# Patient Record
Sex: Male | Born: 1935
Health system: Southern US, Community
[De-identification: ages and names within clinical notes are randomized; demographics above are authoritative.]

## PROBLEM LIST (undated history)

## (undated) ENCOUNTER — Emergency Department (HOSPITAL_COMMUNITY): Payer: Medicare HMO | Source: Home / Self Care

## (undated) DIAGNOSIS — T8859XA Other complications of anesthesia, initial encounter: Secondary | ICD-10-CM

## (undated) DIAGNOSIS — K219 Gastro-esophageal reflux disease without esophagitis: Secondary | ICD-10-CM

## (undated) DIAGNOSIS — R112 Nausea with vomiting, unspecified: Secondary | ICD-10-CM

## (undated) DIAGNOSIS — J449 Chronic obstructive pulmonary disease, unspecified: Secondary | ICD-10-CM

## (undated) DIAGNOSIS — T4145XA Adverse effect of unspecified anesthetic, initial encounter: Secondary | ICD-10-CM

## (undated) DIAGNOSIS — I10 Essential (primary) hypertension: Secondary | ICD-10-CM

## (undated) DIAGNOSIS — S37069A Major laceration of unspecified kidney, initial encounter: Secondary | ICD-10-CM

## (undated) DIAGNOSIS — M1990 Unspecified osteoarthritis, unspecified site: Secondary | ICD-10-CM

## (undated) DIAGNOSIS — J069 Acute upper respiratory infection, unspecified: Secondary | ICD-10-CM

## (undated) DIAGNOSIS — I6529 Occlusion and stenosis of unspecified carotid artery: Secondary | ICD-10-CM

## (undated) DIAGNOSIS — E785 Hyperlipidemia, unspecified: Secondary | ICD-10-CM

## (undated) DIAGNOSIS — Z9889 Other specified postprocedural states: Secondary | ICD-10-CM

## (undated) DIAGNOSIS — R0602 Shortness of breath: Secondary | ICD-10-CM

## (undated) DIAGNOSIS — I639 Cerebral infarction, unspecified: Secondary | ICD-10-CM

## (undated) HISTORY — PX: JOINT REPLACEMENT: SHX530

## (undated) HISTORY — PX: SHOULDER SURGERY: SHX246

## (undated) HISTORY — PX: KIDNEY SURGERY: SHX687

## (undated) HISTORY — DX: Essential (primary) hypertension: I10

## (undated) HISTORY — PX: APPENDECTOMY: SHX54

## (undated) HISTORY — PX: ELBOW SURGERY: SHX618

## (undated) HISTORY — DX: Chronic obstructive pulmonary disease, unspecified: J44.9

## (undated) HISTORY — DX: Cerebral infarction, unspecified: I63.9

## (undated) HISTORY — DX: Hyperlipidemia, unspecified: E78.5

## (undated) HISTORY — DX: Occlusion and stenosis of unspecified carotid artery: I65.29

## (undated) HISTORY — DX: Gastro-esophageal reflux disease without esophagitis: K21.9

---

## 1997-09-26 ENCOUNTER — Emergency Department (HOSPITAL_COMMUNITY): Admission: EM | Admit: 1997-09-26 | Discharge: 1997-09-26 | Payer: Self-pay

## 1998-11-12 ENCOUNTER — Encounter: Payer: Self-pay | Admitting: Emergency Medicine

## 1998-11-12 ENCOUNTER — Inpatient Hospital Stay (HOSPITAL_COMMUNITY): Admission: EM | Admit: 1998-11-12 | Discharge: 1998-11-15 | Payer: Self-pay | Admitting: Emergency Medicine

## 1998-11-13 ENCOUNTER — Encounter: Payer: Self-pay | Admitting: Orthopedic Surgery

## 1999-02-17 ENCOUNTER — Encounter: Payer: Self-pay | Admitting: Orthopedic Surgery

## 1999-02-17 ENCOUNTER — Ambulatory Visit (HOSPITAL_COMMUNITY): Admission: RE | Admit: 1999-02-17 | Discharge: 1999-02-17 | Payer: Self-pay | Admitting: Orthopedic Surgery

## 1999-04-26 ENCOUNTER — Encounter: Payer: Self-pay | Admitting: Orthopedic Surgery

## 1999-04-26 ENCOUNTER — Ambulatory Visit (HOSPITAL_COMMUNITY): Admission: RE | Admit: 1999-04-26 | Discharge: 1999-04-26 | Payer: Self-pay | Admitting: Orthopedic Surgery

## 1999-06-28 ENCOUNTER — Encounter: Payer: Self-pay | Admitting: Orthopedic Surgery

## 1999-07-01 ENCOUNTER — Encounter: Payer: Self-pay | Admitting: Orthopedic Surgery

## 1999-07-01 ENCOUNTER — Inpatient Hospital Stay (HOSPITAL_COMMUNITY): Admission: RE | Admit: 1999-07-01 | Discharge: 1999-07-03 | Payer: Self-pay | Admitting: Orthopedic Surgery

## 1999-11-04 ENCOUNTER — Encounter (INDEPENDENT_AMBULATORY_CARE_PROVIDER_SITE_OTHER): Payer: Self-pay | Admitting: Specialist

## 1999-11-04 ENCOUNTER — Other Ambulatory Visit: Admission: RE | Admit: 1999-11-04 | Discharge: 1999-11-04 | Payer: Self-pay | Admitting: Internal Medicine

## 2001-01-23 ENCOUNTER — Ambulatory Visit (HOSPITAL_COMMUNITY): Admission: RE | Admit: 2001-01-23 | Discharge: 2001-01-23 | Payer: Self-pay | Admitting: Internal Medicine

## 2001-01-23 ENCOUNTER — Encounter: Payer: Self-pay | Admitting: Internal Medicine

## 2001-07-18 ENCOUNTER — Ambulatory Visit (HOSPITAL_COMMUNITY): Admission: RE | Admit: 2001-07-18 | Discharge: 2001-07-18 | Payer: Self-pay | Admitting: Orthopedic Surgery

## 2001-07-18 ENCOUNTER — Encounter: Payer: Self-pay | Admitting: Orthopedic Surgery

## 2004-09-29 ENCOUNTER — Ambulatory Visit: Payer: Self-pay | Admitting: Internal Medicine

## 2004-10-26 ENCOUNTER — Ambulatory Visit: Payer: Self-pay | Admitting: Internal Medicine

## 2006-10-18 ENCOUNTER — Encounter: Admission: RE | Admit: 2006-10-18 | Discharge: 2006-10-18 | Payer: Self-pay | Admitting: Internal Medicine

## 2007-04-19 ENCOUNTER — Encounter: Admission: RE | Admit: 2007-04-19 | Discharge: 2007-04-19 | Payer: Self-pay | Admitting: Internal Medicine

## 2008-02-06 ENCOUNTER — Encounter: Admission: RE | Admit: 2008-02-06 | Discharge: 2008-02-06 | Payer: Self-pay | Admitting: Interventional Radiology

## 2008-08-15 IMAGING — US EM OFFICE/OP CONSULT LEVEL 3 (40)
1 series · 13 of 16 positions shown · non-contrast
Comparison: none

[Series 1: em office/op consult level 3 (40) · 13 of 52 slices shown]
[im 1/52]
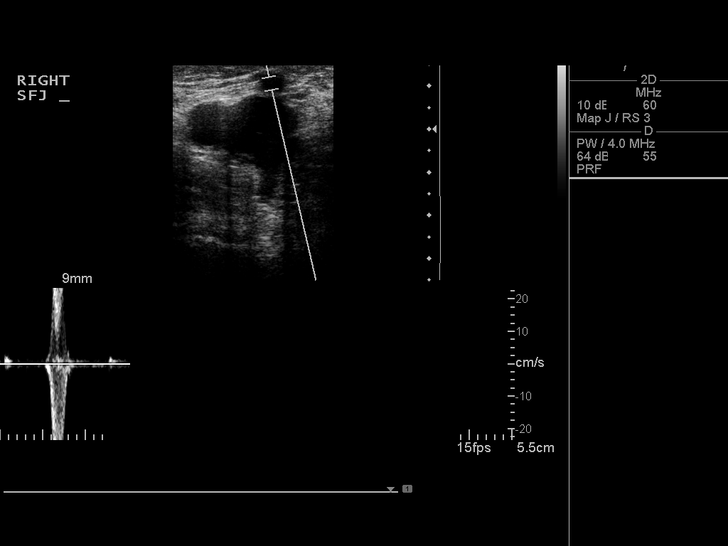
[im 4/52]
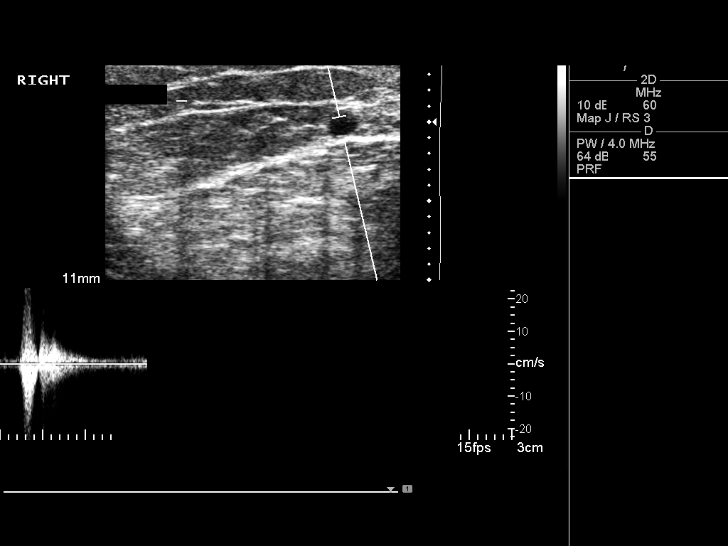
[im 11/52]
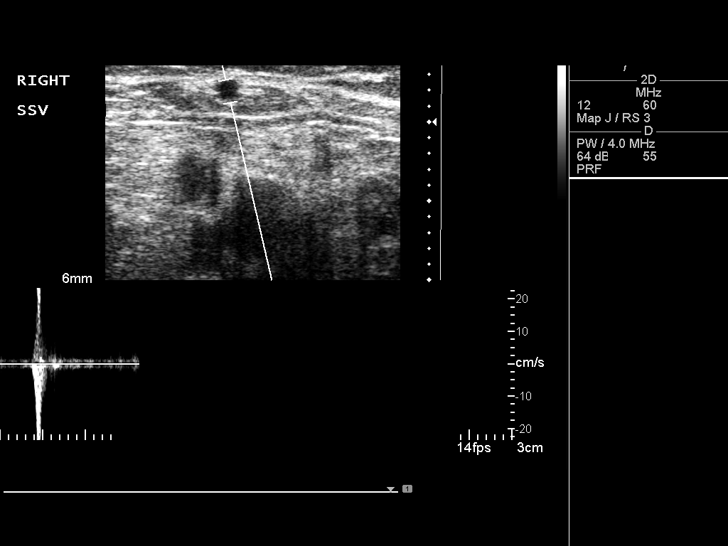
[im 14/52]
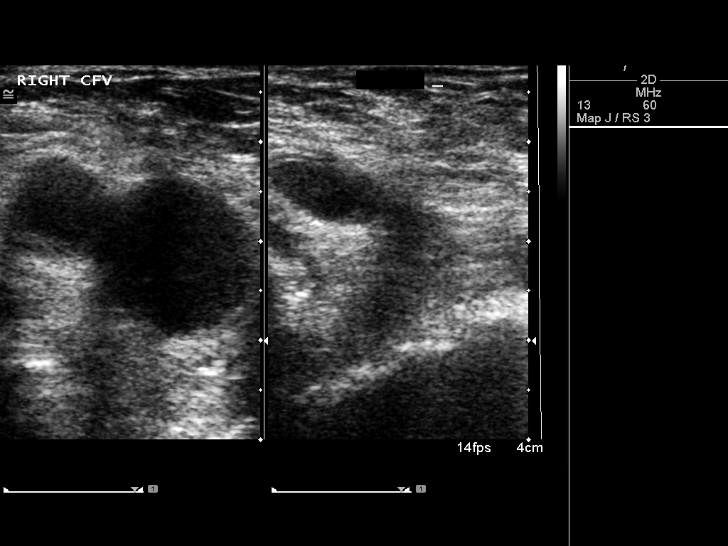
[im 18/52]
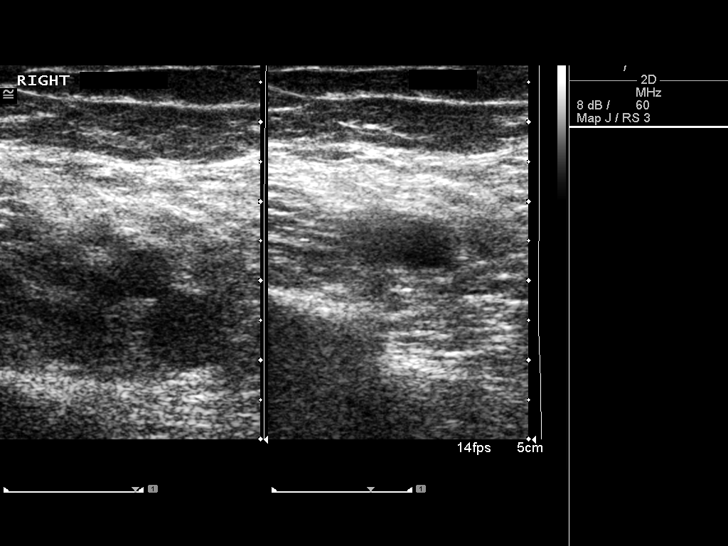
[im 21/52]
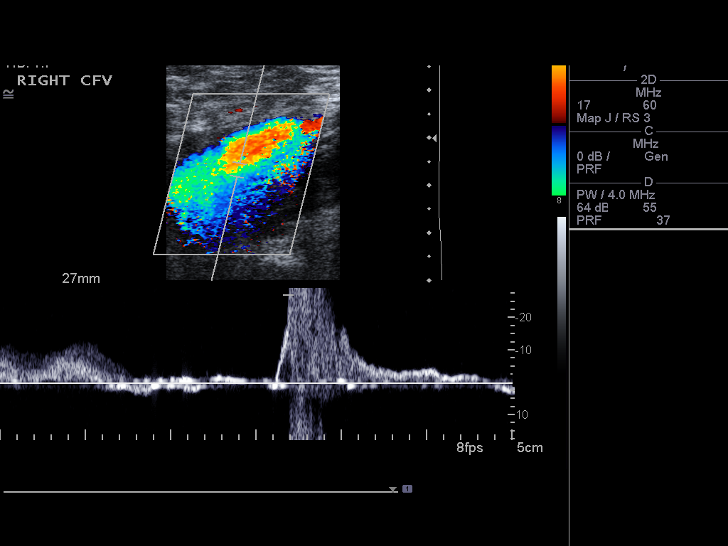
[im 28/52]
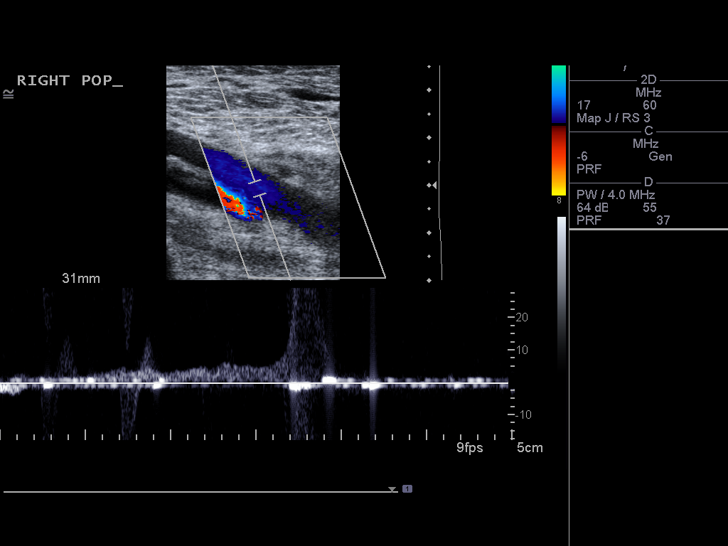
[im 31/52]
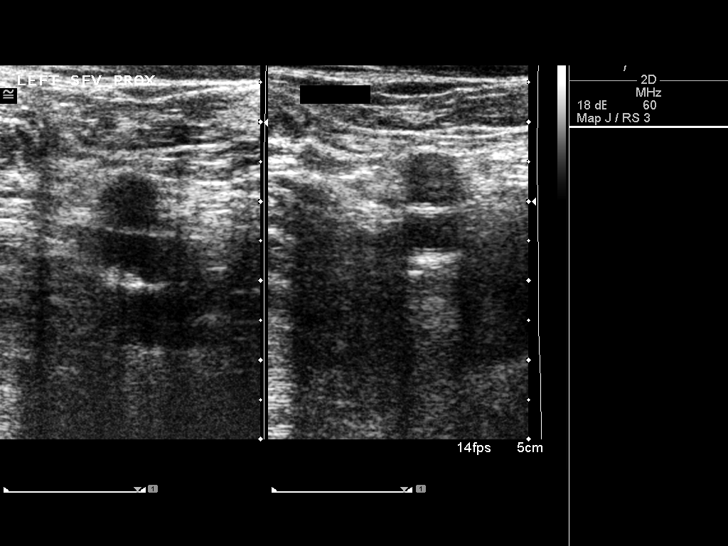
[im 35/52]
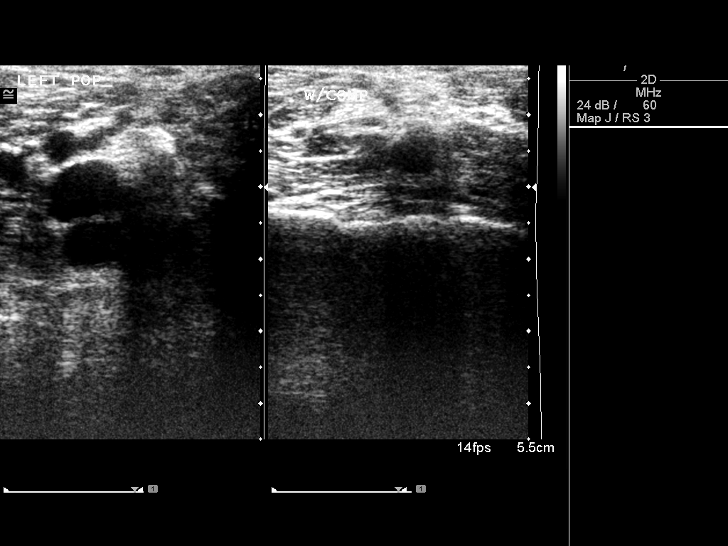
[im 38/52]
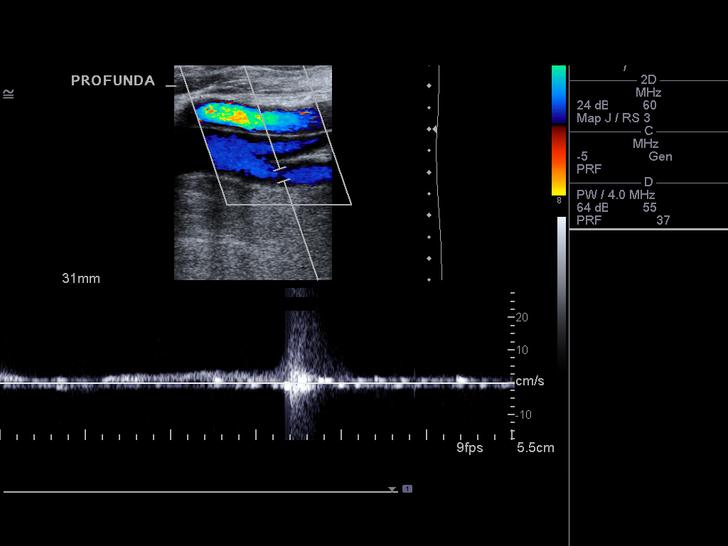
[im 41/52]
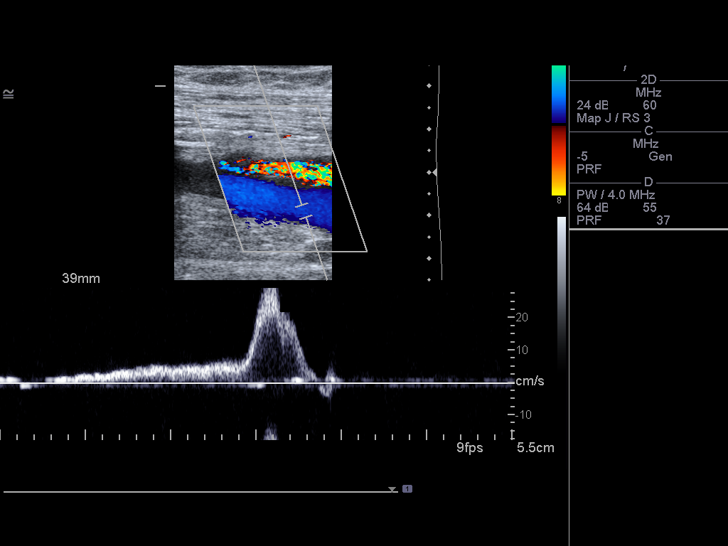
[im 48/52]
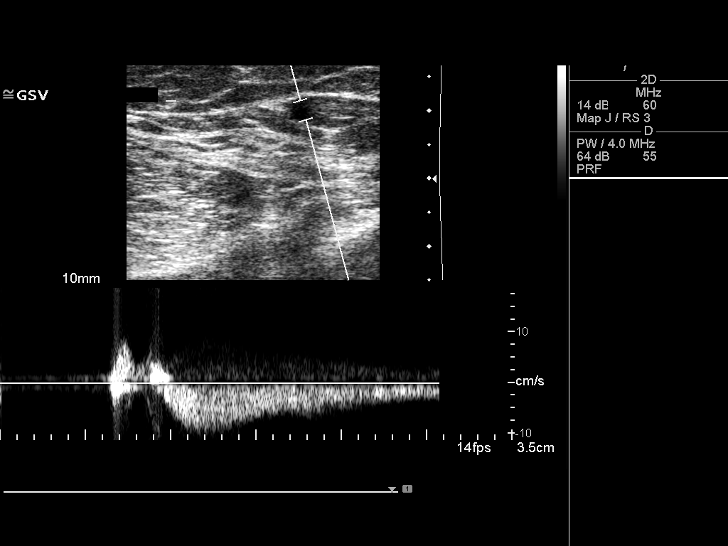
[im 52/52]
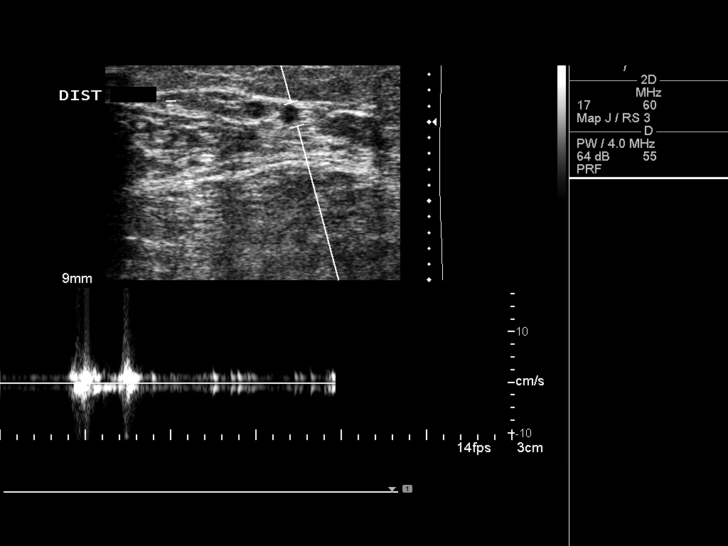

[13 of 16 positions shown; findings below may reference images not displayed]

Outpatient Consultation - [DATE]

Antonio F Negrini, M.D.
[address]

RE:  Lenard Fletes (DOB - [birth year])

Dear Dr. Tiger: 

Thanks for your referral of Mr. Ursula for evaluation and possible treatment for lower extremity varicose veins.  As you, Mr. Ursula is a pleasant 72 year old retired postal worker, who describes a several year history of a prominent varicose veins in the back of his right thigh.  The patient states that over the last couple of weeks, he has noticed a new prominent bulging vein in the medial aspect his left calf.  The patient denies symptoms related to the varicose veins.  The patient denies leg pain, swelling, or tiredness.  The patient states he occasionally has night cramps, but has had them all his life.  The patient?s primary concern is the possibility of blood clots in his legs.  

The patient underwent bilateral lower extremity ultrasounds which are dictated in a separate report.  In short, this shows extensive reflux in both lower extremities, involving both great saphenous veins, portions of both short saphenous veins, perforators in the calves bilaterally and actually also some reflux in the deep venous system in the left leg in the region of the distal femoral vein and popliteal vein.  Given the amount of reflux seen by ultrasound, I am surprised the patient is not more symptomatic.  

I discussed these results in depth with the patient and different options for treatment which include laser treatment or conservative management.  Given the patient?s extensive disease and the lack of symptoms, I recommended to him a watch-and-wait approach.  He would require extensive laser therapy of both great and short saphenous veins and still may continue to have problems related to perforator veins and the deep venous reflux in the left leg.  Therefore, I prescribed the [REDACTED] - 30 mm Hg graduated compression hose and told him to wear those as much as possible to help prevent progression of his disease.  I did instruct the patient to give us a call if he begins to have more symptoms including pain or leg swelling or worsening varicose veins.  

Again thank you for your referral of Mr. Ursula to me for evaluation and possible treatment.  On follow-up visits, if there is any concern for progression of disease or worsening symptoms, please do not hesitate to contact me as we can rediscuss treatment at that time.  

I spent approximately 45 minutes in direct consultation with the patient. 

Sincerely,

KGD:chc

## 2008-08-28 ENCOUNTER — Encounter: Admission: RE | Admit: 2008-08-28 | Discharge: 2008-08-28 | Payer: Self-pay | Admitting: Internal Medicine

## 2008-09-10 ENCOUNTER — Encounter: Admission: RE | Admit: 2008-09-10 | Discharge: 2008-09-10 | Payer: Self-pay | Admitting: Internal Medicine

## 2009-05-25 DIAGNOSIS — E785 Hyperlipidemia, unspecified: Secondary | ICD-10-CM | POA: Insufficient documentation

## 2009-05-25 DIAGNOSIS — I10 Essential (primary) hypertension: Secondary | ICD-10-CM | POA: Insufficient documentation

## 2009-09-21 ENCOUNTER — Encounter: Payer: Self-pay | Admitting: Internal Medicine

## 2009-10-23 ENCOUNTER — Encounter: Payer: Self-pay | Admitting: Internal Medicine

## 2010-02-15 ENCOUNTER — Encounter
Admission: RE | Admit: 2010-02-15 | Discharge: 2010-02-15 | Payer: Self-pay | Source: Home / Self Care | Attending: Internal Medicine | Admitting: Internal Medicine

## 2010-02-24 DIAGNOSIS — I69959 Hemiplegia and hemiparesis following unspecified cerebrovascular disease affecting unspecified side: Secondary | ICD-10-CM | POA: Insufficient documentation

## 2010-03-02 ENCOUNTER — Ambulatory Visit: Admit: 2010-03-02 | Payer: Self-pay | Attending: Vascular Surgery | Admitting: Vascular Surgery

## 2010-03-02 ENCOUNTER — Ambulatory Visit
Admission: RE | Admit: 2010-03-02 | Discharge: 2010-03-02 | Payer: Self-pay | Source: Home / Self Care | Attending: Vascular Surgery | Admitting: Vascular Surgery

## 2010-03-30 NOTE — Procedures (Signed)
Summary: Recall Assessment/[facility]  Recall Assessment/[facility]   Imported By: Sherian Rein 10/23/2009 10:50:27  _____________________________________________________________________  External Attachment:    Type:   Image     Comment:   External Document

## 2010-03-30 NOTE — Letter (Signed)
Summary: Colonoscopy Date Change Letter  [facility]  [address] [place], [place] [address]   Phone: [phone]  Fax: [phone]      October 23, 2009 MRN: [id number]   KIMO BANCROFT [address] [place], [place]  [address]   Dear Mr. GROLEAU,   Previously you were recommended to have a repeat colonoscopy around this time. Your chart was recently reviewed by DR.Jeanne Terrance of  Gastroenterology. Follow up colonoscopy is now recommended in 09-2014. This revised recommendation is based on current, nationally recognized guidelines for colorectal cancer screening and polyp surveillance. These guidelines are endorsed by the American Cancer Society, [employer] on Colorectal Cancer as well as numerous other major medical organizations.  Please understand that our recommendation assumes that you do not have any new symptoms such as bleeding, a change in bowel habits, anemia, or significant abdominal discomfort. If you do have any concerning GI symptoms or want to discuss the guideline recommendations, please call to arrange an office visit at your earliest convenience. Otherwise we will keep you in our reminder system and contact you 1-2 months prior to the date listed above to schedule your next colonoscopy.  Thank you,  Wilhemina Bonito. Marina Goodell, M.D.  [facility]astroenterology Division [phone]

## 2010-07-13 NOTE — Procedures (Signed)
CAROTID DUPLEX EXAM   INDICATION:  Carotid stenosis, per standing order.   HISTORY:  Diabetes:  No  Cardiac:  No  Hypertension:  Yes  Smoking:  Previous  Previous Surgery:  No  CV History:  History of CVA  Amaurosis Fugax No, Paresthesias No, Hemiparesis No                                       RIGHT             LEFT  Brachial systolic pressure:         120               120  Brachial Doppler waveforms:         Normal            Normal  Vertebral direction of flow:        Antegrade         Antegrade  DUPLEX VELOCITIES (cm/sec)  CCA peak systolic                   89                100  ECA peak systolic                   75                108  ICA peak systolic                   68                245  ICA end diastolic                   20                71  PLAQUE MORPHOLOGY:                  Heterogeneous     Mixed  PLAQUE AMOUNT:                      Mild              Moderate/severe  PLAQUE LOCATION:                    ICA/ECA           ICA/distal CCA   IMPRESSION:  1. Doppler velocities suggest a 60% to 79% stenosis at the left      proximal internal carotid artery.  2. No hemodynamically significant stenosis of the right internal      carotid artery with mild known irregular plaque noted as described      above.   ___________________________________________  Quita Skye. Hart Rochester, M.D.   CH/MEDQ  D:  03/03/2010  T:  03/03/2010  Job:  [id number]

## 2010-07-13 NOTE — Consult Note (Signed)
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  He awoke the following morning with weakness and  clumsiness in the left leg which has persisted but has slightly  improved.  He had no other neurologic symptoms which he noted.  He has  no history of hemiparesis, aphasia, amaurosis fugax, diplopia, blurred  vision, syncope or other neurologic symptoms or previous stroke.  Workup  included an MRI which revealed a stroke in the right parietal area and  carotid duplex studies were performed at [facility]  which I have reviewed and this revealed an approximate 70% to 75% left  internal carotid stenosis with mild flow reduction in the right internal  carotid.  He was referred for further evaluation.  He has no history of  previous neurologic symptoms.   CHRONIC MEDICAL PROBLEMS:  1. Hypertension.  2. Hyperlipidemia.  3. COPD.  4. GERD.  5. Negative for coronary artery disease or diabetes.   SOCIAL HISTORY:  The patient is married, has 1 child.  He is retired.  Does not use tobacco, has not in 20 years.  Drinks an occasional beer.   FAMILY HISTORY:  Positive for coronary artery disease in his mother.  Negative for diabetes and stroke.   REVIEW OF SYSTEMS:  Positive for decreasing visual acuity not  unilateral, joint pain, muscle pain, dyspnea on exertion and urinary  frequency.  All other systems negative on the complete review of  systems.   PHYSICAL EXAMINATION:  Vital signs:  Blood pressure 135/78, heart rate  68, respiration 20.  General:  This is a well-developed, well-nourished  male in no apparent distress, alert and oriented x3.  HEENT:  Exam is  normal for age.  EOMs intact.  Lungs:  Clear to auscultation.  No  rhonchi or wheezing.  Cardiovascular:  Regular  rhythm.  No murmurs.  Carotid pulses are 3+.  No bruits are audible.  Abdomen:  Soft,  nontender with no masses.  Musculoskeletal:  Free of major deformities.  Neurologic:  Reveals a very subtle weakness and discoordination of the  left lower extremity.  Lower extremity:  Exam reveals 3+ femoral and  posterior tibial pulses.  Skin:  Free of rashes.   Today I ordered a carotid duplex exam which I reviewed and interpreted.  I feel that he does have a moderate left internal carotid stenosis  approximating 70%.  I wanted to look at the morphology of the right  internal carotid plaque which is not flow reducing and does not appear  to be ulcerated or irregular.   IMPRESSION:  1. Recent right brain cerebrovascular accident not related to right      carotid disease which is quite mild.  2. Asymptomatic moderate left internal carotid stenosis.   I think he should continue his aspirin therapy as previously prescribed.  We will see him in 1 year with a followup carotid duplex exam to follow  his left side unless he develops any new symptoms in the interim.     Quita Skye Hart Rochester, M.D.  Electronically Signed   JDL/MEDQ  D:  03/02/2010  T:  03/02/2010  Job:  [id number]   cc:   Kari Baars, M.D.  Pramod P. Pearlean Brownie, MD

## 2010-07-16 NOTE — Op Note (Signed)
[place]  Patient:    Robert Bowers, Robert Bowers                        MRN: [id number] Proc. Date: 07/02/99 Adm. Date:  [id number] Disc. Date: [id number] Attending:  Burnard Bunting                           Operative Report  PREOPERATIVE DIAGNOSIS:  Left elbow pain with flexion contracture.  POSTOPERATIVE DIAGNOSIS:  Left elbow pain with flexion contracture.  OPERATION PERFORMED:  Left elbow examination under anesthesia, radial head excision and anterior capsule release and inspection of fractures and evaluation of fracture site.  SURGEON:  Cammy Copa, M.D.  ASSISTANT:  Nadara Mustard, M.D.  ANESTHESIA:  General endotracheal.  ESTIMATED BLOOD LOSS:  5 cc.  DRAINS:  None.  INDICATIONS FOR PROCEDURE:  The patient is a 76 year old patient who is now about 5-1/2 months out from left elbow and a radial head fracture.  He has had persistent elbow pain and has developed an elbow flexion contracture.  The patient has had lucency at the coranoid fracture site.  This has been evaluated by two CT scans which have demonstrated interval healing.  Injection into the elbow has given him less than complete pain relief.  OPERATIVE FINDINGS: 1. Preop range of motion 45 to 135 with 70 degree supination, 5 to 10 degrees    of pronation on the left elbow.  DESCRIPTION OF PROCEDURE:  The patient was brought to the operating room where general endotracheal anesthesia was induced.  Preoperative intravenous antibiotics were administered.  The left elbow was then prepped with Betadine and DuraPrep solution and covered with sterile Ioban.  The arm was elevated and exsanguinated with the Esmarch wrap and the tourniquet was inflated.  A lateral Kocher approach to the elbow was made.  Extensor mass was elevated off the lateral condyle.  The radial head was exposed.  Although the fracture had healed, the radial head was deformed and was mechanical block to pronation,  / the radial head was excised with an oscillating saw.  Two baby Bennett retractors were gently placed around the proximal radial neck.  The radial head was excised with an oscillating saw.  At this time an anterior capsular release was performed using a periosteal elevator.  The release was carried across the ulnar humeral joint over to the medial aspect of the elbow joint. The radial head removed.  The coranoid fracture line was inspected.  The elbow was taken through a range of motion and no movement at the fracture site was identified and the fracture site was probed and was found to have bone crossing the fracture site.  At this point the posterior aspect of the elbow joint was inspected.  There was noted to be scar tissue within the olecranon fossa.  This was removed.  The tip of the olecranon was also removed which was preventing full extension.  After these maneuvers were then performed, the patient could achieve improved flexion contracture preoperatively from 45 to about to 5 to 10 degrees against gravity.  The arm could be made straight with gentle pressure applied over the proximal forearm.  Pronation and supination was full.  Intraoperative fluoroscopy demonstrated excision of the radial head and no change in the fracture fragment position.  The incision was then thoroughly irrigated with 2L of irrigating solution.  The Alpha 2000 Marcaine pump  was placed into the joint.  The extensor tendon mass was then reapproximated to the lateral epicondyle using interrupted 0 Vicryl figure-of-eight sutures.  The skin and subcutaneous tissue was then reapproximated using interrupted 3-0 Vicryl suture followed by interrupted simple Prolene suture.  The patient was then placed in an elbow splint with the arm in full extension.  Tourniquet was released after the cast padding was applied.  Postoperatively the patient was noted to have intact posterior interosseous nerve function and a perfused  hand.  The patient tolerated the procedure well without immediate complications. DD:  07/02/99 TD:  07/05/99 Job: [id number]

## 2010-08-17 ENCOUNTER — Ambulatory Visit: Payer: Medicare Other | Attending: Specialist

## 2010-08-17 DIAGNOSIS — M25659 Stiffness of unspecified hip, not elsewhere classified: Secondary | ICD-10-CM | POA: Insufficient documentation

## 2010-08-17 DIAGNOSIS — IMO0001 Reserved for inherently not codable concepts without codable children: Secondary | ICD-10-CM | POA: Insufficient documentation

## 2010-08-17 DIAGNOSIS — M545 Low back pain, unspecified: Secondary | ICD-10-CM | POA: Insufficient documentation

## 2010-08-17 DIAGNOSIS — R5381 Other malaise: Secondary | ICD-10-CM | POA: Insufficient documentation

## 2010-08-27 ENCOUNTER — Ambulatory Visit: Payer: Medicare Other | Admitting: Physical Therapy

## 2010-09-20 DIAGNOSIS — M549 Dorsalgia, unspecified: Secondary | ICD-10-CM | POA: Insufficient documentation

## 2011-05-31 ENCOUNTER — Ambulatory Visit: Payer: Medicare Other | Admitting: Vascular Surgery

## 2011-05-31 ENCOUNTER — Other Ambulatory Visit: Payer: Medicare Other

## 2011-06-03 ENCOUNTER — Encounter: Payer: Self-pay | Admitting: Vascular Surgery

## 2011-06-06 ENCOUNTER — Encounter: Payer: Self-pay | Admitting: Neurosurgery

## 2011-06-07 ENCOUNTER — Ambulatory Visit (INDEPENDENT_AMBULATORY_CARE_PROVIDER_SITE_OTHER): Payer: Medicare Other | Admitting: Neurosurgery

## 2011-06-07 ENCOUNTER — Ambulatory Visit (INDEPENDENT_AMBULATORY_CARE_PROVIDER_SITE_OTHER): Payer: Medicare Other | Admitting: *Deleted

## 2011-06-07 ENCOUNTER — Encounter: Payer: Self-pay | Admitting: Neurosurgery

## 2011-06-07 VITALS — BP 125/72 | HR 67 | Resp 16 | Ht 72.0 in | Wt 180.7 lb

## 2011-06-07 DIAGNOSIS — I6529 Occlusion and stenosis of unspecified carotid artery: Secondary | ICD-10-CM

## 2011-06-07 NOTE — Progress Notes (Signed)
[facility] HISTORY AND PHYSICAL   CC: Annual carotid duplex exam known carotid stenosis Referring Physician: Hart Rochester  History of Present Illness: This 77 year old patient of Dr. Hart Rochester seen for his known carotid stenosis. Last duplex was January 2012. Patient did have a CVA not due to carotid stenosis in January 2011. Does note practically full recovery except for some minor problems with his left lower extremity from time to time. 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  Past Medical History  Diagnosis Date  . Hypertension   . Hyperlipidemia   . Stroke   . Carotid artery occlusion   . COPD (chronic obstructive pulmonary disease)   . GERD (gastroesophageal reflux disease)     ROS: [x]  Positive   [ ]  Denies    General: [ ]  Weight loss, [ ]  Fever, [ ]  chills Neurologic: [ ]  Dizziness, [ ]  Blackouts, [ ]  Seizure [ ]  Stroke, [ ]  "Mini stroke", [ ]  Slurred speech, [ ]  Temporary blindness; [ ]  weakness in arms or legs, [ ]  Hoarseness Cardiac: [ ]  Chest pain/pressure, [ ]  Shortness of breath at rest [ ]  Shortness of breath with exertion, [ ]  Atrial fibrillation or irregular heartbeat Vascular: [ ]  Pain in legs with walking, [ ]  Pain in legs at rest, [ ]  Pain in legs at night,  [ ]  Non-healing ulcer, [ ]  Blood clot in vein/DVT,   Pulmonary: [ ]  Home oxygen, [ ]  Productive cough, [ ]  Coughing up blood, [ ]  Asthma,  [ ]  Wheezing Musculoskeletal:  [ ]  Arthritis, [ ]  Low back pain, [ ]  Joint pain Hematologic: [ ]  Easy Bruising, [ ]  Anemia; [ ]  Hepatitis Gastrointestinal: [ ]  Blood in stool, [ ]  Gastroesophageal Reflux/heartburn, [ ]  Trouble swallowing Urinary: [ ]  chronic Kidney disease, [ ]  on HD - [ ]  MWF or [ ]  TTHS, [ ]  Burning with urination, [ ]  Difficulty urinating Skin: [ ]  Rashes, [ ]  Wounds Psychological: [ ]  Anxiety, [ ]  Depression   Social  History History  Substance Use Topics  . Smoking status: Former Smoker    Quit date: 06/03/1991  . Smokeless tobacco: Not on file  . Alcohol Use: No    Family History Family History  Problem Relation Age of Onset  . Heart disease Mother     Heart Disease before age 57  . Hypertension Mother     No Known Allergies  Current Outpatient Prescriptions  Medication Sig Dispense Refill  . amLODipine (NORVASC) 5 MG tablet Take 5 mg by mouth daily.      Marland Kitchen aspirin 325 MG tablet Take 325 mg by mouth daily.      Marland Kitchen omeprazole (PRILOSEC) 20 MG capsule Take 20 mg by mouth daily.      Marland Kitchen tiotropium (SPIRIVA) 18 MCG inhalation capsule Place 18 mcg into inhaler and inhale daily.      . benazepril (LOTENSIN) 20 MG tablet Take 20 mg by mouth daily.      Marland Kitchen ibuprofen (ADVIL,MOTRIN) 200 MG tablet Take 200 mg by mouth every 6 (six) hours as needed.      . sildenafil (VIAGRA) 100 MG tablet Take 50 mg by mouth daily as needed.        Physical Examination  Filed Vitals:   06/07/11 1415  BP: 125/72  Pulse: 67  Resp: 16    Body mass index is  24.51 kg/(m^2).  General:  WDWN in NAD Gait: Normal HEENT: WNL Eyes: Pupils equal Pulmonary: normal non-labored breathing , without Rales, rhonchi,  wheezing Cardiac: RRR, without  Murmurs, rubs or gallops; Abdomen: soft, NT, no masses Skin: no rashes, ulcers noted  Vascular Exam Pulses: The patient has 2+ radial pulses bilaterally Carotid bruits are not audible to auscultation, he does have audible pulses bilaterally in the carotids Extremities without ischemic changes, no Gangrene , no cellulitis; no open wounds;  Musculoskeletal: no muscle wasting or atrophy   Neurologic: A&O X 3; Appropriate Affect ; SENSATION: normal; MOTOR FUNCTION:  moving all extremities equally. Speech is fluent/normal  Non-Invasive Vascular Imaging CAROTID DUPLEX 06/07/2011  Right ICA 20 - 39 % stenosis Left ICA 80 - 99 % stenosis January 2012 carotid duplex shows 139% on  the right, 60-79% on the left which is increased significantly.  ASSESSMENT/PLAN: Assessment above, Dr. Hart Rochester spoke with the patient and his wife who agree the patient will have a left carotid endarterectomy May 1 with Dr. Hart Rochester. Their questions were encouraged and answered in the surgery schedulers will take care of his surgery set up.  Webb Silversmith ANP   Clinic MD: Hart Rochester

## 2011-06-15 ENCOUNTER — Encounter (HOSPITAL_COMMUNITY): Payer: Self-pay | Admitting: Pharmacy Technician

## 2011-06-15 ENCOUNTER — Other Ambulatory Visit: Payer: Self-pay | Admitting: *Deleted

## 2011-06-21 ENCOUNTER — Encounter (HOSPITAL_COMMUNITY)
Admission: RE | Admit: 2011-06-21 | Discharge: 2011-06-21 | Disposition: A | Discharge status: Home / Self Care | Payer: Medicare Other | Source: Ambulatory Visit | Attending: Anesthesiology | Admitting: Anesthesiology

## 2011-06-21 ENCOUNTER — Encounter (HOSPITAL_COMMUNITY)
Admission: RE | Admit: 2011-06-21 | Discharge: 2011-06-21 | Disposition: A | Discharge status: Home / Self Care | Payer: Medicare Other | Source: Ambulatory Visit | Attending: Vascular Surgery | Admitting: Vascular Surgery

## 2011-06-21 ENCOUNTER — Encounter (HOSPITAL_COMMUNITY): Payer: Self-pay

## 2011-06-21 ENCOUNTER — Other Ambulatory Visit (HOSPITAL_COMMUNITY): Payer: Medicare Other

## 2011-06-21 HISTORY — DX: Shortness of breath: R06.02

## 2011-06-21 HISTORY — DX: Adverse effect of unspecified anesthetic, initial encounter: T41.45XA

## 2011-06-21 HISTORY — DX: Other specified postprocedural states: Z98.890

## 2011-06-21 HISTORY — DX: Acute upper respiratory infection, unspecified: J06.9

## 2011-06-21 HISTORY — DX: Other complications of anesthesia, initial encounter: T88.59XA

## 2011-06-21 HISTORY — DX: Other specified postprocedural states: R11.2

## 2011-06-21 LAB — CBC
HCT: 44.6 % (ref 39.0–52.0)
Hemoglobin: 15.4 g/dL (ref 13.0–17.0)
MCH: 29 pg (ref 26.0–34.0)
MCHC: 34.5 g/dL (ref 30.0–36.0)
RBC: 5.31 MIL/uL (ref 4.22–5.81)

## 2011-06-21 LAB — COMPREHENSIVE METABOLIC PANEL
ALT: 27 U/L (ref 0–53)
Alkaline Phosphatase: 97 U/L (ref 39–117)
BUN: 11 mg/dL (ref 6–23)
CO2: 28 mEq/L (ref 19–32)
Calcium: 9.8 mg/dL (ref 8.4–10.5)
GFR calc Af Amer: 69 mL/min — ABNORMAL LOW (ref >90)
GFR calc non Af Amer: 60 mL/min — ABNORMAL LOW (ref >90)
Glucose, Bld: 99 mg/dL (ref 70–99)
Potassium: 3.7 mEq/L (ref 3.5–5.1)
Sodium: 141 mEq/L (ref 135–145)
Total Protein: 7.8 g/dL (ref 6.0–8.3)

## 2011-06-21 LAB — URINALYSIS, ROUTINE W REFLEX MICROSCOPIC
Bilirubin Urine: NEGATIVE
Glucose, UA: NEGATIVE mg/dL
Hgb urine dipstick: NEGATIVE
Ketones, ur: NEGATIVE mg/dL
Protein, ur: NEGATIVE mg/dL
pH: 6.5 (ref 5.0–8.0)

## 2011-06-21 LAB — ABO/RH: ABO/RH(D): O POS

## 2011-06-21 LAB — APTT: aPTT: 29 seconds (ref 24–37)

## 2011-06-21 LAB — TYPE AND SCREEN: ABO/RH(D): O POS

## 2011-06-21 LAB — PROTIME-INR: Prothrombin Time: 13.1 seconds (ref 11.6–15.2)

## 2011-06-21 NOTE — Pre-Procedure Instructions (Signed)
Robert Bowers  06/21/2011   Your procedure is scheduled on:  Wednesday, May 1st @8 :30AM.  Report to [facility] at 6:30 AM.  Call this number if you have problems the morning of surgery: [phone]   Remember:   Do not eat food:After Midnight.  May have clear liquids: up to 4 Hours before arrival( nothing after 2:30AM).  Clear liquids include soda, tea, black coffee, apple or grape juice, broth.  Take these medicines the morning of surgery with A SIP OF WATER: Amlodipine, Aspirin, Bromday eye drops, Durezol eye drops,  Omeprazole.   Do not wear jewelry, make-up or nail polish.  Do not wear lotions, powders, or perfumes. You may wear deodorant.  Do not shave 48 hours prior to surgery.  Do not bring valuables to the hospital.  Contacts, dentures or bridgework may not be worn into surgery.  Leave suitcase in the car. After surgery it may be brought to your room.  For patients admitted to the hospital, checkout time is 11:00 AM the day of discharge.   Patients discharged the day of surgery will not be allowed to drive home.  Name and phone number of your driver: Robert Bowers,XBJY([phone]).  Special Instructions: CHG Shower Use Special Wash: 1/2 bottle night before surgery and 1/2 bottle morning of surgery.   Please read over the following fact sheets that you were given: Pain Booklet, Coughing and Deep Breathing, Blood Transfusion Information and Surgical Site Infection Prevention

## 2011-06-21 NOTE — Procedures (Unsigned)
CAROTID DUPLEX EXAM  INDICATION:  Carotid stenosis.  HISTORY: Diabetes:  No. Cardiac:  No. Hypertension:  Yes. Smoking:  Previous. Previous Surgery:  No. CV History:  Right brain CVA on 02/14/2010. Amaurosis Fugax No, Paresthesias No, Hemiparesis No.                                      RIGHT             LEFT Brachial systolic pressure:         122               114 Brachial Doppler waveforms:         Normal            Normal Vertebral direction of flow:        Antegrade         Antegrade DUPLEX VELOCITIES (cm/sec) CCA peak systolic                   97                92 ECA peak systolic                   123               120 ICA peak systolic                   83                330 ICA end diastolic                   21                121 PLAQUE MORPHOLOGY:                  Heterogenous      Heterogenous PLAQUE AMOUNT:                      Mild              Severe PLAQUE LOCATION:                    ICA/ECA           ICA/ECA  IMPRESSION: 1. Doppler velocities suggest an 80% to 99% stenosis of the left     proximal internal carotid artery. 2. Doppler velocities suggest no hemodynamically significant stenosis     of the right internal carotid artery with plaque formations as     described above. 3. Significant increase in the velocities of the left internal carotid     artery when compared to the previous examination on 03/02/2010 with     the right internal carotid artery remaining stable.  ___________________________________________ Quita Skye. Hart Rochester, M.D.  CH/MEDQ  D:  06/08/2011  T:  06/08/2011  Job:  [id number]

## 2011-06-21 NOTE — Progress Notes (Signed)
Denies Stress test in last 32yrs. And is not followed by cardiologist.  Denies having sleep study and ever being dx'd w/ sleep apnea.//L. Dee Paden,RN

## 2011-06-21 NOTE — Progress Notes (Signed)
Pt reports having EKG the 1st of this year.  Requested EKG from [phone]).  Pt reports having CXR last July but has had a bout of Bronchitis in3/2013.  Requested cxr from Dr. Martha Clan @[facility](#[phone], [phone]). Since he had bronchitis last month, had pt have CXR at PAT appt.//L. Adiya Selmer,RN

## 2011-06-28 MED ORDER — DEXTROSE 5 % IV SOLN
1.5000 g | INTRAVENOUS | Status: AC
  Administered 2011-06-29: 1.5 g via INTRAVENOUS
  Filled 2011-06-28: qty 1.5

## 2011-06-29 ENCOUNTER — Encounter (HOSPITAL_COMMUNITY)
Admission: RE | Disposition: A | Discharge status: Home / Self Care | Payer: Self-pay | Source: Ambulatory Visit | Attending: Vascular Surgery

## 2011-06-29 ENCOUNTER — Encounter (HOSPITAL_COMMUNITY): Payer: Self-pay | Admitting: Anesthesiology

## 2011-06-29 ENCOUNTER — Encounter (HOSPITAL_COMMUNITY): Payer: Self-pay | Admitting: *Deleted

## 2011-06-29 ENCOUNTER — Ambulatory Visit (HOSPITAL_COMMUNITY): Payer: Medicare Other | Admitting: Anesthesiology

## 2011-06-29 ENCOUNTER — Inpatient Hospital Stay (HOSPITAL_COMMUNITY)
Admission: RE | Admit: 2011-06-29 | Discharge: 2011-06-30 | DRG: 039 | Disposition: A | Discharge status: Home / Self Care | Payer: Medicare Other | Source: Ambulatory Visit | Attending: Vascular Surgery | Admitting: Vascular Surgery

## 2011-06-29 DIAGNOSIS — I6529 Occlusion and stenosis of unspecified carotid artery: Principal | ICD-10-CM | POA: Diagnosis present

## 2011-06-29 DIAGNOSIS — Z8249 Family history of ischemic heart disease and other diseases of the circulatory system: Secondary | ICD-10-CM

## 2011-06-29 DIAGNOSIS — E785 Hyperlipidemia, unspecified: Secondary | ICD-10-CM | POA: Diagnosis present

## 2011-06-29 DIAGNOSIS — I10 Essential (primary) hypertension: Secondary | ICD-10-CM | POA: Diagnosis present

## 2011-06-29 DIAGNOSIS — Z79899 Other long term (current) drug therapy: Secondary | ICD-10-CM

## 2011-06-29 DIAGNOSIS — Z7982 Long term (current) use of aspirin: Secondary | ICD-10-CM

## 2011-06-29 DIAGNOSIS — Z8673 Personal history of transient ischemic attack (TIA), and cerebral infarction without residual deficits: Secondary | ICD-10-CM

## 2011-06-29 DIAGNOSIS — J4489 Other specified chronic obstructive pulmonary disease: Secondary | ICD-10-CM | POA: Diagnosis present

## 2011-06-29 DIAGNOSIS — Z87891 Personal history of nicotine dependence: Secondary | ICD-10-CM

## 2011-06-29 DIAGNOSIS — J449 Chronic obstructive pulmonary disease, unspecified: Secondary | ICD-10-CM | POA: Diagnosis present

## 2011-06-29 DIAGNOSIS — K219 Gastro-esophageal reflux disease without esophagitis: Secondary | ICD-10-CM | POA: Diagnosis present

## 2011-06-29 HISTORY — PX: ENDARTERECTOMY: SHX5162

## 2011-06-29 SURGERY — ENDARTERECTOMY, CAROTID
Anesthesia: General | Site: Neck | Laterality: Left | Wound class: Clean

## 2011-06-29 MED ORDER — ASPIRIN 325 MG PO TABS
325.0000 mg | ORAL_TABLET | Freq: Every day | ORAL | Status: DC
  Administered 2011-06-30: 325 mg via ORAL
  Filled 2011-06-29 (×2): qty 1

## 2011-06-29 MED ORDER — LOSARTAN POTASSIUM 50 MG PO TABS
50.0000 mg | ORAL_TABLET | Freq: Every day | ORAL | Status: DC
  Administered 2011-06-30: 50 mg via ORAL
  Filled 2011-06-29 (×2): qty 1

## 2011-06-29 MED ORDER — DOPAMINE-DEXTROSE 3.2-5 MG/ML-% IV SOLN: 3.0000 ug/kg/min | INTRAVENOUS | Status: DC

## 2011-06-29 MED ORDER — SODIUM CHLORIDE 0.9 % IR SOLN
Status: DC | PRN
  Administered 2011-06-29: 09:00:00

## 2011-06-29 MED ORDER — POTASSIUM CHLORIDE CRYS ER 20 MEQ PO TBCR: 20.0000 meq | EXTENDED_RELEASE_TABLET | Freq: Once | ORAL | Status: AC | PRN

## 2011-06-29 MED ORDER — SCOPOLAMINE 1 MG/3DAYS TD PT72
MEDICATED_PATCH | TRANSDERMAL | Status: DC | PRN
  Administered 2011-06-29: 1 via TRANSDERMAL

## 2011-06-29 MED ORDER — PROMETHAZINE HCL 25 MG/ML IJ SOLN: 6.2500 mg | INTRAMUSCULAR | Status: DC | PRN

## 2011-06-29 MED ORDER — NEOSTIGMINE METHYLSULFATE 1 MG/ML IJ SOLN
INTRAMUSCULAR | Status: DC | PRN
  Administered 2011-06-29: 3 mg via INTRAVENOUS

## 2011-06-29 MED ORDER — PANTOPRAZOLE SODIUM 40 MG PO TBEC: 40.0000 mg | DELAYED_RELEASE_TABLET | Freq: Every day | ORAL | Status: DC

## 2011-06-29 MED ORDER — PROPOFOL 10 MG/ML IV EMUL
INTRAVENOUS | Status: DC | PRN
  Administered 2011-06-29: 200 mg via INTRAVENOUS

## 2011-06-29 MED ORDER — ONDANSETRON HCL 4 MG/2ML IJ SOLN: 4.0000 mg | Freq: Four times a day (QID) | INTRAMUSCULAR | Status: DC | PRN

## 2011-06-29 MED ORDER — DEXTROSE 5 % IV SOLN
1.5000 g | Freq: Two times a day (BID) | INTRAVENOUS | Status: AC
  Administered 2011-06-29 – 2011-06-30 (×2): 1.5 g via INTRAVENOUS
  Filled 2011-06-29 (×2): qty 1.5

## 2011-06-29 MED ORDER — PHENOL 1.4 % MT LIQD: 1.0000 | OROMUCOSAL | Status: DC | PRN

## 2011-06-29 MED ORDER — PROTAMINE SULFATE 10 MG/ML IV SOLN
INTRAVENOUS | Status: DC | PRN
  Administered 2011-06-29: 10 mg via INTRAVENOUS
  Administered 2011-06-29: 20 mg via INTRAVENOUS
  Administered 2011-06-29 (×2): 10 mg via INTRAVENOUS

## 2011-06-29 MED ORDER — ALUM & MAG HYDROXIDE-SIMETH 200-200-20 MG/5ML PO SUSP: 15.0000 mL | ORAL | Status: DC | PRN

## 2011-06-29 MED ORDER — HEPARIN SODIUM (PORCINE) 1000 UNIT/ML IJ SOLN
INTRAMUSCULAR | Status: DC | PRN
  Administered 2011-06-29: 6000 [IU] via INTRAVENOUS

## 2011-06-29 MED ORDER — AMLODIPINE BESYLATE 5 MG PO TABS
5.0000 mg | ORAL_TABLET | Freq: Every day | ORAL | Status: DC
Start: 2011-06-29 — End: 2011-06-30
  Administered 2011-06-30: 5 mg via ORAL
  Filled 2011-06-29 (×2): qty 1

## 2011-06-29 MED ORDER — BROMFENAC SODIUM (ONCE-DAILY) 0.09 % OP SOLN: 1.0000 [drp] | Freq: Every day | OPHTHALMIC | Status: DC

## 2011-06-29 MED ORDER — GUAIFENESIN-DM 100-10 MG/5ML PO SYRP: 15.0000 mL | ORAL_SOLUTION | ORAL | Status: DC | PRN

## 2011-06-29 MED ORDER — METOPROLOL TARTRATE 1 MG/ML IV SOLN: 2.0000 mg | INTRAVENOUS | Status: DC | PRN

## 2011-06-29 MED ORDER — TRAMADOL HCL 50 MG PO TABS: 50.0000 mg | ORAL_TABLET | Freq: Four times a day (QID) | ORAL | Status: DC | PRN

## 2011-06-29 MED ORDER — SODIUM CHLORIDE 0.9 % IV SOLN: 500.0000 mL | Freq: Once | INTRAVENOUS | Status: AC | PRN

## 2011-06-29 MED ORDER — ACETAMINOPHEN 325 MG PO TABS: 325.0000 mg | ORAL_TABLET | ORAL | Status: DC | PRN

## 2011-06-29 MED ORDER — MEPERIDINE HCL 25 MG/ML IJ SOLN: 6.2500 mg | INTRAMUSCULAR | Status: DC | PRN

## 2011-06-29 MED ORDER — HYDROMORPHONE HCL PF 1 MG/ML IJ SOLN
0.2500 mg | INTRAMUSCULAR | Status: DC | PRN
  Administered 2011-06-29 (×3): 0.25 mg via INTRAVENOUS

## 2011-06-29 MED ORDER — HYDRALAZINE HCL 20 MG/ML IJ SOLN: 10.0000 mg | INTRAMUSCULAR | Status: DC | PRN

## 2011-06-29 MED ORDER — ACETAMINOPHEN 650 MG RE SUPP: 325.0000 mg | RECTAL | Status: DC | PRN

## 2011-06-29 MED ORDER — ROCURONIUM BROMIDE 100 MG/10ML IV SOLN
INTRAVENOUS | Status: DC | PRN
  Administered 2011-06-29: 50 mg via INTRAVENOUS

## 2011-06-29 MED ORDER — FENTANYL CITRATE 0.05 MG/ML IJ SOLN
INTRAMUSCULAR | Status: DC | PRN
  Administered 2011-06-29: 100 ug via INTRAVENOUS

## 2011-06-29 MED ORDER — SCOPOLAMINE 1 MG/3DAYS TD PT72
1.0000 | MEDICATED_PATCH | Freq: Once | TRANSDERMAL | Status: DC
  Filled 2011-06-29: qty 1

## 2011-06-29 MED ORDER — MORPHINE SULFATE 2 MG/ML IJ SOLN
2.0000 mg | INTRAMUSCULAR | Status: DC | PRN
  Administered 2011-06-29 – 2011-06-30 (×3): 2 mg via INTRAVENOUS
  Filled 2011-06-29 (×3): qty 1

## 2011-06-29 MED ORDER — SODIUM CHLORIDE 0.9 % IV SOLN: INTRAVENOUS | Status: DC

## 2011-06-29 MED ORDER — ONDANSETRON HCL 4 MG/2ML IJ SOLN
INTRAMUSCULAR | Status: DC | PRN
  Administered 2011-06-29: 4 mg via INTRAVENOUS

## 2011-06-29 MED ORDER — SIMVASTATIN 5 MG PO TABS
5.0000 mg | ORAL_TABLET | Freq: Every day | ORAL | Status: DC
  Filled 2011-06-29 (×2): qty 1

## 2011-06-29 MED ORDER — ASPIRIN EC 325 MG PO TBEC: 325.0000 mg | DELAYED_RELEASE_TABLET | Freq: Every day | ORAL | Status: DC

## 2011-06-29 MED ORDER — BROMFENAC SODIUM (ONCE-DAILY) 0.09 % OP SOLN
1.0000 [drp] | Freq: Every day | OPHTHALMIC | Status: DC
  Administered 2011-06-30: 1 [drp] via OPHTHALMIC
  Filled 2011-06-29 (×2): qty 0.1

## 2011-06-29 MED ORDER — DIFLUPREDNATE 0.05 % OP EMUL
1.0000 [drp] | Freq: Two times a day (BID) | OPHTHALMIC | Status: DC
  Administered 2011-06-29 – 2011-06-30 (×2): 1 [drp] via OPHTHALMIC
  Filled 2011-06-29 (×5): qty 0.1

## 2011-06-29 MED ORDER — GLYCOPYRROLATE 0.2 MG/ML IJ SOLN
INTRAMUSCULAR | Status: DC | PRN
  Administered 2011-06-29: .6 mg via INTRAVENOUS

## 2011-06-29 MED ORDER — LACTATED RINGERS IV SOLN
INTRAVENOUS | Status: DC | PRN
  Administered 2011-06-29 (×2): via INTRAVENOUS

## 2011-06-29 MED ORDER — DOCUSATE SODIUM 100 MG PO CAPS
100.0000 mg | ORAL_CAPSULE | Freq: Every day | ORAL | Status: DC
  Administered 2011-06-30: 100 mg via ORAL
  Filled 2011-06-29: qty 1

## 2011-06-29 MED ORDER — LABETALOL HCL 5 MG/ML IV SOLN: 10.0000 mg | INTRAVENOUS | Status: DC | PRN

## 2011-06-29 MED ORDER — SODIUM CHLORIDE 0.9 % IR SOLN
Status: DC | PRN
  Administered 2011-06-29: 1000 mL

## 2011-06-29 MED ORDER — SODIUM CHLORIDE 0.9 % IV SOLN
INTRAVENOUS | Status: DC
  Administered 2011-06-29: 125 mL via INTRAVENOUS

## 2011-06-29 MED ORDER — LIDOCAINE HCL (CARDIAC) 20 MG/ML IV SOLN
INTRAVENOUS | Status: DC | PRN
  Administered 2011-06-29: 50 mg via INTRAVENOUS

## 2011-06-29 MED ORDER — MAGNESIUM SULFATE 40 MG/ML IJ SOLN
2.0000 g | Freq: Once | INTRAMUSCULAR | Status: AC | PRN
  Filled 2011-06-29: qty 50

## 2011-06-29 MED ORDER — DROPERIDOL 2.5 MG/ML IJ SOLN
INTRAMUSCULAR | Status: DC | PRN
  Administered 2011-06-29: 0.625 mg via INTRAVENOUS

## 2011-06-29 SURGICAL SUPPLY — 43 items
CANISTER SUCTION 2500CC (MISCELLANEOUS) ×2 IMPLANT
CATH ROBINSON RED A/P 18FR (CATHETERS) ×2 IMPLANT
CATH SUCT 10FR WHISTLE TIP (CATHETERS) ×2 IMPLANT
CLIP TI MEDIUM 24 (CLIP) ×2 IMPLANT
CLIP TI WIDE RED SMALL 24 (CLIP) ×2 IMPLANT
CLOTH BEACON ORANGE TIMEOUT ST (SAFETY) ×2 IMPLANT
COVER SURGICAL LIGHT HANDLE (MISCELLANEOUS) ×4 IMPLANT
CRADLE DONUT ADULT HEAD (MISCELLANEOUS) ×2 IMPLANT
DECANTER SPIKE VIAL GLASS SM (MISCELLANEOUS) IMPLANT
DRAIN HEMOVAC 1/8 X 5 (WOUND CARE) IMPLANT
DRAPE WARM FLUID 44X44 (DRAPE) ×2 IMPLANT
DRSG COVADERM 4X6 (GAUZE/BANDAGES/DRESSINGS) ×1 IMPLANT
ELECT REM PT RETURN 9FT ADLT (ELECTROSURGICAL) ×2
ELECTRODE REM PT RTRN 9FT ADLT (ELECTROSURGICAL) ×1 IMPLANT
EVACUATOR SILICONE 100CC (DRAIN) IMPLANT
GLOVE BIO SURGEON STRL SZ7 (GLOVE) ×1 IMPLANT
GLOVE BIOGEL PI IND STRL 7.0 (GLOVE) IMPLANT
GLOVE BIOGEL PI IND STRL 7.5 (GLOVE) IMPLANT
GLOVE BIOGEL PI INDICATOR 7.0 (GLOVE) ×1
GLOVE BIOGEL PI INDICATOR 7.5 (GLOVE) ×1
GLOVE SS BIOGEL STRL SZ 7 (GLOVE) ×1 IMPLANT
GLOVE SUPERSENSE BIOGEL SZ 7 (GLOVE) ×1
GLOVE SURG SS PI 7.5 STRL IVOR (GLOVE) ×1 IMPLANT
GOWN STRL NON-REIN LRG LVL3 (GOWN DISPOSABLE) ×4 IMPLANT
INSERT FOGARTY SM (MISCELLANEOUS) ×2 IMPLANT
KIT BASIN OR (CUSTOM PROCEDURE TRAY) ×2 IMPLANT
KIT ROOM TURNOVER OR (KITS) ×2 IMPLANT
NEEDLE 22X1 1/2 (OR ONLY) (NEEDLE) IMPLANT
NS IRRIG 1000ML POUR BTL (IV SOLUTION) ×4 IMPLANT
PACK CAROTID (CUSTOM PROCEDURE TRAY) ×2 IMPLANT
PAD ARMBOARD 7.5X6 YLW CONV (MISCELLANEOUS) ×4 IMPLANT
PATCH HEMASHIELD 8X75 (Vascular Products) ×1 IMPLANT
SHUNT CAROTID BYPASS 12FRX15.5 (VASCULAR PRODUCTS) IMPLANT
SPECIMEN JAR SMALL (MISCELLANEOUS) ×2 IMPLANT
SUT PROLENE 6 0 CC (SUTURE) ×3 IMPLANT
SUT SILK 2 0 FS (SUTURE) ×2 IMPLANT
SUT VIC AB 2-0 CT1 27 (SUTURE) ×2
SUT VIC AB 2-0 CT1 TAPERPNT 27 (SUTURE) ×1 IMPLANT
SUT VIC AB 3-0 X1 27 (SUTURE) ×2 IMPLANT
SYR CONTROL 10ML LL (SYRINGE) IMPLANT
TOWEL OR 17X24 6PK STRL BLUE (TOWEL DISPOSABLE) ×2 IMPLANT
TOWEL OR 17X26 10 PK STRL BLUE (TOWEL DISPOSABLE) ×2 IMPLANT
WATER STERILE IRR 1000ML POUR (IV SOLUTION) ×2 IMPLANT

## 2011-06-29 NOTE — Anesthesia Procedure Notes (Signed)
Procedure Name: Intubation Date/Time: 06/29/2011 8:40 AM Performed by: Gwenyth Allegra Pre-anesthesia Checklist: Patient identified, Timeout performed, Emergency Drugs available, Suction available and Patient being monitored Patient Re-evaluated:Patient Re-evaluated prior to inductionOxygen Delivery Method: Circle system utilized Preoxygenation: Pre-oxygenation with 100% oxygen Intubation Type: IV induction Ventilation: Mask ventilation without difficulty and Oral airway inserted - appropriate to patient size Laryngoscope Size: Mac and 4 Grade View: Grade I Tube type: Oral Tube size: 8.0 mm Airway Equipment and Method: Stylet Placement Confirmation: ETT inserted through vocal cords under direct vision,  breath sounds checked- equal and bilateral and positive ETCO2 Secured at: 22 cm Tube secured with: Tape Dental Injury: Teeth and Oropharynx as per pre-operative assessment

## 2011-06-29 NOTE — Anesthesia Preprocedure Evaluation (Signed)
Anesthesia Evaluation  Patient identified by MRN, date of birth, ID band Patient awake    Reviewed: Allergy & Precautions, H&P , NPO status , Patient's Chart, lab work & pertinent test results  History of Anesthesia Complications (+) PONV  Airway Mallampati: II TM Distance: >3 FB Neck ROM: Full    Dental  (+) Teeth Intact   Pulmonary shortness of breath and with exertion, COPD COPD inhaler, Recent URI , Resolved,  breath sounds clear to auscultation  Pulmonary exam normal + decreased breath sounds      Cardiovascular hypertension, Pt. on medications Rhythm:Regular Rate:Normal     Neuro/Psych CVA, No Residual Symptoms    GI/Hepatic Neg liver ROS, GERD-  Medicated and Controlled,  Endo/Other  negative endocrine ROS  Renal/GU negative Renal ROS  negative genitourinary   Musculoskeletal negative musculoskeletal ROS (+)   Abdominal (+) - obese,  Abdomen: soft.    Peds  Hematology negative hematology ROS (+)   Anesthesia Other Findings   Reproductive/Obstetrics negative OB ROS                           Anesthesia Physical Anesthesia Plan  ASA: III  Anesthesia Plan: General   Post-op Pain Management:    Induction: Intravenous  Airway Management Planned: Oral ETT  Additional Equipment: Arterial line  Intra-op Plan:   Post-operative Plan: Extubation in OR  Informed Consent: I have reviewed the patients History and Physical, chart, labs and discussed the procedure including the risks, benefits and alternatives for the proposed anesthesia with the patient or authorized representative who has indicated his/her understanding and acceptance.   Dental advisory given  Plan Discussed with: CRNA, Anesthesiologist and Surgeon  Anesthesia Plan Comments:         Anesthesia Quick Evaluation

## 2011-06-29 NOTE — Progress Notes (Signed)
Pt arrived from PACU, VSS, Neuro intact, family at bedside, pain level stated 2/10 more uncomfortable and sore than anything per pt. Oriented to unit and routine. Call bell within reach. Offered ice and elink notified of arrival.

## 2011-06-29 NOTE — Op Note (Signed)
OPERATIVE REPORT  Date of Surgery: 06/29/2011  Surgeon: Josephina Gip, MD  Assistant: Lianne Cure pa  Pre-op Diagnosis: LEFT ICA STENOSIS-severe Post-op Diagnosis: same Procedure: Procedure(s): Left carotid endarterectomy with background patch angioplasty  Anesthesia: Gen. endotracheal  EBL: Minimal  Complications: None  Procedure Details: OPERATIVE REPORT  Date of Surgery: 06/29/2011  Surgeon: Josephina Gip, MD  Assistant: Lianne Cure PA  Pre-op Diagnosis: LEFT ICA STENOSIS-severe-asymptomatic Postop diagnosis same   Procedure: Procedure(s): Left carotid endarterectomy with background patch angioplasty  Anesthesia: General  EBL: 100 cc  Complications: None  Procedure Details: The patient was taken to the operating room and placed in the supine position. Following induction of satisfactory general endotracheal anesthesia the left neck was prepped and draped in a routine sterile manner. Incision was made on the anterior border of the sternocleidomastoid muscle and carried down through the subcutaneous tissue and platysma using the Bovie. Care was taken not to injure the hypoglossal nerve.. The common internal and external carotid arteries were dissected free. There was a calcified atherosclerotic plaque at the carotid bifurcation extending up the internal carotid artery. A #10 shunt was then prepared and the patient was heparinized. The carotid vessels were occluded with vascular clamps. A longitudinal opening was made in the common carotid with a 15 blade extended up the internal carotid with the Potts scissors to a point distal to the disease. The plaque was approximately 90 % stenotic in severity. The distal vessel appeared normal. Shunt was inserted without difficulty reestablishing flow in about 2 minutes. A standard endarterectomy was performed with an eversion endarterectomy of the external carotid. The plaque feathered off  the distal internal carotid artery nicely  not requiring any tacking sutures. The lumen was thoroughly irrigated with heparinized saline and loose debris all carefully removed. The arterotomy was then closed with a patch using continuous 6-0 Prolene. Prior to completion of the  Closure the  shunt was removed after approximately 30 minutes of shunt time. Flow was then reestablished up the external branch initially followed by the internal branch. Protamine was given to her reverse the heparin.Following adequate hemostasis the wound was irrigated with saline and closed in layers with Vicryl ain a subcuticular fashion. Sterile dressing was applied and the patient taken to the recovery room in stable condition.  Josephina Gip, MD 06/29/2011 10:25 AM        Josephina Gip, MD 06/29/2011 10:20 AM

## 2011-06-29 NOTE — Interval H&P Note (Signed)
History and Physical Interval Note:  06/29/2011 7:54 AM  Robert Bowers  has presented today for surgery, with the diagnosis of LEFT ICA STENOSIS  The various methods of treatment have been discussed with the patient and family. After consideration of risks, benefits and other options for treatment, the patient has consented to  Procedure(s) (LRB): ENDARTERECTOMY CAROTID (Left) as a surgical intervention .  The patients' history has been reviewed, patient examined, no change in status, stable for surgery.  I have reviewed the patients' chart and labs.  Questions were answered to the patient's satisfaction.     Josephina Gip

## 2011-06-29 NOTE — Progress Notes (Signed)
ANTIBIOTIC CONSULT NOTE - INITIAL  Pharmacy Consult for adjusting antibiotics for renal function Indication: post-op prophylaxis  Allergies  Allergen Reactions  . Vicodin (Hydrocodone-Acetaminophen) Other (See Comments)    Hallucinations.  . Tape Itching and Rash    Paper Tape is okay.    Patient Measurements: Weight: 179 lb 3.7 oz (81.3 kg) (transcribed from 4/23)  Labs: 4/23 SCr 1.17, CrCl >30 ml/min No results found for this basename: WBC:3,HGB:3,PLT:3,LABCREA:3,CREATININE:3 in the last 72 hours The CrCl is unknown because both a height and weight (above a minimum accepted value) are required for this calculation. No results found for this basename: VANCOTROUGH:2,VANCOPEAK:2,VANCORANDOM:2,GENTTROUGH:2,GENTPEAK:2,GENTRANDOM:2,TOBRATROUGH:2,TOBRAPEAK:2,TOBRARND:2,AMIKACINPEAK:2,AMIKACINTROU:2,AMIKACIN:2, in the last 72 hours   Microbiology: Recent Results (from the past 720 hour(s))  SURGICAL PCR SCREEN     Status: Normal   Collection Time   06/21/11  4:00 PM      Component Value Range Status Comment   MRSA, PCR NEGATIVE  NEGATIVE  Final    Staphylococcus aureus NEGATIVE  NEGATIVE  Final    Assessment: 77 yo male to receive Zinacef for post-op prophylaxis s/p L CEA. Zinacef dosing is appropriate for renal function.  Plan:  Zinacef as previously ordered Pharmacy signing off, please re-consult if needed  [facility], Dora.D., BCPS Clinical Pharmacist Pager: [phone] 06/29/2011 1:10 PM

## 2011-06-29 NOTE — Anesthesia Postprocedure Evaluation (Signed)
  Anesthesia Post-op Note  Patient: Robert Bowers  Procedure(s) Performed: Procedure(s) (LRB): ENDARTERECTOMY CAROTID (Left)  Patient Location: PACU  Anesthesia Type: General  Level of Consciousness: awake, alert  and oriented  Airway and Oxygen Therapy: Patient Spontanous Breathing  Post-op Pain: mild  Post-op Assessment: Post-op Vital signs reviewed, Patient's Cardiovascular Status Stable, Respiratory Function Stable, Patent Airway, No signs of Nausea or vomiting, Adequate PO intake and Pain level controlled  Post-op Vital Signs: Reviewed and stable  Complications: No apparent anesthesia complications

## 2011-06-29 NOTE — Preoperative (Signed)
Beta Blockers   Reason not to administer Beta Blockers:Not Applicable 

## 2011-06-29 NOTE — H&P (View-Only) (Signed)
[facility] [place] HISTORY AND PHYSICAL   CC: Annual carotid duplex exam known carotid stenosis Referring Physician: Lawson  History of Present Illness: This 76-year-old patient of Dr. Lawson seen for his known carotid stenosis. Last duplex was January 2012. Patient did have a CVA not due to carotid stenosis in January 2011. Does note practically full recovery except for some minor problems with his left lower extremity from time to time. 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  Past Medical History  Diagnosis Date  . Hypertension   . Hyperlipidemia   . Stroke   . Carotid artery occlusion   . COPD (chronic obstructive pulmonary disease)   . GERD (gastroesophageal reflux disease)     ROS: [x] Positive   [ ] Denies    General: [ ] Weight loss, [ ] Fever, [ ] chills Neurologic: [ ] Dizziness, [ ] Blackouts, [ ] Seizure [ ] Stroke, [ ] "Mini stroke", [ ] Slurred speech, [ ] Temporary blindness; [ ] weakness in arms or legs, [ ] Hoarseness Cardiac: [ ] Chest pain/pressure, [ ] Shortness of breath at rest [ ] Shortness of breath with exertion, [ ] Atrial fibrillation or irregular heartbeat Vascular: [ ] Pain in legs with walking, [ ] Pain in legs at rest, [ ] Pain in legs at night,  [ ] Non-healing ulcer, [ ] Blood clot in vein/DVT,   Pulmonary: [ ] Home oxygen, [ ] Productive cough, [ ] Coughing up blood, [ ] Asthma,  [ ] Wheezing Musculoskeletal:  [ ] Arthritis, [ ] Low back pain, [ ] Joint pain Hematologic: [ ] Easy Bruising, [ ] Anemia; [ ] Hepatitis Gastrointestinal: [ ] Blood in stool, [ ] Gastroesophageal Reflux/heartburn, [ ] Trouble swallowing Urinary: [ ] chronic Kidney disease, [ ] on HD - [ ] MWF or [ ] TTHS, [ ] Burning with urination, [ ] Difficulty urinating Skin: [ ] Rashes, [ ] Wounds Psychological: [ ] Anxiety, [ ] Depression   Social  History History  Substance Use Topics  . Smoking status: Former Smoker    Quit date: 06/03/1991  . Smokeless tobacco: Not on file  . Alcohol Use: No    Family History Family History  Problem Relation Age of Onset  . Heart disease Mother     Heart Disease before age 60  . Hypertension Mother     No Known Allergies  Current Outpatient Prescriptions  Medication Sig Dispense Refill  . amLODipine (NORVASC) 5 MG tablet Take 5 mg by mouth daily.      . aspirin 325 MG tablet Take 325 mg by mouth daily.      . omeprazole (PRILOSEC) 20 MG capsule Take 20 mg by mouth daily.      . tiotropium (SPIRIVA) 18 MCG inhalation capsule Place 18 mcg into inhaler and inhale daily.      . benazepril (LOTENSIN) 20 MG tablet Take 20 mg by mouth daily.      . ibuprofen (ADVIL,MOTRIN) 200 MG tablet Take 200 mg by mouth every 6 (six) hours as needed.      . sildenafil (VIAGRA) 100 MG tablet Take 50 mg by mouth daily as needed.        Physical Examination  Filed Vitals:   06/07/11 1415  BP: 125/72  Pulse: 67  Resp: 16    Body mass index is   24.51 kg/(m^2).  General:  WDWN in NAD Gait: Normal HEENT: WNL Eyes: Pupils equal Pulmonary: normal non-labored breathing , without Rales, rhonchi,  wheezing Cardiac: RRR, without  Murmurs, rubs or gallops; Abdomen: soft, NT, no masses Skin: no rashes, ulcers noted  Vascular Exam Pulses: The patient has 2+ radial pulses bilaterally Carotid bruits are not audible to auscultation, he does have audible pulses bilaterally in the carotids Extremities without ischemic changes, no Gangrene , no cellulitis; no open wounds;  Musculoskeletal: no muscle wasting or atrophy   Neurologic: A&O X 3; Appropriate Affect ; SENSATION: normal; MOTOR FUNCTION:  moving all extremities equally. Speech is fluent/normal  Non-Invasive Vascular Imaging CAROTID DUPLEX 06/07/2011  Right ICA 20 - 39 % stenosis Left ICA 80 - 99 % stenosis January 2012 carotid duplex shows 139% on  the right, 60-79% on the left which is increased significantly.  ASSESSMENT/PLAN: Assessment above, Dr. Lawson spoke with the patient and his wife who agree the patient will have a left carotid endarterectomy May 1 with Dr. Lawson. Their questions were encouraged and answered in the surgery schedulers will take care of his surgery set up.  [name] ANP   Clinic MD: Lawson 

## 2011-06-29 NOTE — OR Nursing (Signed)
Pre operative assessment completed by Atha Starks RN, Documented in chart by J. WelchRN, verbal report given.

## 2011-06-29 NOTE — Transfer of Care (Signed)
Immediate Anesthesia Transfer of Care Note  Patient: Robert Bowers  Procedure(s) Performed: Procedure(s) (LRB): ENDARTERECTOMY CAROTID (Left)  Patient Location: PACU  Anesthesia Type: General  Level of Consciousness: awake  Airway & Oxygen Therapy: Patient Spontanous Breathing and Patient connected to nasal cannula oxygen  Post-op Assessment: Report given to PACU RN  Post vital signs: Reviewed and stable  Complications: No apparent anesthesia complications

## 2011-06-30 ENCOUNTER — Encounter (HOSPITAL_COMMUNITY): Payer: Self-pay | Admitting: Vascular Surgery

## 2011-06-30 ENCOUNTER — Telehealth: Payer: Self-pay | Admitting: Vascular Surgery

## 2011-06-30 LAB — CBC
Hemoglobin: 12.4 g/dL — ABNORMAL LOW (ref 13.0–17.0)
MCH: 28.6 pg (ref 26.0–34.0)
MCHC: 33.5 g/dL (ref 30.0–36.0)
Platelets: 126 10*3/uL — ABNORMAL LOW (ref 150–400)
RBC: 4.33 MIL/uL (ref 4.22–5.81)

## 2011-06-30 LAB — BASIC METABOLIC PANEL
Calcium: 8.7 mg/dL (ref 8.4–10.5)
GFR calc non Af Amer: 67 mL/min — ABNORMAL LOW (ref >90)
Glucose, Bld: 110 mg/dL — ABNORMAL HIGH (ref 70–99)
Potassium: 3.7 mEq/L (ref 3.5–5.1)
Sodium: 140 mEq/L (ref 135–145)

## 2011-06-30 MED ORDER — TRAMADOL HCL 50 MG PO TABS: 50.0000 mg | ORAL_TABLET | Freq: Four times a day (QID) | ORAL | Status: AC | PRN

## 2011-06-30 NOTE — Discharge Summary (Signed)
[facility] Discharge Summary   Patient ID:  Robert Bowers MRN: [id number] DOB/AGE: 09/09/1934 77 y.o.  Admit date: 06/29/2011 Discharge date: 06/30/2011 Date of Surgery: 06/29/2011 Surgeon: Surgeon(s): Pryor Ochoa, MD  Admission Diagnosis:  L ICA STENOSIS  Discharge Diagnoses:   L ICA STENOSIS  Secondary Diagnoses: Past Medical History  Diagnosis Date  . Hypertension   . Hyperlipidemia   . Stroke   . Carotid artery occlusion   . COPD (chronic obstructive pulmonary disease)   . GERD (gastroesophageal reflux disease)   . Complication of anesthesia     difficulty awakening from anesthesia, & N & V  . PONV (postoperative nausea and vomiting)   . Shortness of breath     at times due to COPD.  Marland Kitchen Recurrent upper respiratory infection (URI)     last bout of bronchitis 04/2011.    Procedure(s): ENDARTERECTOMY CAROTID  Discharged Condition: good  HPI: This 76 year old patient of Dr. Hart Rochester seen for his known carotid stenosis. Last duplex was January 2012. Patient did have a CVA not due to carotid stenosis in January 2011. Does note practically full recovery except for some minor problems with his left lower extremity from time to time. 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    Hospital Course:  Robert Bowers is a 77 y.o. male is S/P Left Procedure(s): ENDARTERECTOMY CAROTID.  His stay was uneventful.  He is walking and voiding independently using a urinal at bedside.  Taking PO's well. Extubated: POD # 0 Post-op wounds healing well Pt. Ambulating, voiding and taking PO diet without difficulty. Pt pain controlled with PO pain meds. Labs as below Complications:none  Consults:     Significant Diagnostic Studies: CBC Lab Results  Component Value Date   WBC 7.2 06/30/2011   HGB 12.4* 06/30/2011   HCT 37.0* 06/30/2011   MCV 85.5 06/30/2011   PLT 126*  06/30/2011    BMET    Component Value Date/Time   NA 140 06/30/2011 0405   K 3.7 06/30/2011 0405   CL 107 06/30/2011 0405   CO2 26 06/30/2011 0405   GLUCOSE 110* 06/30/2011 0405   BUN 11 06/30/2011 0405   CREATININE 1.06 06/30/2011 0405   CALCIUM 8.7 06/30/2011 0405   GFRNONAA 67* 06/30/2011 0405   GFRAA 78* 06/30/2011 0405   COAG Lab Results  Component Value Date   INR 0.97 06/21/2011     Disposition:  Discharge to :Home Discharge Orders    Future Orders Please Complete By Expires   Resume previous diet      Driving Restrictions      Comments:   No driving for 2 weeks   Lifting restrictions      Comments:   No lifting for 6 weeks   Call MD for:  temperature >100.5      Call MD for:  redness, tenderness, or signs of infection (pain, swelling, bleeding, redness, odor or green/yellow discharge around incision site)      Call MD for:  severe or increased pain, loss or decreased feeling  in affected limb(s)      Increase activity slowly      Comments:   Walk with assistance use walker or cane as needed   May shower       Scheduling Instructions:   Shower tomorrow with soap and water daily      Robert Bowers  E  Home Medication Instructions AVW:[id number]   Printed on:06/30/11 0746  Medication Information                    ibuprofen (ADVIL,MOTRIN) 200 MG tablet Take 200 mg by mouth every 6 (six) hours as needed. For pain           omeprazole (PRILOSEC) 20 MG capsule Take 20 mg by mouth daily.           sildenafil (VIAGRA) 100 MG tablet Take 50 mg by mouth daily as needed. ED           amLODipine (NORVASC) 5 MG tablet Take 5 mg by mouth daily.           aspirin 325 MG tablet Take 325 mg by mouth daily.           losartan (COZAAR) 100 MG tablet Take 50 mg by mouth daily.           pravastatin (PRAVACHOL) 40 MG tablet Take 40 mg by mouth at bedtime.           Bromfenac Sodium (BROMDAY) 0.09 % SOLN Place 1 drop into the right eye daily.           Difluprednate (DUREZOL) 0.05  % EMUL Place 1 drop into the right eye 2 (two) times daily.           traMADol (ULTRAM) 50 MG tablet Take 1 tablet (50 mg total) by mouth every 6 (six) hours as needed for pain.            Verbal and written Discharge instructions given to the patient. Wound care per Discharge AVS F/U in 2 weeks with Dr. Hart Rochester  Signed: [name], [name] [facility] 06/30/2011, 7:46 AM

## 2011-06-30 NOTE — Telephone Encounter (Signed)
On 06/30/11 per staff message from judy and emma collins--pt needs a 2 wk fu w/ JDL for l carotid endarterectomy. I called pt and LM at 1:47pm regarding this appt and I mailed an appt letter as well. Robert Bowers

## 2011-06-30 NOTE — Progress Notes (Signed)
[facility] - OP CEA PROGRESS NOTE  Date of Surgery: 06/29/2011 Surgeon: Surgeon(s): Pryor Ochoa, MD 1 Day Post-Op left Carotid Endarterectomy .  HPI: Robert Bowers is a 77 y.o. male who is 1 Day Post-Op left Carotid Endarterectomy . Patient is doing well. Pre-operative symptoms are Improved Patient denies headache; Patient denies difficulty swallowing; denies weakness in upper or lower extremities; Pt. denies other symptoms of stroke or TIA.  IMAGING: No results found.  Significant Diagnostic Studies: CBC Lab Results  Component Value Date   WBC 7.2 06/30/2011   HGB 12.4* 06/30/2011   HCT 37.0* 06/30/2011   MCV 85.5 06/30/2011   PLT 126* 06/30/2011    BMET    Component Value Date/Time   NA 140 06/30/2011 0405   K 3.7 06/30/2011 0405   CL 107 06/30/2011 0405   CO2 26 06/30/2011 0405   GLUCOSE 110* 06/30/2011 0405   BUN 11 06/30/2011 0405   CREATININE 1.06 06/30/2011 0405   CALCIUM 8.7 06/30/2011 0405   GFRNONAA 67* 06/30/2011 0405   GFRAA 78* 06/30/2011 0405    COAG Lab Results  Component Value Date   INR 0.97 06/21/2011   No results found for this basename: PTT      Intake/Output Summary (Last 24 hours) at 06/30/11 0742 Last data filed at 06/30/11 0700  Gross per 24 hour  Intake 4260.41 ml  Output    825 ml  Net 3435.41 ml    Physical Exam:  BP Readings from Last 3 Encounters:  06/30/11 134/119  06/30/11 134/119  06/21/11 156/75   Temp Readings from Last 3 Encounters:  06/30/11 99 F (37.2 C) Oral  06/30/11 99 F (37.2 C) Oral  06/21/11 97 F (36.1 C)    SpO2 Readings from Last 3 Encounters:  06/30/11 98%  06/30/11 98%  06/21/11 97%   Pulse Readings from Last 3 Encounters:  06/30/11 74  06/30/11 74  06/21/11 77    Pt is A&O x 3 Gait is normal Speech is normal left Neck Wound is clean, dry, intact or healing well Patient with Negative tongue deviation and Negative facial droop Pt has good and equal strength in all  extremities  Assessment: Robert Bowers is a 77 y.o. male is S/P Left Carotid endarterectomy Pt is voiding, ambulating and taking po well   Plan: Discharge to: Home Follow-up in 2 weeks   Clinton Gallant [facility] [phone] 06/30/2011 7:42 AM

## 2011-06-30 NOTE — Progress Notes (Signed)
UR COMPLETED  

## 2011-07-18 ENCOUNTER — Encounter: Payer: Self-pay | Admitting: Vascular Surgery

## 2011-07-19 ENCOUNTER — Encounter: Payer: Self-pay | Admitting: Vascular Surgery

## 2011-07-19 ENCOUNTER — Ambulatory Visit (INDEPENDENT_AMBULATORY_CARE_PROVIDER_SITE_OTHER): Payer: Medicare Other | Admitting: Vascular Surgery

## 2011-07-19 VITALS — BP 166/85 | HR 72 | Resp 18 | Ht 72.0 in | Wt 168.0 lb

## 2011-07-19 DIAGNOSIS — I6529 Occlusion and stenosis of unspecified carotid artery: Secondary | ICD-10-CM

## 2011-07-19 DIAGNOSIS — Z48812 Encounter for surgical aftercare following surgery on the circulatory system: Secondary | ICD-10-CM

## 2011-07-19 NOTE — Progress Notes (Signed)
Subjective:     Patient ID: Robert Bowers, male   DOB: 10-05-1934, 77 y.o.   MRN: [id number]  HPI this 77 year old male returns for followup regarding his left carotid endarterectomy which I performed 2 weeks ago. He was found to have an asymptomatic severe left internal carotid stenosis. He's done well since his surgery denying any transient weakness on the right side of his body, aphasia, amaurosis fugax, diplopia, blurred vision, or syncope. He is swallowing well and has no hoarseness. He has noticed some very mild weakness in the left lower lip area which is improving. He is taking aspirin   Review of Systems     Objective:   Physical ExamBP 166/85  Pulse 72  Resp 18  Ht 6' (1.829 m)  Wt 168 lb (76.204 kg)  BMI 22.78 kg/m2  General well-developed well-nourished male in no apparent distress Neurologic exam mild left marginal mandibular nerve paresis Left neck incision healing nicely 3+ carotid pulses no audible bruits     Assessment:     Doing well 2 weeks post left carotid and arterectomy for severe asymptomatic stenosis-mild right internal carotid stenosis    Plan:     Return in 6 months with carotid duplex exam and to see me unless he develops any neurologic symptoms in the interim Continue daily aspirin Return to normal activities

## 2011-07-20 NOTE — Progress Notes (Signed)
Addended by: Sharee Pimple on: 07/20/2011 10:18 AM   Modules accepted: Orders

## 2011-09-28 DIAGNOSIS — N1831 Chronic kidney disease, stage 3a: Secondary | ICD-10-CM | POA: Insufficient documentation

## 2011-10-21 ENCOUNTER — Emergency Department (INDEPENDENT_AMBULATORY_CARE_PROVIDER_SITE_OTHER): Payer: Medicare Other

## 2011-10-21 ENCOUNTER — Encounter (HOSPITAL_COMMUNITY): Payer: Self-pay

## 2011-10-21 ENCOUNTER — Emergency Department (HOSPITAL_COMMUNITY)
Admission: EM | Admit: 2011-10-21 | Discharge: 2011-10-21 | Disposition: A | Discharge status: Home / Self Care | Payer: Medicare Other | Source: Home / Self Care | Attending: Family Medicine | Admitting: Family Medicine

## 2011-10-21 DIAGNOSIS — B028 Zoster with other complications: Secondary | ICD-10-CM

## 2011-10-21 DIAGNOSIS — B0229 Other postherpetic nervous system involvement: Secondary | ICD-10-CM

## 2011-10-21 DIAGNOSIS — J440 Chronic obstructive pulmonary disease with acute lower respiratory infection: Secondary | ICD-10-CM

## 2011-10-21 MED ORDER — LIDOCAINE 5 % EX OINT: TOPICAL_OINTMENT | CUTANEOUS | Status: AC | PRN

## 2011-10-21 MED ORDER — DOXYCYCLINE HYCLATE 100 MG PO CAPS: 100.0000 mg | ORAL_CAPSULE | Freq: Two times a day (BID) | ORAL | Status: AC

## 2011-10-21 MED ORDER — GUAIFENESIN-DM 100-10 MG/5ML PO LIQD: 5.0000 mL | Freq: Three times a day (TID) | ORAL | Status: DC | PRN

## 2011-10-21 MED ORDER — ACETAMINOPHEN-CODEINE #3 300-30 MG PO TABS
1.0000 | ORAL_TABLET | ORAL | Status: AC | PRN
Start: 2011-10-21 — End: 2011-10-31

## 2011-10-21 MED ORDER — PREDNISONE 20 MG PO TABS: ORAL_TABLET | ORAL | Status: AC

## 2011-10-21 MED ORDER — VALACYCLOVIR HCL 1 G PO TABS: 1000.0000 mg | ORAL_TABLET | Freq: Three times a day (TID) | ORAL | Status: AC

## 2011-10-21 NOTE — ED Provider Notes (Signed)
History     CSN: [id number]  Arrival date & time 10/21/11  4098   First MD Initiated Contact with Patient 10/21/11 1850      Chief Complaint  Patient presents with  . Rash  . Sore Throat  . Otalgia    (Consider location/radiation/quality/duration/timing/severity/associated sxs/prior treatment) HPI Comments: 77 year old male with history of COPD and HTN among other comorbidities here complaining of: #1 a rash in the right lower side of his face for one week. Reports "stinging" sensation radiating upward to worse the right ear also reports right lower dental pain. Reports that she saw bump on his chin about one week ago it was filled with clear fluid which patient "expressed out" and now has a dry scab on top; then 4 days ago the rash starts spread horizontally in right lower face associated with mild burning/stinging sensation. #2 has been having a stacking dry nonproductive cough and sore throat for 3 days. He is a former smoker but quit smoking in 1993. He has a history of COPD and use a home inhaler which he has used 2 or 3 times during the last 3 days. Denies wheezing. Denies shortness of breath or chest pain. No dizziness. No headache, general malaise or decreased appetite. Reports energy level at baseline.   Past Medical History  Diagnosis Date  . Hypertension   . Hyperlipidemia   . Stroke   . Carotid artery occlusion   . COPD (chronic obstructive pulmonary disease)   . GERD (gastroesophageal reflux disease)   . Complication of anesthesia     difficulty awakening from anesthesia, & N & V  . PONV (postoperative nausea and vomiting)   . Shortness of breath     at times due to COPD.  Marland Kitchen Recurrent upper respiratory infection (URI)     last bout of bronchitis 04/2011.    Past Surgical History  Procedure Date  . Appendectomy   . Eye surgery   . Joint replacement     Elbow-shoulder  . Kidney surgery   . Endarterectomy 06/29/2011    Procedure: ENDARTERECTOMY CAROTID;  Surgeon:  Pryor Ochoa, MD;  Location: [facility] OR;  Service: Vascular;  Laterality: Left;  . Elbow surgery   . Shoulder surgery     Family History  Problem Relation Age of Onset  . Heart disease Mother     Heart Disease before age 42  . Hypertension Mother   . Anesthesia problems Neg Hx     History  Substance Use Topics  . Smoking status: Former Smoker    Types: Cigarettes    Quit date: 06/03/1991  . Smokeless tobacco: Never Used  . Alcohol Use: 7.2 oz/week    12 Cans of beer per week      Review of Systems  Constitutional: Negative for fever, chills, diaphoresis, activity change, appetite change and fatigue.  HENT: Positive for ear pain, congestion and sore throat. Negative for trouble swallowing.   Eyes: Negative for pain, discharge, redness and itching.       No visual changes from baseline  Respiratory: Positive for cough. Negative for chest tightness, shortness of breath and wheezing.   Cardiovascular: Negative for chest pain, palpitations and leg swelling.  Gastrointestinal: Negative for nausea, vomiting and abdominal pain.  Skin: Positive for rash.       As per history of present illness  Neurological: Negative for dizziness and headaches.    Allergies  Vicodin and Tape  Home Medications   Current Outpatient Rx  Name Route  Sig Dispense Refill  . AMLODIPINE BESYLATE 5 MG PO TABS Oral Take 5 mg by mouth daily.    . ASPIRIN 325 MG PO TABS Oral Take 325 mg by mouth daily.    Marland Kitchen BROMFENAC SODIUM (ONCE-DAILY) 0.09 % OP SOLN Right Eye Place 1 drop into the right eye daily.    Marland Kitchen DIFLUPREDNATE 0.05 % OP EMUL Right Eye Place 1 drop into the right eye 2 (two) times daily.    Marland Kitchen LOSARTAN POTASSIUM 100 MG PO TABS Oral Take 50 mg by mouth daily.    Marland Kitchen OMEPRAZOLE 20 MG PO CPDR Oral Take 20 mg by mouth daily.    Marland Kitchen PRAVASTATIN SODIUM 40 MG PO TABS Oral Take 40 mg by mouth at bedtime.    Marland Kitchen SILDENAFIL CITRATE 100 MG PO TABS Oral Take 50 mg by mouth daily as needed. ED    .  ACETAMINOPHEN-CODEINE #3 300-30 MG PO TABS Oral Take 1 tablet by mouth every 4 (four) hours as needed for pain. 20 tablet 0  . GUAIFENESIN-DM 100-10 MG/5ML PO LIQD Oral Take 5 mLs by mouth 3 (three) times daily as needed. 120 mL 0  . DOXYCYCLINE HYCLATE 100 MG PO CAPS Oral Take 1 capsule (100 mg total) by mouth 2 (two) times daily. 20 capsule 0  . IBUPROFEN 200 MG PO TABS Oral Take 200 mg by mouth every 6 (six) hours as needed. For pain    . LIDOCAINE 5 % EX OINT Topical Apply topically as needed. 35.44 g 0  . PREDNISONE 20 MG PO TABS  2 tabs po daily for 5 days 10 tablet 0  . VALACYCLOVIR HCL 1 G PO TABS Oral Take 1 tablet (1,000 mg total) by mouth 3 (three) times daily. 21 tablet 0    BP 167/88  Pulse 94  Temp 98.2 F (36.8 C) (Oral)  Resp 18  SpO2 96%  Physical Exam  Nursing note and vitals reviewed. Constitutional: He is oriented to person, place, and time. He appears well-developed and well-nourished. No distress.  HENT:  Head: Normocephalic and atraumatic.  Right Ear: External ear normal.  Left Ear: External ear normal.       Nasal Congestion with erythema, clear rhinorrhea. mild pharyngeal erythema no exudates. No uvula deviation. No trismus. No vesicles, erythema or swelling or exudates of the right ear canal. TM's normal.  Eyes: Conjunctivae and EOM are normal. Pupils are equal, round, and reactive to light. Right eye exhibits no discharge. Left eye exhibits no discharge.  Neck: Normal range of motion. Neck supple.  Cardiovascular: Normal rate, regular rhythm, normal heart sounds and intact distal pulses.  Exam reveals no gallop and no friction rub.   Pulmonary/Chest: Effort normal. No respiratory distress. He has no wheezes. He has no rales. He exhibits no tenderness.       No active wheezing.   Lymphadenopathy:    He has no cervical adenopathy.  Neurological: He is alert and oriented to person, place, and time.  Skin: He is not diaphoretic.       Right lower face:  There is a clear vesicular confluent rash in plaques over erythematous minimally raised base. Following horizontal distribution from right side of chin across lower maxilla. There is hyperesthesia in the affected area as well right ear canal. Rash in the right side of shin has a dark scab on top. No pustules or exudates. (See picture below)     ED Course  Procedures (including critical care time)  Labs Reviewed - No  data to display Dg Chest 2 View  10/21/2011  *RADIOLOGY REPORT*  Clinical Data: Cough, sore throat  CHEST - 2 VIEW  Comparison: 06/21/2011  Findings: Hyperinflation/emphysematous changes. No pleural effusion or pneumothorax.  Patchy retrocardiac opacity on the lateral view, without definite correlate on the frontal radiograph, possibly reflecting a mild lower lobe pneumonia.  The heart is normal in size.  Mild degenerative changes of the visualized thoracolumbar spine.  IMPRESSION: Patchy retrocardiac opacity on the lateral view, possibly reflecting a mild lower lobe pneumonia.  Underlying hyperinflation/emphysematous changes.   Original Report Authenticated By: Charline Bills, M.D.      1. Herpes zoster virus infection of face and ear nerves   2. Bronchitis, chronic obstructive w acute bronchitis       MDM  Rash in face impress herpes zoster no vesicles inside the ear canal or around the eye. Prescribed valacyclovir, prednisone and Xylocaine ointment and Tylenol No. 3. Mild patchy infiltrate on lateral view x-ray clinically well,  with no crackles on lung exam, impress acute on chronic bronchitis/COPD possibly triggered by a viral respiratory infection. Decided to prescribe doxycycline and guaifenesin/dextromethorphan. Patient will continue to use his home inhaler as previously prescribed. Instructed to go to the emergency department if worsening or new symptoms like fever, malaise, chest pain or difficulty breathing otherwise follow up with PCP or return to medical attention if  perssitent or not improvement of symptoms in next 5-7 days despite following treatment.         Sharin Grave, MD 10/21/11 2114

## 2011-10-21 NOTE — ED Notes (Signed)
Pt states he started with "bump" to his chin one week ago.  Reports on Sunday and Monday the rash began spreading up the right side of his face.  States the lesion to his chin burns but the rest of the rash just feels tight.  On Tuesday he began having rt ear pain, rt side dental pain and awakened with a cough and sore throat.  The lesions start as whelps or bumps and then become vesicular. They do not itch.

## 2012-01-23 ENCOUNTER — Encounter: Payer: Self-pay | Admitting: Neurosurgery

## 2012-01-24 ENCOUNTER — Encounter: Payer: Self-pay | Admitting: Neurosurgery

## 2012-01-24 ENCOUNTER — Ambulatory Visit (INDEPENDENT_AMBULATORY_CARE_PROVIDER_SITE_OTHER): Payer: Medicare Other | Admitting: Neurosurgery

## 2012-01-24 ENCOUNTER — Ambulatory Visit (INDEPENDENT_AMBULATORY_CARE_PROVIDER_SITE_OTHER): Payer: Medicare Other | Admitting: Vascular Surgery

## 2012-01-24 VITALS — BP 152/83 | HR 68 | Resp 18 | Ht 72.0 in | Wt 176.0 lb

## 2012-01-24 DIAGNOSIS — I6529 Occlusion and stenosis of unspecified carotid artery: Secondary | ICD-10-CM

## 2012-01-24 DIAGNOSIS — Z48812 Encounter for surgical aftercare following surgery on the circulatory system: Secondary | ICD-10-CM

## 2012-01-24 NOTE — Progress Notes (Signed)
[facility] Carotid Office Note  CC: Carotid surveillance Referring Physician: Hart Rochester  History of Present Illness: 77 year old male patient of Dr. Hart Rochester who is status post left CEA in may of 2013. The patient denies any signs or symptoms of CVA, TIA, amaurosis fugax or any neural deficit. The patient denies any new medical diagnoses or recent surgery.  Past Medical History  Diagnosis Date  . Hypertension   . Hyperlipidemia   . Stroke   . Carotid artery occlusion   . COPD (chronic obstructive pulmonary disease)   . GERD (gastroesophageal reflux disease)   . Complication of anesthesia     difficulty awakening from anesthesia, & N & V  . PONV (postoperative nausea and vomiting)   . Shortness of breath     at times due to COPD.  Marland Kitchen Recurrent upper respiratory infection (URI)     last bout of bronchitis 04/2011.    ROS: [x]  Positive   [ ]  Denies    General: [ ]  Weight loss, [ ]  Fever, [ ]  chills Neurologic: [ ]  Dizziness, [ ]  Blackouts, [ ]  Seizure [ ]  Stroke, [ ]  "Mini stroke", [ ]  Slurred speech, [ ]  Temporary blindness; [ ]  weakness in arms or legs, [ ]  Hoarseness Cardiac: [ ]  Chest pain/pressure, [ ]  Shortness of breath at rest [ ]  Shortness of breath with exertion, [ ]  Atrial fibrillation or irregular heartbeat Vascular: [ ]  Pain in legs with walking, [ ]  Pain in legs at rest, [ ]  Pain in legs at night,  [ ]  Non-healing ulcer, [ ]  Blood clot in vein/DVT,   Pulmonary: [ ]  Home oxygen, [ ]  Productive cough, [ ]  Coughing up blood, [ ]  Asthma,  [ ]  Wheezing Musculoskeletal:  [ ]  Arthritis, [ ]  Low back pain, [ ]  Joint pain Hematologic: [ ]  Easy Bruising, [ ]  Anemia; [ ]  Hepatitis Gastrointestinal: [ ]  Blood in stool, [ ]  Gastroesophageal Reflux/heartburn, [ ]  Trouble swallowing Urinary: [ ]  chronic Kidney disease, [ ]  on HD - [ ]  MWF or [ ]  TTHS, [ ]  Burning with urination, [ ]  Difficulty urinating Skin: [ ]  Rashes, [ ]  Wounds Psychological: [ ]   Anxiety, [ ]  Depression   Social History History  Substance Use Topics  . Smoking status: Former Smoker    Types: Cigarettes    Quit date: 06/03/1991  . Smokeless tobacco: Never Used  . Alcohol Use: 7.2 oz/week    12 Cans of beer per week    Family History Family History  Problem Relation Age of Onset  . Heart disease Mother     Heart Disease before age 86  . Hypertension Mother   . Anesthesia problems Neg Hx     Allergies  Allergen Reactions  . Vicodin (Hydrocodone-Acetaminophen) Other (See Comments)    Hallucinations.  . Tape Itching and Rash    Paper Tape is okay.    Current Outpatient Prescriptions  Medication Sig Dispense Refill  . amLODipine (NORVASC) 5 MG tablet Take 5 mg by mouth daily.      Marland Kitchen aspirin 325 MG tablet Take 325 mg by mouth daily.      Marland Kitchen ibuprofen (ADVIL,MOTRIN) 200 MG tablet Take 200 mg by mouth continuous as needed. For pain      . omeprazole (PRILOSEC) 20 MG capsule Take 20 mg by mouth daily.      . pravastatin (PRAVACHOL) 40 MG tablet Take 40 mg by mouth at bedtime.      Marland Kitchen  Bromfenac Sodium (BROMDAY) 0.09 % SOLN Place 1 drop into the right eye daily.      Marland Kitchen Dextromethorphan-Guaifenesin (GUAIFENESIN-DM) 100-10 MG/5ML LIQD Take 5 mLs by mouth 3 (three) times daily as needed.  120 mL  0  . Difluprednate (DUREZOL) 0.05 % EMUL Place 1 drop into the right eye 2 (two) times daily.      Marland Kitchen lidocaine (XYLOCAINE) 5 % ointment Apply topically as needed.  35.44 g  0  . losartan (COZAAR) 100 MG tablet Take 50 mg by mouth daily.      . sildenafil (VIAGRA) 100 MG tablet Take 50 mg by mouth daily as needed. ED        Physical Examination  Filed Vitals:   01/24/12 1047  BP: 152/83  Pulse: 68  Resp: 18    Body mass index is 23.87 kg/(m^2).  General:  WDWN in NAD Gait: Normal HEENT: WNL Eyes: Pupils equal Pulmonary: normal non-labored breathing , without Rales, rhonchi,  wheezing Cardiac: RRR, without  Murmurs, rubs or gallops; Abdomen: soft, NT, no  masses Skin: no rashes, ulcers noted  Vascular Exam Pulses: 3+ radial pulses bilaterally Carotid bruits: Carotid pulses to auscultation no bruits are heard Extremities without ischemic changes, no Gangrene , no cellulitis; no open wounds;  Musculoskeletal: no muscle wasting or atrophy   Neurologic: A&O X 3; Appropriate Affect ; SENSATION: normal; MOTOR FUNCTION:  moving all extremities equally. Speech is fluent/normal  Non-Invasive Vascular Imaging CAROTID DUPLEX 01/24/2012  Right ICA 20 - 39 % stenosis Left ICA 0 - 19% stenosis   ASSESSMENT/PLAN: Asymptomatic patient 6 months status post left CEA and doing well. The patient will followup in 6 months with repeat carotid duplex,if  he is doing well at that time we will move to one year. The patient's questions were encouraged and answered, he is in agreement with this plan.  Lauree Chandler ANP   Clinic MD: Hart Rochester

## 2012-01-24 NOTE — Progress Notes (Signed)
Carotid duplex performed @ [facility] 01/24/2012

## 2012-01-25 NOTE — Addendum Note (Signed)
Addended by: Sharee Pimple on: 01/25/2012 02:13 PM   Modules accepted: Orders

## 2012-07-24 ENCOUNTER — Other Ambulatory Visit (INDEPENDENT_AMBULATORY_CARE_PROVIDER_SITE_OTHER): Payer: Medicare Other | Admitting: *Deleted

## 2012-07-24 ENCOUNTER — Ambulatory Visit (INDEPENDENT_AMBULATORY_CARE_PROVIDER_SITE_OTHER): Payer: Medicare Other | Admitting: Vascular Surgery

## 2012-07-24 ENCOUNTER — Encounter: Payer: Self-pay | Admitting: Vascular Surgery

## 2012-07-24 ENCOUNTER — Ambulatory Visit: Payer: Medicare Other | Admitting: Neurosurgery

## 2012-07-24 DIAGNOSIS — I6529 Occlusion and stenosis of unspecified carotid artery: Secondary | ICD-10-CM

## 2012-07-24 DIAGNOSIS — Z48812 Encounter for surgical aftercare following surgery on the circulatory system: Secondary | ICD-10-CM

## 2012-07-24 NOTE — Progress Notes (Signed)
Subjective:     Patient ID: Robert Bowers, male   DOB: 08/12/1934, 78 y.o.   MRN: [id number]  HPI this 78 year old male returns one year post left carotid endarterectomy for severe asymptomatic left ICA stenosis. He denies any neurologic symptoms such as lateralizing weakness, amaurosis fugax, aphasia, diplopia, blurred incision, and syncope. He also denies chest pain, dyspnea on exertion unless he is climbing up hills. He does have COPD. He takes one aspirin per day.  Past Medical History  Diagnosis Date  . Hypertension   . Hyperlipidemia   . Stroke   . Carotid artery occlusion   . COPD (chronic obstructive pulmonary disease)   . GERD (gastroesophageal reflux disease)   . Complication of anesthesia     difficulty awakening from anesthesia, & N & V  . PONV (postoperative nausea and vomiting)   . Shortness of breath     at times due to COPD.  Marland Kitchen Recurrent upper respiratory infection (URI)     last bout of bronchitis 04/2011.    History  Substance Use Topics  . Smoking status: Former Smoker    Types: Cigarettes    Quit date: 06/03/1991  . Smokeless tobacco: Never Used  . Alcohol Use: 7.2 oz/week    12 Cans of beer per week    Family History  Problem Relation Age of Onset  . Heart disease Mother     Heart Disease before age 28  . Hypertension Mother   . Anesthesia problems Neg Hx     Allergies  Allergen Reactions  . Vicodin (Hydrocodone-Acetaminophen) Other (See Comments)    Hallucinations.  . Tape Itching and Rash    Paper Tape is okay.    Current outpatient prescriptions:amLODipine (NORVASC) 5 MG tablet, Take 5 mg by mouth daily., Disp: , Rfl: ;  aspirin 325 MG tablet, Take 325 mg by mouth daily., Disp: , Rfl: ;  ibuprofen (ADVIL,MOTRIN) 200 MG tablet, Take 200 mg by mouth continuous as needed. For pain, Disp: , Rfl: ;  omeprazole (PRILOSEC) 20 MG capsule, Take 20 mg by mouth daily., Disp: , Rfl: ;  pravastatin (PRAVACHOL) 40 MG tablet, Take 40 mg by mouth at bedtime.,  Disp: , Rfl:  Bromfenac Sodium (BROMDAY) 0.09 % SOLN, Place 1 drop into the right eye daily., Disp: , Rfl: ;  Dextromethorphan-Guaifenesin (GUAIFENESIN-DM) 100-10 MG/5ML LIQD, Take 5 mLs by mouth 3 (three) times daily as needed., Disp: 120 mL, Rfl: 0;  Difluprednate (DUREZOL) 0.05 % EMUL, Place 1 drop into the right eye 2 (two) times daily., Disp: , Rfl: ;  lidocaine (XYLOCAINE) 5 % ointment, Apply topically as needed., Disp: 35.44 g, Rfl: 0 losartan (COZAAR) 100 MG tablet, Take 50 mg by mouth daily., Disp: , Rfl: ;  sildenafil (VIAGRA) 100 MG tablet, Take 50 mg by mouth daily as needed. ED, Disp: , Rfl:   BP 169/86  Pulse 64  Resp 16  Ht 6' (1.829 m)  Wt 180 lb (81.647 kg)  BMI 24.41 kg/m2  Body mass index is 24.41 kg/(m^2).          Review of Systems denies chest pain, claudication, PND, orthopnea, wheezing, all systems negative and complete review of systems except for dyspnea on significant exertion     Objective:   Physical Exam blood pressure 169/86 heart rate 64 respirations 16 Gen.-alert and oriented x3 in no apparent distress HEENT normal for age Lungs no rhonchi or wheezing Cardiovascular regular rhythm no murmurs carotid pulses 3+ palpable no bruits audible Abdomen soft  nontender no palpable masses Musculoskeletal free of  major deformities Skin clear -no rashes Neurologic normal Lower extremities 3+ femoral and dorsalis pedis pulses palpable bilaterally with no edema  Today I ordered a carotid duplex exam which I reviewed and interpreted. There is no significant ICA flow reduction bilaterally. No evidence of restenosis on the left.      Assessment:     One year post left carotid endarterectomy for severe asymptomatic stenosis-no evidence of restenosis     Plan:     Return in one year for followup carotid duplex exam Continue daily aspirin

## 2012-07-31 ENCOUNTER — Other Ambulatory Visit: Payer: Self-pay | Admitting: *Deleted

## 2013-03-31 HISTORY — PX: EYE SURGERY: SHX253

## 2013-04-16 ENCOUNTER — Other Ambulatory Visit: Payer: Self-pay | Admitting: Vascular Surgery

## 2013-04-16 DIAGNOSIS — I6529 Occlusion and stenosis of unspecified carotid artery: Secondary | ICD-10-CM

## 2013-04-16 DIAGNOSIS — Z48812 Encounter for surgical aftercare following surgery on the circulatory system: Secondary | ICD-10-CM

## 2013-07-19 ENCOUNTER — Encounter: Payer: Self-pay | Admitting: Family

## 2013-07-23 ENCOUNTER — Ambulatory Visit (HOSPITAL_COMMUNITY)
Admission: RE | Admit: 2013-07-23 | Discharge: 2013-07-23 | Disposition: A | Discharge status: Home / Self Care | Payer: Medicare Other | Source: Ambulatory Visit | Attending: Vascular Surgery | Admitting: Vascular Surgery

## 2013-07-23 ENCOUNTER — Ambulatory Visit (INDEPENDENT_AMBULATORY_CARE_PROVIDER_SITE_OTHER): Payer: Medicare Other | Admitting: Family

## 2013-07-23 ENCOUNTER — Encounter: Payer: Self-pay | Admitting: Family

## 2013-07-23 VITALS — BP 156/86 | HR 67 | Resp 16 | Ht 72.0 in | Wt 170.0 lb

## 2013-07-23 DIAGNOSIS — I6529 Occlusion and stenosis of unspecified carotid artery: Secondary | ICD-10-CM

## 2013-07-23 DIAGNOSIS — Z48812 Encounter for surgical aftercare following surgery on the circulatory system: Secondary | ICD-10-CM | POA: Insufficient documentation

## 2013-07-23 NOTE — Progress Notes (Signed)
Established Carotid Patient   History of Present Illness  Robert Bowers is a 79 y.o. male patient of Dr. Kellie Simmering who is s/p left CEA in 2013, returns today for follow up.  He had a TIA sometime before the 2013 CEA, no TIA or stroke activity since then; this manifested as transient unilateral leg weakness, he does not remember which leg, denies any other neurological symptoms; he no longer has leg weakness.   denies New Medical or Surgical History: eyelid surgery, brow lift.  Pt Diabetic: No Pt smoker: former smoker, quit 25 years ago  Pt meds include: Statin : Yes ASA: Yes Other anticoagulants/antiplatelets: no   Past Medical History  Diagnosis Date  . Hypertension   . Hyperlipidemia   . Stroke   . Carotid artery occlusion   . COPD (chronic obstructive pulmonary disease)   . GERD (gastroesophageal reflux disease)   . Complication of anesthesia     difficulty awakening from anesthesia, & N & V  . PONV (postoperative nausea and vomiting)   . Shortness of breath     at times due to COPD.  Marland Kitchen Recurrent upper respiratory infection (URI)     last bout of bronchitis 04/2011.    Social History History  Substance Use Topics  . Smoking status: Former Smoker    Types: Cigarettes    Quit date: 06/03/1991  . Smokeless tobacco: Never Used  . Alcohol Use: 7.2 oz/week    12 Cans of beer per week    Family History Family History  Problem Relation Age of Onset  . Heart disease Mother     Heart Disease before age 42  . Hypertension Mother   . Hyperlipidemia Mother   . Heart attack Mother   . Anesthesia problems Neg Hx   . Kidney disease Father     Surgical History Past Surgical History  Procedure Laterality Date  . Appendectomy    . Joint replacement      Elbow-shoulder  . Kidney surgery    . Endarterectomy  06/29/2011    Procedure: ENDARTERECTOMY CAROTID;  Surgeon: Mal Misty, MD;  Location: [facility];  Service: Vascular;  Laterality: Left;  . Elbow surgery    .  Shoulder surgery    . Eye surgery  Aug. and Dec. 2012    Detatched retina and Macular Deg.  . Eye surgery Bilateral Feb. 2015    Eyelid / Eyebrow    Allergies  Allergen Reactions  . Vicodin [Hydrocodone-Acetaminophen] Other (See Comments)    Hallucinations.  . Tape Itching and Rash    Paper Tape is okay.    Current Outpatient Prescriptions  Medication Sig Dispense Refill  . amLODipine (NORVASC) 5 MG tablet Take 5 mg by mouth daily.      Marland Kitchen aspirin 325 MG tablet Take 325 mg by mouth daily.      Marland Kitchen ibuprofen (ADVIL,MOTRIN) 200 MG tablet Take 200 mg by mouth as needed. For pain      . losartan (COZAAR) 100 MG tablet Take 50 mg by mouth daily.      Marland Kitchen omeprazole (PRILOSEC) 20 MG capsule Take 20 mg by mouth daily.      . pravastatin (PRAVACHOL) 40 MG tablet Take 40 mg by mouth at bedtime.      . Bromfenac Sodium (BROMDAY) 0.09 % SOLN Place 1 drop into the right eye daily.      Marland Kitchen Dextromethorphan-Guaifenesin (GUAIFENESIN-DM) 100-10 MG/5ML LIQD Take 5 mLs by mouth 3 (three) times daily as needed.  120 mL  0  . Difluprednate (DUREZOL) 0.05 % EMUL Place 1 drop into the right eye 2 (two) times daily.      . sildenafil (VIAGRA) 100 MG tablet Take 50 mg by mouth daily as needed. ED       No current facility-administered medications for this visit.    Review of Systems : See HPI for pertinent positives and negatives.  Physical Examination   Filed Vitals:   07/23/13 1507 07/23/13 1510  BP: 153/88 156/86  Pulse: 73 67  Resp:  16  Height:  6' (1.829 m)  Weight:  170 lb (77.111 kg)  SpO2:  97%   Body mass index is 23.05 kg/(m^2).   General: WDWN male in NAD GAIT: normal Eyes: PERRLA Pulmonary:  Non-labored, CTAB, Negative  Rales, Negative rhonchi, & Negative wheezing.  Cardiac: regular Rhythm ,  Negative detected murmur.  VASCULAR EXAM Carotid Bruits Left Right   Negative Negative   Radial pulses are 2+ palpable and equal.                                                                                                                             LE Pulses LEFT RIGHT       POPLITEAL  not palpable   not palpable       POSTERIOR TIBIAL  not palpable   not palpable        DORSALIS PEDIS      ANTERIOR TIBIAL  palpable  palpable     Gastrointestinal: soft, nontender, BS WNL, no r/g,  negative masses.  Musculoskeletal: Negative muscle atrophy/wasting. M/S 5/5 throughout, Extremities without ischemic changes.  Neurologic: A&O X 3; Appropriate Affect ; SENSATION ;normal;  Speech is normal CN 2-12 intact, Pain and light touch intact in extremities, Motor exam as listed above.   Non-Invasive Vascular Imaging CAROTID DUPLEX 07/23/2013   CEREBROVASCULAR DUPLEX EVALUATION    INDICATION: Carotid artery disease    PREVIOUS INTERVENTION(S): Left carotid endarterectomy 06/29/2011    DUPLEX EXAM: Carotid duplex    RIGHT  LEFT  Peak Systolic Velocities (cm/s) End Diastolic Velocities (cm/s) Plaque LOCATION Peak Systolic Velocities (cm/s) End Diastolic Velocities (cm/s) Plaque  97 15 - CCA PROXIMAL 104 14 -  92 17 - CCA MID 93 18 -  74 15 HT CCA DISTAL 80 20 -  128 23 HT ECA 144 27 -  86 22 HT ICA PROXIMAL 48 11 -  65 17 - ICA MID 67 17 -  72 18 - ICA DISTAL 74 16 -    .93 ICA / CCA Ratio (PSV) N/A  Antegrade Vertebral Flow Antegrade  622 Brachial Systolic Pressure (mmHg) 297  Triphasic Brachial Artery Waveforms Triphasic    Plaque Morphology:  HM = Homogeneous, HT = Heterogeneous, CP = Calcific Plaque, SP = Smooth Plaque, IP = Irregular Plaque     ADDITIONAL FINDINGS:     IMPRESSION: 1. Less than 40% right internal carotid artery stenosis. 2.  Patent left carotid endarterectomy site with no evidence for restenosis.    Compared to the previous exam:  No change.      Assessment: Robert Bowers is a 79 y.o. male who is s/p left CEA in 2013.  He had a TIA sometime before the 2013 CEA, no TIA or stroke activity since then; this manifested as transient  unilateral leg weakness, he does not remember which leg, denies any other neurological symptoms; he no longer has leg weakness. He does not have DM and fortunately stopped smoking about 25 years ago. Today's carotid Duplex indicates less than 40% right internal carotid artery stenosis and patent left carotid endarterectomy site with no evidence for restenosis. The  ICA stenosis is  Unchanged from previous exam.  Plan: Follow-up in 1 years with Carotid Duplex scan.   I discussed in depth with the patient the nature of atherosclerosis, and emphasized the importance of maximal medical management including strict control of blood pressure, blood glucose, and lipid levels, obtaining regular exercise, and continued cessation of smoking.  The patient is aware that without maximal medical management the underlying atherosclerotic disease process will progress, limiting the benefit of any interventions. The patient was given information about stroke prevention and what symptoms should prompt the patient to seek immediate medical care. Thank you for allowing [place] to participate in this patient's care.  Clemon Chambers, RN, MSN, FNP-C [facility] Office: [phone]  Clinic Physician: Kellie Simmering  07/23/2013 3:20 PM

## 2013-07-23 NOTE — Patient Instructions (Signed)

## 2013-07-28 ENCOUNTER — Ambulatory Visit (INDEPENDENT_AMBULATORY_CARE_PROVIDER_SITE_OTHER): Payer: Medicare Other | Admitting: Emergency Medicine

## 2013-07-28 ENCOUNTER — Ambulatory Visit: Payer: Medicare Other

## 2013-07-28 VITALS — BP 126/64 | HR 86 | Temp 97.8°F | Resp 16 | Ht 69.5 in | Wt 162.8 lb

## 2013-07-28 DIAGNOSIS — R05 Cough: Secondary | ICD-10-CM

## 2013-07-28 DIAGNOSIS — R059 Cough, unspecified: Secondary | ICD-10-CM

## 2013-07-28 DIAGNOSIS — B370 Candidal stomatitis: Secondary | ICD-10-CM

## 2013-07-28 DIAGNOSIS — J209 Acute bronchitis, unspecified: Secondary | ICD-10-CM

## 2013-07-28 MED ORDER — NYSTATIN 100000 UNIT/ML MT SUSP: 5.0000 mL | Freq: Four times a day (QID) | OROMUCOSAL | Status: DC

## 2013-07-28 MED ORDER — AZITHROMYCIN 250 MG PO TABS: ORAL_TABLET | ORAL | Status: DC

## 2013-07-28 MED ORDER — HYDROCOD POLST-CHLORPHEN POLST 10-8 MG/5ML PO LQCR: 5.0000 mL | Freq: Two times a day (BID) | ORAL | Status: DC | PRN

## 2013-07-28 NOTE — Progress Notes (Signed)
[facility] [address], [place] [address] [phone]  Date:  07/28/2013   Name:  Robert Bowers   DOB:  1934/07/16   MRN:  [id number]  PCP:  Marton Redwood, MD    Chief Complaint: Cough   History of Present Illness:  Robert Bowers is a 79 y.o. very pleasant male patient who presents with the following:  Ill for two weeks.  Has a cough and nasal congestion and drainage.  Mucopurulent.  Just finished a course of prednisone, cefdinir, and tussionex. Now has thrush in his mouth and tongue.  Has clear watery drainage from nose. No fever or chills. No wheezing or shortness of breath.  No nausea or vomiting.  No stool change.  No improvement with over the counter medications or other home remedies. Denies other complaint or health concern today.   Patient Active Problem List   Diagnosis Date Noted  . Aftercare following surgery of the circulatory system, [place] 07/23/2013  . Carotid artery stenosis, asymptomatic 06/07/2011    Past Medical History  Diagnosis Date  . Hypertension   . Hyperlipidemia   . Stroke   . Carotid artery occlusion   . COPD (chronic obstructive pulmonary disease)   . GERD (gastroesophageal reflux disease)   . Complication of anesthesia     difficulty awakening from anesthesia, & N & V  . PONV (postoperative nausea and vomiting)   . Shortness of breath     at times due to COPD.  Marland Kitchen Recurrent upper respiratory infection (URI)     last bout of bronchitis 04/2011.    Past Surgical History  Procedure Laterality Date  . Appendectomy    . Joint replacement      Elbow-shoulder  . Kidney surgery    . Endarterectomy  06/29/2011    Procedure: ENDARTERECTOMY CAROTID;  Surgeon: Mal Misty, MD;  Location: [facility];  Service: Vascular;  Laterality: Left;  . Elbow surgery    . Shoulder surgery    . Eye surgery  Aug. and Dec. 2012    Detatched retina and Macular Deg.  . Eye surgery Bilateral Feb. 2015    Eyelid / Eyebrow    History   Substance Use Topics  . Smoking status: Former Smoker -- 1.00 packs/day for 35 years    Types: Cigarettes    Quit date: 06/03/1991  . Smokeless tobacco: Never Used  . Alcohol Use: 7.2 oz/week    12 Cans of beer per week    Family History  Problem Relation Age of Onset  . Heart disease Mother     Heart Disease before age 30  . Hypertension Mother   . Hyperlipidemia Mother   . Heart attack Mother   . Anesthesia problems Neg Hx   . Kidney disease Father     Allergies  Allergen Reactions  . Vicodin [Hydrocodone-Acetaminophen] Other (See Comments)    Hallucinations.  . Tape Itching and Rash    Paper Tape is okay.    Medication list has been reviewed and updated.  Current Outpatient Prescriptions on File Prior to Visit  Medication Sig Dispense Refill  . amLODipine (NORVASC) 5 MG tablet Take 5 mg by mouth daily.      Marland Kitchen aspirin 325 MG tablet Take 325 mg by mouth daily.      Marland Kitchen ibuprofen (ADVIL,MOTRIN) 200 MG tablet Take 200 mg by mouth as needed. For pain      . losartan (COZAAR) 100 MG tablet Take 50 mg  by mouth daily.      Marland Kitchen omeprazole (PRILOSEC) 20 MG capsule Take 20 mg by mouth daily.      . pravastatin (PRAVACHOL) 40 MG tablet Take 40 mg by mouth at bedtime.       No current facility-administered medications on file prior to visit.    Review of Systems:  As per HPI, otherwise negative.    Physical Examination: Filed Vitals:   07/28/13 1136  BP: 126/64  Pulse: 86  Temp: 97.8 F (36.6 C)  Resp: 16   Filed Vitals:   07/28/13 1136  Height: 5' 9.5" (1.765 m)  Weight: 162 lb 12.8 oz (73.846 kg)   Body mass index is 23.7 kg/(m^2). Ideal Body Weight: Weight in (lb) to have BMI = 25: 171.4  GEN: WDWN, NAD, Non-toxic, A & O x 3 HEENT: Atraumatic, Normocephalic. Neck supple. No masses, No LAD.  thrush Ears and Nose: No external deformity. CV: RRR, No M/G/R. No JVD. No thrill. No extra heart sounds. PULM: CTA B, no wheezes, crackles, rhonchi. No retractions. No  resp. distress. No accessory muscle use. ABD: S, NT, ND, +BS. No rebound. No HSM. EXTR: No c/c/e NEURO Normal gait.  PSYCH: Normally interactive. Conversant. Not depressed or anxious appearing.  Calm demeanor.    Assessment and Plan: Bronchitis tussionex zpak Thrush Nystatin  Signed,  Ellison Carwin, MD   [facility] reading (PRIMARY) by  Dr. Ouida Sills.  Negative chest.

## 2013-07-28 NOTE — Patient Instructions (Signed)
Thrush, Adult  Thrush, also called oral candidiasis, is a fungal infection that develops in the mouth and throat and on the tongue. It causes white patches to form on the mouth and tongue. Thrush is most common in older adults, but it can occur at any age.  Many cases of thrush are mild, but this infection can also be more serious. Thrush can be a recurring problem for people who have chronic illnesses or who take medicines that limit the body's ability to fight infection. Because these people have difficulty fighting infections, the fungus that causes thrush can spread throughout the body. This can cause life-threatening blood or organ infections. CAUSES  Thrush is usually caused by a yeast called Candida albicans. This fungus is normally present in small amounts in the mouth and on other mucous membranes. It usually causes no harm. However, when conditions are present that allow the fungus to grow uncontrolled, it invades surrounding tissues and becomes an infection. Less often, other Candida species can also lead to thrush.  RISK FACTORS Thrush is more likely to develop in the following people:  People with an impaired ability to fight infection (weakened immune system).   Older adults.   People with HIV.   People with diabetes.   People with dry mouth (xerostomia).   Pregnant women.   People with poor dental care, especially those who have false teeth.   People who use antibiotic medicines.  SIGNS AND SYMPTOMS  Thrush can be a mild infection that causes no symptoms. If symptoms develop, they may include:   A burning feeling in the mouth and throat. This can occur at the start of a thrush infection.   White patches that adhere to the mouth and tongue. The tissue around the patches may be red, raw, and painful. If rubbed (during tooth brushing, for example), the patches and the tissue of the mouth may bleed easily.   A bad taste in the mouth or difficulty tasting foods.    Cottony feeling in the mouth.   Pain during eating and swallowing. DIAGNOSIS  Your health care provider can usually diagnose thrush by looking in your mouth and asking you questions about your health.  TREATMENT  Medicines that help prevent the growth of fungi (antifungals) are the standard treatment for thrush. These medicines are either applied directly to the affected area (topical) or swallowed (oral). The treatment will depend on the severity of the condition.  Mild Thrush Mild cases of thrush may clear up with the use of an antifungal mouth rinse or lozenges. Treatment usually lasts about 14 days.  Moderate to Severe Thrush  More severe thrush infections that have spread to the esophagus are treated with an oral antifungal medicine. A topical antifungal medicine may also be used.   For some severe infections, a treatment period longer than 14 days may be needed.   Oral antifungal medicines are almost never used during pregnancy because the fetus may be harmed. However, if a pregnant woman has a rare, severe thrush infection that has spread to her blood, oral antifungal medicines may be used. In this case, the risk of harm to the mother and fetus from the severe thrush infection may be greater than the risk posed by the use of antifungal medicines.  Persistent or Recurrent Thrush For cases of thrush that do not go away or keep coming back, treatment may involve the following:   Treatment may be needed twice as long as the symptoms last.   Treatment will   include both oral and topical antifungal medicines.   People with weakened immune systems can take an antifungal medicine on a continuous basis to prevent thrush infections.  It is important to treat conditions that make you more likely to get thrush, such as diabetes or HIV.  HOME CARE INSTRUCTIONS   Only take over-the-counter or prescription medicine as directed by your health care provider. Talk to your health care  provider about an over-the-counter medicine called gentian violet, which kills bacteria and fungi.   Eat plain, unflavored yogurt as directed by your health care provider. Check the label to make sure the yogurt contains live cultures. This yogurt can help healthy bacteria grow in the mouth that can stop the growth of the fungus that causes thrush.   Try these measures to help reduce the discomfort of thrush:   Drink cold liquids such as water or iced tea.   Try flavored ice treats or frozen juices.   Eat foods that are easy to swallow, such as gelatin, ice cream, or custard.   If the patches in your mouth are painful, try drinking from a straw.   Rinse your mouth several times a day with a warm saltwater rinse. You can make the saltwater mixture with 1 tsp (6 g) of salt in 8 fl oz (0.2 L) of warm water.   If you wear dentures, remove the dentures before going to bed, brush them vigorously, and soak them in a cleaning solution as directed by your health care provider.   Women who are breastfeeding should clean their nipples with an antifungal medicine as directed by their health care provider. Dry the nipples after breastfeeding. Applying lanolin-containing body lotion may help relieve nipple soreness.  SEEK MEDICAL CARE IF:  Your symptoms are getting worse or are not improving within 7 days of starting treatment.   You have symptoms of spreading infection, such as white patches on the skin outside of the mouth.   You are nursing and you have redness, burning, or pain in the nipples that is not relieved with treatment.  MAKE SURE YOU:  Understand these instructions.  Will watch your condition.  Will get help right away if you are not doing well or get worse. Document Released: 11/10/2003 Document Revised: 12/05/2012 Document Reviewed: 09/17/2012 [facility] Patient Information 2014 [place]. Bronchitis Bronchitis is inflammation of the airways that extend from the  windpipe into the lungs (bronchi). The inflammation often causes mucus to develop, which leads to a cough. If the inflammation becomes severe, it may cause shortness of breath. CAUSES  Bronchitis may be caused by:   Viral infections.   Bacteria.   Cigarette smoke.   Allergens, pollutants, and other irritants.  SIGNS AND SYMPTOMS  The most common symptom of bronchitis is a frequent cough that produces mucus. Other symptoms include:  Fever.   Body aches.   Chest congestion.   Chills.   Shortness of breath.   Sore throat.  DIAGNOSIS  Bronchitis is usually diagnosed through a medical history and physical exam. Tests, such as chest X-rays, are sometimes done to rule out other conditions.  TREATMENT  You may need to avoid contact with whatever caused the problem (smoking, for example). Medicines are sometimes needed. These may include:  Antibiotics. These may be prescribed if the condition is caused by bacteria.  Cough suppressants. These may be prescribed for relief of cough symptoms.   Inhaled medicines. These may be prescribed to help open your airways and make it easier for  you to breathe.   Steroid medicines. These may be prescribed for those with recurrent (chronic) bronchitis. HOME CARE INSTRUCTIONS  Get plenty of rest.   Drink enough fluids to keep your urine clear or pale yellow (unless you have a medical condition that requires fluid restriction). Increasing fluids may help thin your secretions and will prevent dehydration.   Only take over-the-counter or prescription medicines as directed by your health care provider.  Only take antibiotics as directed. Make sure you finish them even if you start to feel better.  Avoid secondhand smoke, irritating chemicals, and strong fumes. These will make bronchitis worse. If you are a smoker, quit smoking. Consider using nicotine gum or skin patches to help control withdrawal symptoms. Quitting smoking will help  your lungs heal faster.   Put a cool-mist humidifier in your bedroom at night to moisten the air. This may help loosen mucus. Change the water in the humidifier daily. You can also run the hot water in your shower and sit in the bathroom with the door closed for 5 10 minutes.   Follow up with your health care provider as directed.   Wash your hands frequently to avoid catching bronchitis again or spreading an infection to others.  SEEK MEDICAL CARE IF: Your symptoms do not improve after 1 week of treatment.  SEEK IMMEDIATE MEDICAL CARE IF:  Your fever increases.  You have chills.   You have chest pain.   You have worsening shortness of breath.   You have bloody sputum.  You faint.  You have lightheadedness.  You have a severe headache.   You vomit repeatedly. MAKE SURE YOU:   Understand these instructions.  Will watch your condition.  Will get help right away if you are not doing well or get worse. Document Released: 02/14/2005 Document Revised: 12/05/2012 Document Reviewed: 10/09/2012 [facility] Patient Information 2014 [place].

## 2014-04-24 DIAGNOSIS — Z0000 Encounter for general adult medical examination without abnormal findings: Secondary | ICD-10-CM | POA: Diagnosis not present

## 2014-04-24 DIAGNOSIS — Z125 Encounter for screening for malignant neoplasm of prostate: Secondary | ICD-10-CM | POA: Diagnosis not present

## 2014-04-24 DIAGNOSIS — E785 Hyperlipidemia, unspecified: Secondary | ICD-10-CM | POA: Diagnosis not present

## 2014-04-24 DIAGNOSIS — I10 Essential (primary) hypertension: Secondary | ICD-10-CM | POA: Diagnosis not present

## 2014-05-01 DIAGNOSIS — K219 Gastro-esophageal reflux disease without esophagitis: Secondary | ICD-10-CM | POA: Diagnosis not present

## 2014-05-01 DIAGNOSIS — I6529 Occlusion and stenosis of unspecified carotid artery: Secondary | ICD-10-CM | POA: Diagnosis not present

## 2014-05-01 DIAGNOSIS — I69959 Hemiplegia and hemiparesis following unspecified cerebrovascular disease affecting unspecified side: Secondary | ICD-10-CM | POA: Diagnosis not present

## 2014-05-01 DIAGNOSIS — Z1389 Encounter for screening for other disorder: Secondary | ICD-10-CM | POA: Diagnosis not present

## 2014-05-01 DIAGNOSIS — J449 Chronic obstructive pulmonary disease, unspecified: Secondary | ICD-10-CM | POA: Diagnosis not present

## 2014-05-01 DIAGNOSIS — I10 Essential (primary) hypertension: Secondary | ICD-10-CM | POA: Diagnosis not present

## 2014-05-01 DIAGNOSIS — N183 Chronic kidney disease, stage 3 (moderate): Secondary | ICD-10-CM | POA: Diagnosis not present

## 2014-05-01 DIAGNOSIS — E785 Hyperlipidemia, unspecified: Secondary | ICD-10-CM | POA: Diagnosis not present

## 2014-05-29 DIAGNOSIS — Z1212 Encounter for screening for malignant neoplasm of rectum: Secondary | ICD-10-CM | POA: Diagnosis not present

## 2014-07-29 ENCOUNTER — Ambulatory Visit: Payer: Medicare Other | Admitting: Family

## 2014-07-29 ENCOUNTER — Other Ambulatory Visit (HOSPITAL_COMMUNITY): Payer: Medicare Other

## 2014-08-01 ENCOUNTER — Encounter: Payer: Self-pay | Admitting: Family

## 2014-08-04 ENCOUNTER — Encounter: Payer: Self-pay | Admitting: Family

## 2014-08-04 ENCOUNTER — Ambulatory Visit (HOSPITAL_COMMUNITY)
Admission: RE | Admit: 2014-08-04 | Discharge: 2014-08-04 | Disposition: A | Discharge status: Home / Self Care | Payer: Commercial Managed Care - HMO | Source: Ambulatory Visit | Attending: Family | Admitting: Family

## 2014-08-04 ENCOUNTER — Ambulatory Visit (INDEPENDENT_AMBULATORY_CARE_PROVIDER_SITE_OTHER): Payer: Commercial Managed Care - HMO | Admitting: Family

## 2014-08-04 VITALS — BP 144/74 | HR 63 | Resp 16 | Ht 71.5 in | Wt 169.0 lb

## 2014-08-04 DIAGNOSIS — Z87891 Personal history of nicotine dependence: Secondary | ICD-10-CM

## 2014-08-04 DIAGNOSIS — Z9889 Other specified postprocedural states: Secondary | ICD-10-CM | POA: Diagnosis not present

## 2014-08-04 DIAGNOSIS — Z48812 Encounter for surgical aftercare following surgery on the circulatory system: Secondary | ICD-10-CM

## 2014-08-04 DIAGNOSIS — I6523 Occlusion and stenosis of bilateral carotid arteries: Secondary | ICD-10-CM | POA: Diagnosis not present

## 2014-08-04 NOTE — Patient Instructions (Signed)
Stroke Prevention Some medical conditions and behaviors are associated with an increased chance of having a stroke. You may prevent a stroke by making healthy choices and managing medical conditions. HOW CAN I REDUCE MY RISK OF HAVING A STROKE?   Stay physically active. Get at least 30 minutes of activity on most or all days.  Do not smoke. It may also be helpful to avoid exposure to secondhand smoke.  Limit alcohol use. Moderate alcohol use is considered to be:  No more than 2 drinks per day for men.  No more than 1 drink per day for nonpregnant women.  Eat healthy foods. This involves:  Eating 5 or more servings of fruits and vegetables a day.  Making dietary changes that address high blood pressure (hypertension), high cholesterol, diabetes, or obesity.  Manage your cholesterol levels.  Making food choices that are high in fiber and low in saturated fat, trans fat, and cholesterol may control cholesterol levels.  Take any prescribed medicines to control cholesterol as directed by your health care provider.  Manage your diabetes.  Controlling your carbohydrate and sugar intake is recommended to manage diabetes.  Take any prescribed medicines to control diabetes as directed by your health care provider.  Control your hypertension.  Making food choices that are low in salt (sodium), saturated fat, trans fat, and cholesterol is recommended to manage hypertension.  Take any prescribed medicines to control hypertension as directed by your health care provider.  Maintain a healthy weight.  Reducing calorie intake and making food choices that are low in sodium, saturated fat, trans fat, and cholesterol are recommended to manage weight.  Stop drug abuse.  Avoid taking birth control pills.  Talk to your health care provider about the risks of taking birth control pills if you are over 35 years old, smoke, get migraines, or have ever had a blood clot.  Get evaluated for sleep  disorders (sleep apnea).  Talk to your health care provider about getting a sleep evaluation if you snore a lot or have excessive sleepiness.  Take medicines only as directed by your health care provider.  For some people, aspirin or blood thinners (anticoagulants) are helpful in reducing the risk of forming abnormal blood clots that can lead to stroke. If you have the irregular heart rhythm of atrial fibrillation, you should be on a blood thinner unless there is a good reason you cannot take them.  Understand all your medicine instructions.  Make sure that other conditions (such as anemia or atherosclerosis) are addressed. SEEK IMMEDIATE MEDICAL CARE IF:   You have sudden weakness or numbness of the face, arm, or leg, especially on one side of the body.  Your face or eyelid droops to one side.  You have sudden confusion.  You have trouble speaking (aphasia) or understanding.  You have sudden trouble seeing in one or both eyes.  You have sudden trouble walking.  You have dizziness.  You have a loss of balance or coordination.  You have a sudden, severe headache with no known cause.  You have new chest pain or an irregular heartbeat. Any of these symptoms may represent a serious problem that is an emergency. Do not wait to see if the symptoms will go away. Get medical help at once. Call your local emergency services (911 in [place] Do not drive yourself to the hospital. Document Released: 03/24/2004 Document Revised: 07/01/2013 Document Reviewed: 08/17/2012 [employer] Patient Information 2015 [employer]. This information is not intended to replace advice given   to you by your health care provider. Make sure you discuss any questions you have with your health care provider.  

## 2014-08-04 NOTE — Progress Notes (Signed)
Established Carotid Patient   History of Present Illness  Robert Bowers is a 80 y.o. male patient of Dr. Kellie Simmering who is s/p left CEA in 2013, returns today for follow up.  He had a TIA sometime before the 2013 CEA, no TIA or stroke activity since then; this manifested as transient unilateral leg weakness, he does not remember which leg, denies any other neurological symptoms; he no longer has leg weakness.  Pt denies New Medical or Surgical History.  Pt Diabetic: No Pt smoker: former smoker, quit in the 1990's  Pt meds include: Statin : Yes ASA: Yes Other anticoagulants/antiplatelets: no  Past Medical History  Diagnosis Date  . Hypertension   . Hyperlipidemia   . Stroke   . Carotid artery occlusion   . COPD (chronic obstructive pulmonary disease)   . GERD (gastroesophageal reflux disease)   . Complication of anesthesia     difficulty awakening from anesthesia, & N & V  . PONV (postoperative nausea and vomiting)   . Shortness of breath     at times due to COPD.  Marland Kitchen Recurrent upper respiratory infection (URI)     last bout of bronchitis 04/2011.    Social History History  Substance Use Topics  . Smoking status: Former Smoker -- 1.00 packs/day for 35 years    Types: Cigarettes    Quit date: 06/03/1991  . Smokeless tobacco: Never Used  . Alcohol Use: 7.2 oz/week    12 Cans of beer per week    Family History Family History  Problem Relation Age of Onset  . Heart disease Mother     Heart Disease before age 40  . Hypertension Mother   . Hyperlipidemia Mother   . Heart attack Mother   . Anesthesia problems Neg Hx   . Kidney disease Father     Surgical History Past Surgical History  Procedure Laterality Date  . Appendectomy    . Joint replacement      Elbow-shoulder  . Kidney surgery    . Endarterectomy  06/29/2011    Procedure: ENDARTERECTOMY CAROTID;  Surgeon: Mal Misty, MD;  Location: [facility];  Service: Vascular;  Laterality: Left;  . Elbow surgery    .  Shoulder surgery    . Eye surgery  Aug. and Dec. 2012    Detatched retina and Macular Deg.  . Eye surgery Bilateral Feb. 2015    Eyelid / Eyebrow    Allergies  Allergen Reactions  . Vicodin [Hydrocodone-Acetaminophen] Other (See Comments)    Hallucinations.  . Tape Itching and Rash    Paper Tape is okay.    Current Outpatient Prescriptions  Medication Sig Dispense Refill  . amLODipine (NORVASC) 5 MG tablet Take 5 mg by mouth daily.    Marland Kitchen aspirin 325 MG tablet Take 325 mg by mouth daily.    Marland Kitchen azithromycin (ZITHROMAX) 250 MG tablet Take 2 tabs PO x 1 dose, then 1 tab PO QD x 4 days 6 tablet 0  . chlorpheniramine-HYDROcodone (TUSSIONEX PENNKINETIC ER) 10-8 MG/5ML LQCR Take 5 mLs by mouth every 12 (twelve) hours as needed. 60 mL 0  . ibuprofen (ADVIL,MOTRIN) 200 MG tablet Take 200 mg by mouth as needed. For pain    . losartan (COZAAR) 100 MG tablet Take 50 mg by mouth daily.    Marland Kitchen nystatin (MYCOSTATIN) 100000 UNIT/ML suspension Take 5 mLs (500,000 Units total) by mouth 4 (four) times daily. 120 mL 0  . omeprazole (PRILOSEC) 20 MG capsule Take 20 mg by  mouth daily.    . pravastatin (PRAVACHOL) 40 MG tablet Take 40 mg by mouth at bedtime.     No current facility-administered medications for this visit.    Review of Systems : See HPI for pertinent positives and negatives.  Physical Examination  Filed Vitals:   08/04/14 1340 08/04/14 1341  BP: 144/72 144/74  Pulse: 62 63  Resp:  16  Height:  5' 11.5" (1.816 m)  Weight:  169 lb (76.658 kg)  SpO2:  99%   Body mass index is 23.24 kg/(m^2).   General: WDWN male in NAD GAIT: normal Eyes: PERRLA Pulmonary: Non-labored, CTAB, Negative Rales, Negative rhonchi, & Negative wheezing.  Cardiac: regular Rhythm , Negative detected murmur.  VASCULAR EXAM Carotid Bruits Left Right   Negative Negative   Radial pulses are 2+ palpable and equal.       LE Pulses LEFT RIGHT   POPLITEAL not palpable  not palpable   POSTERIOR TIBIAL  palpable   palpable    DORSALIS PEDIS  ANTERIOR TIBIAL palpable  palpable     Gastrointestinal: soft, nontender, BS WNL, no r/g,no palpable masses.  Musculoskeletal: Negative muscle atrophy/wasting. M/S 5/5 throughout, Extremities without ischemic changes.  Neurologic: A&O X 3; Appropriate Affect,  Speech is normal CN 2-12 intact, Pain and light touch intact in extremities, Motor exam as listed above.           Non-Invasive Vascular Imaging CAROTID DUPLEX 08/04/2014   CEREBROVASCULAR DUPLEX EVALUATION    INDICATION: Carotid artery disease     PREVIOUS INTERVENTION(S): Left carotid endarterectomy 06/29/2011.    DUPLEX EXAM:     RIGHT  LEFT  Peak Systolic Velocities (cm/s) End Diastolic Velocities (cm/s) Plaque LOCATION Peak Systolic Velocities (cm/s) End Diastolic Velocities (cm/s) Plaque  75 14  CCA PROXIMAL 96 11   63 11  CCA MID 75 15   69 12 HT CCA DISTAL 78 10   112 12 HT ECA 101 14   71 11 HT ICA PROXIMAL 34 7   108 26  ICA MID 64 15   65 18  ICA DISTAL 86 20     1.71 ICA / CCA Ratio (PSV) NA  Antegrade  Vertebral Flow Antegrade   665 Brachial Systolic Pressure (mmHg) 993  Multiphasic (Subclavian artery) Brachial Artery Waveforms Multiphasic (Subclavian artery)    Plaque Morphology:  HM = Homogeneous, HT = Heterogeneous, CP = Calcific Plaque, SP = Smooth Plaque, IP = Irregular Plaque  ADDITIONAL FINDINGS:     IMPRESSION: Right internal carotid artery velocities suggest a 1-49% stenosis.  Patent left carotid endarterectomy site with no evidence of hyperplasia or restenosis.     Compared to the previous exam:  No significant change in comparison to the last exam on 07/23/2013.      Assessment: Robert Bowers is a 80 y.o. male who is s/p  left CEA in 2013. He had a TIA before the 2013 left CEA, has had no subsequent TIA or stroke. Today's carotid Duplex suggests  1-49% right ICA stenosis and a patent left carotid endarterectomy site with no evidence of hyperplasia or restenosis.  No significant change in comparison to the last exam on 07/23/2013.    Plan: Follow-up in 1 year with Carotid Duplex.   I discussed in depth with the patient the nature of atherosclerosis, and emphasized the importance of maximal medical management including strict control of blood pressure, blood glucose, and lipid levels, obtaining regular exercise, and continued cessation of smoking.  The patient is  aware that without maximal medical management the underlying atherosclerotic disease process will progress, limiting the benefit of any interventions. The patient was given information about stroke prevention and what symptoms should prompt the patient to seek immediate medical care. Thank you for allowing [place] to participate in this patient's care.  Clemon Chambers, RN, MSN, FNP-C [facility] Office: [phone]  Clinic Physician: Kellie Simmering  08/04/2014 1:18 PM

## 2014-08-05 NOTE — Addendum Note (Signed)
Addended by: Dorthula Rue L on: 08/05/2014 02:40 PM   Modules accepted: Orders

## 2014-08-21 ENCOUNTER — Encounter: Payer: Self-pay | Admitting: Internal Medicine

## 2014-09-09 ENCOUNTER — Ambulatory Visit
Admission: RE | Admit: 2014-09-09 | Discharge: 2014-09-09 | Disposition: A | Discharge status: Home / Self Care | Payer: No Typology Code available for payment source | Source: Ambulatory Visit | Attending: Family Medicine | Admitting: Family Medicine

## 2014-09-09 ENCOUNTER — Other Ambulatory Visit: Payer: Self-pay | Admitting: Family Medicine

## 2014-09-09 DIAGNOSIS — J449 Chronic obstructive pulmonary disease, unspecified: Secondary | ICD-10-CM

## 2014-11-05 DIAGNOSIS — E785 Hyperlipidemia, unspecified: Secondary | ICD-10-CM | POA: Diagnosis not present

## 2014-11-05 DIAGNOSIS — I10 Essential (primary) hypertension: Secondary | ICD-10-CM | POA: Diagnosis not present

## 2014-11-05 DIAGNOSIS — Z0000 Encounter for general adult medical examination without abnormal findings: Secondary | ICD-10-CM | POA: Diagnosis not present

## 2014-11-12 DIAGNOSIS — E785 Hyperlipidemia, unspecified: Secondary | ICD-10-CM | POA: Diagnosis not present

## 2014-11-12 DIAGNOSIS — N183 Chronic kidney disease, stage 3 (moderate): Secondary | ICD-10-CM | POA: Diagnosis not present

## 2014-11-12 DIAGNOSIS — K219 Gastro-esophageal reflux disease without esophagitis: Secondary | ICD-10-CM | POA: Diagnosis not present

## 2014-11-12 DIAGNOSIS — Z0000 Encounter for general adult medical examination without abnormal findings: Secondary | ICD-10-CM | POA: Diagnosis not present

## 2014-11-12 DIAGNOSIS — I129 Hypertensive chronic kidney disease with stage 1 through stage 4 chronic kidney disease, or unspecified chronic kidney disease: Secondary | ICD-10-CM | POA: Insufficient documentation

## 2014-11-12 DIAGNOSIS — I69959 Hemiplegia and hemiparesis following unspecified cerebrovascular disease affecting unspecified side: Secondary | ICD-10-CM | POA: Diagnosis not present

## 2014-11-12 DIAGNOSIS — J449 Chronic obstructive pulmonary disease, unspecified: Secondary | ICD-10-CM | POA: Diagnosis not present

## 2014-11-12 DIAGNOSIS — I10 Essential (primary) hypertension: Secondary | ICD-10-CM | POA: Diagnosis not present

## 2014-11-12 DIAGNOSIS — I6529 Occlusion and stenosis of unspecified carotid artery: Secondary | ICD-10-CM | POA: Diagnosis not present

## 2014-11-12 HISTORY — DX: Hypertensive chronic kidney disease with stage 1 through stage 4 chronic kidney disease, or unspecified chronic kidney disease: I12.9

## 2014-11-18 DIAGNOSIS — Z1212 Encounter for screening for malignant neoplasm of rectum: Secondary | ICD-10-CM | POA: Diagnosis not present

## 2014-12-26 DIAGNOSIS — H2513 Age-related nuclear cataract, bilateral: Secondary | ICD-10-CM | POA: Diagnosis not present

## 2014-12-26 DIAGNOSIS — H31001 Unspecified chorioretinal scars, right eye: Secondary | ICD-10-CM | POA: Diagnosis not present

## 2014-12-26 DIAGNOSIS — H5202 Hypermetropia, left eye: Secondary | ICD-10-CM | POA: Diagnosis not present

## 2014-12-26 DIAGNOSIS — H3581 Retinal edema: Secondary | ICD-10-CM | POA: Diagnosis not present

## 2015-01-13 ENCOUNTER — Encounter: Payer: Self-pay | Admitting: Internal Medicine

## 2015-01-27 DIAGNOSIS — H43812 Vitreous degeneration, left eye: Secondary | ICD-10-CM | POA: Diagnosis not present

## 2015-01-27 DIAGNOSIS — H59031 Cystoid macular edema following cataract surgery, right eye: Secondary | ICD-10-CM | POA: Diagnosis not present

## 2015-02-20 ENCOUNTER — Encounter: Payer: Self-pay | Admitting: Internal Medicine

## 2015-02-25 ENCOUNTER — Ambulatory Visit (AMBULATORY_SURGERY_CENTER): Payer: Self-pay

## 2015-02-25 VITALS — Ht 72.0 in | Wt 175.0 lb

## 2015-02-25 DIAGNOSIS — Z1211 Encounter for screening for malignant neoplasm of colon: Secondary | ICD-10-CM

## 2015-02-25 MED ORDER — SUPREP BOWEL PREP KIT 17.5-3.13-1.6 GM/177ML PO SOLN: 1.0000 | Freq: Once | ORAL | Status: DC

## 2015-02-25 NOTE — Progress Notes (Signed)
No allergies to eggs or soy No diet/weight loss meds PONV with general No home oxygen  Has email and internet; refused emmi

## 2015-03-10 ENCOUNTER — Ambulatory Visit (AMBULATORY_SURGERY_CENTER): Payer: Commercial Managed Care - HMO | Admitting: Internal Medicine

## 2015-03-10 ENCOUNTER — Encounter: Payer: Self-pay | Admitting: Internal Medicine

## 2015-03-10 VITALS — BP 137/77 | HR 64 | Temp 97.5°F | Resp 16 | Ht 72.0 in | Wt 175.0 lb

## 2015-03-10 DIAGNOSIS — Z8673 Personal history of transient ischemic attack (TIA), and cerebral infarction without residual deficits: Secondary | ICD-10-CM | POA: Diagnosis not present

## 2015-03-10 DIAGNOSIS — I6529 Occlusion and stenosis of unspecified carotid artery: Secondary | ICD-10-CM | POA: Diagnosis not present

## 2015-03-10 DIAGNOSIS — Z1211 Encounter for screening for malignant neoplasm of colon: Secondary | ICD-10-CM

## 2015-03-10 DIAGNOSIS — I10 Essential (primary) hypertension: Secondary | ICD-10-CM | POA: Diagnosis not present

## 2015-03-10 DIAGNOSIS — K5730 Diverticulosis of large intestine without perforation or abscess without bleeding: Secondary | ICD-10-CM | POA: Diagnosis not present

## 2015-03-10 DIAGNOSIS — K219 Gastro-esophageal reflux disease without esophagitis: Secondary | ICD-10-CM | POA: Diagnosis not present

## 2015-03-10 DIAGNOSIS — J449 Chronic obstructive pulmonary disease, unspecified: Secondary | ICD-10-CM | POA: Diagnosis not present

## 2015-03-10 MED ORDER — SODIUM CHLORIDE 0.9 % IV SOLN: 500.0000 mL | INTRAVENOUS | Status: DC

## 2015-03-10 NOTE — Patient Instructions (Signed)
YOU HAD AN ENDOSCOPIC PROCEDURE TODAY AT THE [facility]:   Refer to the procedure report that was given to you for any specific questions about what was found during the examination.  If the procedure report does not answer your questions, please call your gastroenterologist to clarify.  If you requested that your care partner not be given the details of your procedure findings, then the procedure report has been included in a sealed envelope for you to review at your convenience later.  YOU SHOULD EXPECT: Some feelings of bloating in the abdomen. Passage of more gas than usual.  Walking can help get rid of the air that was put into your GI tract during the procedure and reduce the bloating. If you had a lower endoscopy (such as a colonoscopy or flexible sigmoidoscopy) you may notice spotting of blood in your stool or on the toilet paper. If you underwent a bowel prep for your procedure, you may not have a normal bowel movement for a few days.  Please Note:  You might notice some irritation and congestion in your nose or some drainage.  This is from the oxygen used during your procedure.  There is no need for concern and it should clear up in a day or so.  SYMPTOMS TO REPORT IMMEDIATELY:   Following lower endoscopy (colonoscopy or flexible sigmoidoscopy):  Excessive amounts of blood in the stool  Significant tenderness or worsening of abdominal pains  Swelling of the abdomen that is new, acute  Fever of 100F or higher   For urgent or emergent issues, a gastroenterologist can be reached at any hour by calling [phone].   DIET: Your first meal following the procedure should be a small meal and then it is ok to progress to your normal diet. Heavy or fried foods are harder to digest and may make you feel nauseous or bloated.  Likewise, meals heavy in dairy and vegetables can increase bloating.  Drink plenty of fluids but you should avoid alcoholic beverages for 24  hours.  ACTIVITY:  You should plan to take it easy for the rest of today and you should NOT DRIVE or use heavy machinery until tomorrow (because of the sedation medicines used during the test).    FOLLOW UP: Our staff will call the number listed on your records the next business day following your procedure to check on you and address any questions or concerns that you may have regarding the information given to you following your procedure. If we do not reach you, we will leave a message.  However, if you are feeling well and you are not experiencing any problems, there is no need to return our call.  We will assume that you have returned to your regular daily activities without incident.  If any biopsies were taken you will be contacted by phone or by letter within the next 1-3 weeks.  Please call us at [phone] if you have not heard about the biopsies in 3 weeks.    SIGNATURES/CONFIDENTIALITY: You and/or your care partner have signed paperwork which will be entered into your electronic medical record.  These signatures attest to the fact that that the information above on your After Visit Summary has been reviewed and is understood.  Full responsibility of the confidentiality of this discharge information lies with you and/or your care-partner. 

## 2015-03-10 NOTE — Progress Notes (Signed)
Report to PACU, RN, vss, BBS= Clear.  

## 2015-03-10 NOTE — Op Note (Signed)
[facility]  [address] [place], [address]   COLONOSCOPY PROCEDURE REPORT  PATIENT: Robert Bowers, Robert Bowers  MR#: [id number] BIRTHDATE: [birth year] , 80  yrs. old GENDER: male ENDOSCOPIST: Eustace Quail, MD REFERRED [id number] Recall, PROCEDURE DATE:  03/10/2015 PROCEDURE:   Colonoscopy, screening First Screening Colonoscopy - Avg.  risk and is 50 yrs.  old or older - No.  Prior Negative Screening - Now for repeat screening. 10 or more years since last screening  History of Adenoma - Now for follow-up colonoscopy & has been > or = to 3 yrs.  N/A  Polyps removed today? No Recommend repeat exam, <10 yrs? No ASA CLASS:   Class II INDICATIONS:Screening for colonic neoplasia and Colorectal Neoplasm Risk Assessment for this procedure is average risk. Index examination August 2006 with diverticulosis but no polyps. MEDICATIONS: Monitored anesthesia care and Propofol 260 mg IV  DESCRIPTION OF PROCEDURE:   After the risks benefits and alternatives of the procedure were thoroughly explained, informed consent was obtained.  The digital rectal exam revealed no abnormalities of the rectum.   The LB [id number]  endoscope was introduced through the anus and advanced to the cecum, which was identified by both the appendix and ileocecal valve. No adverse events experienced.   The quality of the prep was excellent. (Suprep was used)  The instrument was then slowly withdrawn as the colon was fully examined. Estimated blood loss is zero unless otherwise noted in this procedure report.      COLON FINDINGS: There was severe diverticulosis noted in the right colon and left colon.   The examination was otherwise normal. Retroflexed views revealed internal hemorrhoids. The time to cecum = 4.3 Withdrawal time = 11.6   The scope was withdrawn and the procedure completed. COMPLICATIONS: There were no immediate complications.  ENDOSCOPIC IMPRESSION: 1.   Severe diverticulosis was  noted in the right colon and left colon 2.   The examination was otherwise normal  RECOMMENDATIONS: 1. Return to the care of your primary provider.  GI follow up as needed  eSigned:  Eustace Quail, MD 03/10/2015 2:25 PM   cc: The Patient and W.  Lutricia Feil, MD

## 2015-03-11 ENCOUNTER — Telehealth: Payer: Self-pay

## 2015-03-11 NOTE — Telephone Encounter (Signed)
  Follow up Call-  Call back number 03/10/2015  Post procedure Call Back phone  # [phone]  Permission to leave phone message Yes     Patient questions:  Do you have a fever, pain , or abdominal swelling? No. Pain Score  0 *  Have you tolerated food without any problems? Yes.    Have you been able to return to your normal activities? Yes.    Do you have any questions about your discharge instructions: Diet   No. Medications  No. Follow up visit  No.  Do you have questions or concerns about your Care? No.  Actions: * If pain score is 4 or above: No action needed, pain <4.

## 2015-04-03 DIAGNOSIS — L237 Allergic contact dermatitis due to plants, except food: Secondary | ICD-10-CM | POA: Diagnosis not present

## 2015-04-03 DIAGNOSIS — Z6825 Body mass index (BMI) 25.0-25.9, adult: Secondary | ICD-10-CM | POA: Diagnosis not present

## 2015-04-06 DIAGNOSIS — L237 Allergic contact dermatitis due to plants, except food: Secondary | ICD-10-CM | POA: Diagnosis not present

## 2015-04-06 DIAGNOSIS — Z6824 Body mass index (BMI) 24.0-24.9, adult: Secondary | ICD-10-CM | POA: Diagnosis not present

## 2015-04-30 DIAGNOSIS — Z6824 Body mass index (BMI) 24.0-24.9, adult: Secondary | ICD-10-CM | POA: Diagnosis not present

## 2015-04-30 DIAGNOSIS — I6529 Occlusion and stenosis of unspecified carotid artery: Secondary | ICD-10-CM | POA: Diagnosis not present

## 2015-04-30 DIAGNOSIS — L309 Dermatitis, unspecified: Secondary | ICD-10-CM | POA: Diagnosis not present

## 2015-04-30 DIAGNOSIS — J449 Chronic obstructive pulmonary disease, unspecified: Secondary | ICD-10-CM | POA: Diagnosis not present

## 2015-04-30 DIAGNOSIS — I69959 Hemiplegia and hemiparesis following unspecified cerebrovascular disease affecting unspecified side: Secondary | ICD-10-CM | POA: Diagnosis not present

## 2015-04-30 DIAGNOSIS — E784 Other hyperlipidemia: Secondary | ICD-10-CM | POA: Diagnosis not present

## 2015-04-30 DIAGNOSIS — I10 Essential (primary) hypertension: Secondary | ICD-10-CM | POA: Diagnosis not present

## 2015-04-30 DIAGNOSIS — N183 Chronic kidney disease, stage 3 (moderate): Secondary | ICD-10-CM | POA: Diagnosis not present

## 2015-08-06 ENCOUNTER — Encounter: Payer: Self-pay | Admitting: Family

## 2015-08-11 ENCOUNTER — Encounter: Payer: Self-pay | Admitting: Family

## 2015-08-11 ENCOUNTER — Ambulatory Visit (HOSPITAL_COMMUNITY)
Admission: RE | Admit: 2015-08-11 | Discharge: 2015-08-11 | Disposition: A | Discharge status: Home / Self Care | Payer: Commercial Managed Care - HMO | Source: Ambulatory Visit | Attending: Family | Admitting: Family

## 2015-08-11 ENCOUNTER — Ambulatory Visit (INDEPENDENT_AMBULATORY_CARE_PROVIDER_SITE_OTHER): Payer: Commercial Managed Care - HMO | Admitting: Family

## 2015-08-11 VITALS — BP 145/76 | HR 68 | Temp 97.1°F | Resp 18 | Ht 72.0 in | Wt 167.0 lb

## 2015-08-11 DIAGNOSIS — E785 Hyperlipidemia, unspecified: Secondary | ICD-10-CM | POA: Insufficient documentation

## 2015-08-11 DIAGNOSIS — Z9889 Other specified postprocedural states: Secondary | ICD-10-CM | POA: Insufficient documentation

## 2015-08-11 DIAGNOSIS — K219 Gastro-esophageal reflux disease without esophagitis: Secondary | ICD-10-CM | POA: Insufficient documentation

## 2015-08-11 DIAGNOSIS — I6521 Occlusion and stenosis of right carotid artery: Secondary | ICD-10-CM | POA: Insufficient documentation

## 2015-08-11 DIAGNOSIS — Z48812 Encounter for surgical aftercare following surgery on the circulatory system: Secondary | ICD-10-CM

## 2015-08-11 DIAGNOSIS — I10 Essential (primary) hypertension: Secondary | ICD-10-CM | POA: Insufficient documentation

## 2015-08-11 DIAGNOSIS — Z87891 Personal history of nicotine dependence: Secondary | ICD-10-CM

## 2015-08-11 DIAGNOSIS — I6522 Occlusion and stenosis of left carotid artery: Secondary | ICD-10-CM

## 2015-08-11 NOTE — Progress Notes (Signed)
Chief Complaint: Follow up Extracranial Carotid Artery Stenosis   History of Present Illness  Robert Bowers is a 81 y.o. male patient of Dr. Kellie Simmering who is s/p left CEA in 2013, returns today for follow up.  He had a TIA sometime before the 2013 CEA, no TIA or stroke activity since then; this manifested as transient unilateral leg weakness, he does not remember which leg, denies any other neurological symptoms; he no longer has leg weakness.  He denies claudication sx's with walking, but c/o pain in right knee and hip only when he climbs stairs but not all the time with this activity.   Pt states his blood pressure at home is [id number] systolic.   Pt denies New Medical or Surgical History.  Pt Diabetic: No Pt smoker: former smoker, quit in the 1990's  Pt meds include: Statin : Yes ASA: Yes Other anticoagulants/antiplatelets: no  Past Medical History  Diagnosis Date  . Hypertension   . Hyperlipidemia   . Stroke ([place])   . Carotid artery occlusion   . COPD (chronic obstructive pulmonary disease) ([place])   . GERD (gastroesophageal reflux disease)   . Complication of anesthesia     difficulty awakening from anesthesia, & N & V  . PONV (postoperative nausea and vomiting)   . Shortness of breath     at times due to COPD.  Marland Kitchen Recurrent upper respiratory infection (URI)     last bout of bronchitis 04/2011.    Social History Social History  Substance Use Topics  . Smoking status: Former Smoker -- 1.00 packs/day for 35 years    Types: Cigarettes    Quit date: 06/03/1991  . Smokeless tobacco: Never Used  . Alcohol Use: 7.2 oz/week    12 Cans of beer per week    Family History Family History  Problem Relation Age of Onset  . Heart disease Mother     Heart Disease before age 56  . Hypertension Mother   . Hyperlipidemia Mother   . Heart attack Mother   . Varicose Veins Mother   . Anesthesia problems Neg Hx   . Colon cancer Neg Hx   . Kidney disease Father     Surgical  History Past Surgical History  Procedure Laterality Date  . Appendectomy    . Joint replacement      Elbow-shoulder  . Kidney surgery    . Endarterectomy  06/29/2011    Procedure: ENDARTERECTOMY CAROTID;  Surgeon: Mal Misty, MD;  Location: [facility];  Service: Vascular;  Laterality: Left;  . Elbow surgery    . Shoulder surgery    . Eye surgery  Aug. and Dec. 2012    Detatched retina and Macular Deg.  . Eye surgery Bilateral Feb. 2015    Eyelid / Eyebrow    Allergies  Allergen Reactions  . Vicodin [Hydrocodone-Acetaminophen] Other (See Comments)    Hallucinations.  . Latex Rash  . Tape Itching and Rash    Paper Tape is okay.    Current Outpatient Prescriptions  Medication Sig Dispense Refill  . amLODipine (NORVASC) 10 MG tablet daily.    Marland Kitchen aspirin 81 MG tablet Take 81 mg by mouth daily.    . Doxylamine Succinate, Sleep, (UNISOM PO) Take by mouth at bedtime.    . hydrochlorothiazide (HYDRODIURIL) 12.5 MG tablet daily.    Marland Kitchen ibuprofen (ADVIL,MOTRIN) 200 MG tablet Take 200 mg by mouth as needed. For pain    . omeprazole (PRILOSEC) 20 MG capsule Take 20 mg  by mouth daily.     No current facility-administered medications for this visit.    Review of Systems : See HPI for pertinent positives and negatives.  Physical Examination  Filed Vitals:   08/11/15 1331 08/11/15 1334 08/11/15 1335  BP: 143/73 150/78 145/76  Pulse: 73 68 68  Temp: 97.1 F (36.2 C)    Resp: 18    Height: 6' (1.829 m)    Weight: 167 lb (75.751 kg)    SpO2: 96%     Body mass index is 22.64 kg/(m^2).  General: WDWN male in NAD GAIT: normal Eyes: PERRLA Pulmonary: Respirations are non-labored, CTAB Cardiac: regular rhythm, no detected murmur.  VASCULAR EXAM Carotid Bruits Left Right   Negative Negative   Radial pulses are 2+ palpable and equal.       LE Pulses LEFT RIGHT   POPLITEAL not palpable  not palpable   POSTERIOR TIBIAL palpable  palpable    DORSALIS PEDIS  ANTERIOR TIBIAL palpable  palpable     Gastrointestinal: soft, nontender, BS WNL, no r/g,no palpable masses.  Musculoskeletal: No muscle atrophy/wasting. M/S 5/5 throughout, Extremities without ischemic changes.  Neurologic: A&O X 3; Appropriate Affect,  Speech is normal CN 2-12 intact, Pain and light touch intact in extremities, Motor exam as listed above.               Non-Invasive Vascular Imaging CAROTID DUPLEX 08/11/2015   Right ICA: 1 - 39 % stenosis. Left ICA: CEA site with no restenosis, with mild hyperplasia at the patch. Minimal change compared to 08/04/14 exam.    Assessment: [name] is a 81 y.o. male who is s/p left CEA in 2013. He had a TIA before the 2013 left CEA, has had no subsequent TIA or stroke. Today's carotid Duplex suggests 1 - 39 % right ICA stenosis. Left ICA: CEA site with no restenosis, with mild hyperplasia at the patch. Minimal change compared to 08/04/14 exam.   Fortunately he does not have DM and quit smoking in the 1990's. He has a normal BMI, stays active, and takes a statin and ASA regularly.   Plan: Follow-up in 1 year with Carotid Duplex scan.   I discussed in depth with the patient the nature of atherosclerosis, and emphasized the importance of maximal medical management including strict control of blood pressure, blood glucose, and lipid levels, obtaining regular exercise, and continued cessation of smoking.  The patient is aware that without maximal medical management the underlying atherosclerotic disease process will progress, limiting the benefit of any interventions. The patient was given information about stroke prevention and what symptoms should prompt the patient to seek immediate medical care. Thank you for allowing [place] to participate in this patient's  care.  Clemon Chambers, RN, MSN, FNP-C [facility] Office: [facility] Physician: Kellie Simmering  08/11/2015 1:37 PM

## 2015-08-11 NOTE — Progress Notes (Signed)
Filed Vitals:   08/11/15 1331 08/11/15 1334 08/11/15 1335  BP: 143/73 150/78 145/76  Pulse: 73 68 68  Temp: 97.1 F (36.2 C)    Resp: 18    Height: 6' (1.829 m)    Weight: 167 lb (75.751 kg)    SpO2: 96%

## 2015-08-11 NOTE — Patient Instructions (Signed)
Stroke Prevention Some medical conditions and behaviors are associated with an increased chance of having a stroke. You may prevent a stroke by making healthy choices and managing medical conditions. HOW CAN I REDUCE MY RISK OF HAVING A STROKE?   Stay physically active. Get at least 30 minutes of activity on most or all days.  Do not smoke. It may also be helpful to avoid exposure to secondhand smoke.  Limit alcohol use. Moderate alcohol use is considered to be:  No more than 2 drinks per day for men.  No more than 1 drink per day for nonpregnant women.  Eat healthy foods. This involves:  Eating 5 or more servings of fruits and vegetables a day.  Making dietary changes that address high blood pressure (hypertension), high cholesterol, diabetes, or obesity.  Manage your cholesterol levels.  Making food choices that are high in fiber and low in saturated fat, trans fat, and cholesterol may control cholesterol levels.  Take any prescribed medicines to control cholesterol as directed by your health care provider.  Manage your diabetes.  Controlling your carbohydrate and sugar intake is recommended to manage diabetes.  Take any prescribed medicines to control diabetes as directed by your health care provider.  Control your hypertension.  Making food choices that are low in salt (sodium), saturated fat, trans fat, and cholesterol is recommended to manage hypertension.  Ask your health care provider if you need treatment to lower your blood pressure. Take any prescribed medicines to control hypertension as directed by your health care provider.  If you are 18-39 years of age, have your blood pressure checked every 3-5 years. If you are 40 years of age or older, have your blood pressure checked every year.  Maintain a healthy weight.  Reducing calorie intake and making food choices that are low in sodium, saturated fat, trans fat, and cholesterol are recommended to manage  weight.  Stop drug abuse.  Avoid taking birth control pills.  Talk to your health care provider about the risks of taking birth control pills if you are over 35 years old, smoke, get migraines, or have ever had a blood clot.  Get evaluated for sleep disorders (sleep apnea).  Talk to your health care provider about getting a sleep evaluation if you snore a lot or have excessive sleepiness.  Take medicines only as directed by your health care provider.  For some people, aspirin or blood thinners (anticoagulants) are helpful in reducing the risk of forming abnormal blood clots that can lead to stroke. If you have the irregular heart rhythm of atrial fibrillation, you should be on a blood thinner unless there is a good reason you cannot take them.  Understand all your medicine instructions.  Make sure that other conditions (such as anemia or atherosclerosis) are addressed. SEEK IMMEDIATE MEDICAL CARE IF:   You have sudden weakness or numbness of the face, arm, or leg, especially on one side of the body.  Your face or eyelid droops to one side.  You have sudden confusion.  You have trouble speaking (aphasia) or understanding.  You have sudden trouble seeing in one or both eyes.  You have sudden trouble walking.  You have dizziness.  You have a loss of balance or coordination.  You have a sudden, severe headache with no known cause.  You have new chest pain or an irregular heartbeat. Any of these symptoms may represent a serious problem that is an emergency. Do not wait to see if the symptoms will   go away. Get medical help at once. Call your local emergency services (911 in [place] Do not drive yourself to the hospital.   This information is not intended to replace advice given to you by your health care provider. Make sure you discuss any questions you have with your health care provider.   Document Released: 03/24/2004 Document Revised: 03/07/2014 Document Reviewed:  08/17/2012 Elsevier Interactive Patient Education 2016 [employer]  

## 2015-09-24 NOTE — Addendum Note (Signed)
Addended by: Thresa Ross C on: 09/24/2015 01:56 PM   Modules accepted: Orders

## 2015-11-26 DIAGNOSIS — E784 Other hyperlipidemia: Secondary | ICD-10-CM | POA: Diagnosis not present

## 2015-11-26 DIAGNOSIS — I10 Essential (primary) hypertension: Secondary | ICD-10-CM | POA: Diagnosis not present

## 2015-12-04 DIAGNOSIS — I10 Essential (primary) hypertension: Secondary | ICD-10-CM | POA: Diagnosis not present

## 2015-12-04 DIAGNOSIS — Z0000 Encounter for general adult medical examination without abnormal findings: Secondary | ICD-10-CM | POA: Diagnosis not present

## 2015-12-04 DIAGNOSIS — E784 Other hyperlipidemia: Secondary | ICD-10-CM | POA: Diagnosis not present

## 2015-12-04 DIAGNOSIS — N183 Chronic kidney disease, stage 3 (moderate): Secondary | ICD-10-CM | POA: Diagnosis not present

## 2015-12-04 DIAGNOSIS — I129 Hypertensive chronic kidney disease with stage 1 through stage 4 chronic kidney disease, or unspecified chronic kidney disease: Secondary | ICD-10-CM | POA: Diagnosis not present

## 2015-12-04 DIAGNOSIS — I69959 Hemiplegia and hemiparesis following unspecified cerebrovascular disease affecting unspecified side: Secondary | ICD-10-CM | POA: Diagnosis not present

## 2015-12-04 DIAGNOSIS — K219 Gastro-esophageal reflux disease without esophagitis: Secondary | ICD-10-CM | POA: Diagnosis not present

## 2015-12-04 DIAGNOSIS — J449 Chronic obstructive pulmonary disease, unspecified: Secondary | ICD-10-CM | POA: Diagnosis not present

## 2015-12-04 DIAGNOSIS — I6529 Occlusion and stenosis of unspecified carotid artery: Secondary | ICD-10-CM | POA: Diagnosis not present

## 2016-01-25 DIAGNOSIS — H31092 Other chorioretinal scars, left eye: Secondary | ICD-10-CM | POA: Diagnosis not present

## 2016-01-25 DIAGNOSIS — H35371 Puckering of macula, right eye: Secondary | ICD-10-CM | POA: Diagnosis not present

## 2016-01-25 DIAGNOSIS — H1851 Endothelial corneal dystrophy: Secondary | ICD-10-CM | POA: Diagnosis not present

## 2016-01-25 DIAGNOSIS — H2512 Age-related nuclear cataract, left eye: Secondary | ICD-10-CM | POA: Diagnosis not present

## 2016-01-25 DIAGNOSIS — Z961 Presence of intraocular lens: Secondary | ICD-10-CM | POA: Diagnosis not present

## 2016-06-10 DIAGNOSIS — J449 Chronic obstructive pulmonary disease, unspecified: Secondary | ICD-10-CM | POA: Diagnosis not present

## 2016-06-10 DIAGNOSIS — I6529 Occlusion and stenosis of unspecified carotid artery: Secondary | ICD-10-CM | POA: Diagnosis not present

## 2016-06-10 DIAGNOSIS — E784 Other hyperlipidemia: Secondary | ICD-10-CM | POA: Diagnosis not present

## 2016-06-10 DIAGNOSIS — N183 Chronic kidney disease, stage 3 (moderate): Secondary | ICD-10-CM | POA: Diagnosis not present

## 2016-06-10 DIAGNOSIS — I10 Essential (primary) hypertension: Secondary | ICD-10-CM | POA: Diagnosis not present

## 2016-06-10 DIAGNOSIS — Z6826 Body mass index (BMI) 26.0-26.9, adult: Secondary | ICD-10-CM | POA: Diagnosis not present

## 2016-06-10 DIAGNOSIS — I69959 Hemiplegia and hemiparesis following unspecified cerebrovascular disease affecting unspecified side: Secondary | ICD-10-CM | POA: Diagnosis not present

## 2016-07-19 DIAGNOSIS — H524 Presbyopia: Secondary | ICD-10-CM | POA: Diagnosis not present

## 2016-08-08 ENCOUNTER — Encounter: Payer: Self-pay | Admitting: Family

## 2016-08-16 ENCOUNTER — Encounter: Payer: Self-pay | Admitting: Family

## 2016-08-16 ENCOUNTER — Ambulatory Visit (INDEPENDENT_AMBULATORY_CARE_PROVIDER_SITE_OTHER): Payer: Medicare HMO | Admitting: Family

## 2016-08-16 ENCOUNTER — Ambulatory Visit (HOSPITAL_COMMUNITY)
Admission: RE | Admit: 2016-08-16 | Discharge: 2016-08-16 | Disposition: A | Discharge status: Home / Self Care | Payer: Medicare HMO | Source: Ambulatory Visit | Attending: Family | Admitting: Family

## 2016-08-16 VITALS — BP 158/83 | HR 66 | Temp 97.2°F | Resp 18 | Ht 72.0 in | Wt 188.3 lb

## 2016-08-16 DIAGNOSIS — Z9889 Other specified postprocedural states: Secondary | ICD-10-CM | POA: Diagnosis not present

## 2016-08-16 DIAGNOSIS — Z87891 Personal history of nicotine dependence: Secondary | ICD-10-CM

## 2016-08-16 DIAGNOSIS — Z48812 Encounter for surgical aftercare following surgery on the circulatory system: Secondary | ICD-10-CM | POA: Insufficient documentation

## 2016-08-16 DIAGNOSIS — I872 Venous insufficiency (chronic) (peripheral): Secondary | ICD-10-CM | POA: Diagnosis not present

## 2016-08-16 DIAGNOSIS — I6522 Occlusion and stenosis of left carotid artery: Secondary | ICD-10-CM

## 2016-08-16 LAB — VAS US CAROTID
LCCADDIAS: -15 cm/s
LCCADSYS: -76 cm/s
LCCAPDIAS: 24 cm/s
LEFT ECA DIAS: -25 cm/s
LEFT VERTEBRAL DIAS: -13 cm/s
LICADDIAS: -16 cm/s
LICADSYS: -69 cm/s
Left CCA prox sys: 140 cm/s
Left ICA prox dias: -15 cm/s
Left ICA prox sys: -55 cm/s
RCCADSYS: -84 cm/s
RCCAPDIAS: 20 cm/s
RCCAPSYS: 138 cm/s
RIGHT CCA MID DIAS: 17 cm/s
RIGHT ECA DIAS: -16 cm/s
RIGHT VERTEBRAL DIAS: -9 cm/s

## 2016-08-16 NOTE — Progress Notes (Signed)
Chief Complaint: Follow up Extracranial Carotid Artery Stenosis   History of Present Illness  Robert Bowers is a 82 y.o. male patient of Dr. Kellie Simmering who is s/p left CEA in 2013, returns today for follow up.  He had a TIA sometime before the 2013 CEA, no TIA or stroke activity since then; this manifested as transient unilateral leg weakness, he does not remember which leg, denies any other neurological symptoms; he no longer has leg weakness.  He denies claudication sx's with walking, but c/o pain in right knee and hip only when he climbs stairs but not all the time with this activity.   Pt states his blood pressure at home is 130's/60's.   He reports the mild swelling in his lower legs resolves with overnight elevation of lower extremities.   Pt Diabetic: No Pt smoker: former smoker, quit in the 1990's  Pt meds include: Statin : Yes ASA: Yes Other anticoagulants/antiplatelets: no   Past Medical History:  Diagnosis Date  . Carotid artery occlusion   . Complication of anesthesia    difficulty awakening from anesthesia, & N & V  . COPD (chronic obstructive pulmonary disease) ([place])   . GERD (gastroesophageal reflux disease)   . Hyperlipidemia   . Hypertension   . PONV (postoperative nausea and vomiting)   . Recurrent upper respiratory infection (URI)    last bout of bronchitis 04/2011.  Marland Kitchen Shortness of breath    at times due to COPD.  . Stroke [facility])     Social History Social History  Substance Use Topics  . Smoking status: Former Smoker    Packs/day: 1.00    Years: 35.00    Types: Cigarettes    Quit date: 06/03/1991  . Smokeless tobacco: Never Used  . Alcohol use 7.2 oz/week    12 Cans of beer per week    Family History Family History  Problem Relation Age of Onset  . Heart disease Mother        Heart Disease before age 7  . Hypertension Mother   . Hyperlipidemia Mother   . Heart attack Mother   . Varicose Veins Mother   . Kidney disease Father   .  Anesthesia problems Neg Hx   . Colon cancer Neg Hx     Surgical History Past Surgical History:  Procedure Laterality Date  . APPENDECTOMY    . ELBOW SURGERY    . ENDARTERECTOMY  06/29/2011   Procedure: ENDARTERECTOMY CAROTID;  Surgeon: Mal Misty, MD;  Location: [place];  Service: Vascular;  Laterality: Left;  . EYE SURGERY  Aug. and Dec. 2012   Detatched retina and Macular Deg.  Marland Kitchen EYE SURGERY Bilateral Feb. 2015   Eyelid / Eyebrow  . JOINT REPLACEMENT     Elbow-shoulder  . KIDNEY SURGERY    . SHOULDER SURGERY      Allergies  Allergen Reactions  . Vicodin [Hydrocodone-Acetaminophen] Other (See Comments)    Hallucinations.  . Latex Rash  . Tape Itching and Rash    Paper Tape is okay.    Current Outpatient Prescriptions  Medication Sig Dispense Refill  . amLODipine (NORVASC) 10 MG tablet daily.    Marland Kitchen aspirin 81 MG tablet Take 81 mg by mouth daily.    Marland Kitchen atorvastatin (LIPITOR) 40 MG tablet     . hydrochlorothiazide (HYDRODIURIL) 12.5 MG tablet daily.    Marland Kitchen omeprazole (PRILOSEC) 20 MG capsule Take 20 mg by mouth daily.    . Doxylamine Succinate, Sleep, (UNISOM PO)  Take by mouth at bedtime.    Marland Kitchen ibuprofen (ADVIL,MOTRIN) 200 MG tablet Take 200 mg by mouth as needed. For pain     No current facility-administered medications for this visit.     Review of Systems : See HPI for pertinent positives and negatives.  Physical Examination  Vitals:   08/16/16 1031 08/16/16 1034  BP: (!) 154/81 (!) 158/83  Pulse: 66   Resp: 18   Temp: 97.2 F (36.2 C)   TempSrc: Oral   SpO2: 99%   Weight: 188 lb 4.8 oz (85.4 kg)   Height: 6' (1.829 m)    Body mass index is 25.54 kg/m.  General: WDWN male in NAD GAIT: normal Eyes: PERRLA Pulmonary: Respirations are non-labored, CTAB Cardiac: Regular rhythm and rate, no detected murmur. 1+ bilateral pretibial pitting edema.   VASCULAR EXAM Carotid Bruits Left Right   Negative Negative   Radial pulses are 2+ palpable  and equal.      LE Pulses LEFT RIGHT   POPLITEAL not palpable  not palpable   POSTERIOR TIBIAL Not palpable  not palpable    DORSALIS PEDIS  ANTERIOR TIBIAL not palpable  Not palpable     Gastrointestinal: soft, nontender, BS WNL, no r/g,no palpable masses.  Musculoskeletal: No muscle atrophy/wasting. M/S 5/5 throughout, Extremities without ischemic changes.  Neurologic: A&O X 3; Appropriate Affect, speech is normal, CN 2-12 intact, Pain and light touch intact in extremities, Motor exam as listed above      Assessment: Robert Bowers is a 82 y.o. male who is s/p left CEA in 2013. He had a TIA before the 2013 left CEA, has had no subsequent TIA or stroke.  Fortunately he does not have DM and quit smoking in the 1990's. He has a normal BMI, stays active, and takes a statin and ASA regularly.    Mild chronic venous insufficiency in both lower legs: Elevation of feet when not walking, knee high compression hose, see Patient Instructions.   DATA (08/16/16: Carotid Duplex: 1 - 39 % right ICA stenosis. Left ICA: CEA site with no restenosis, with mild hyperplasia at the proximal patch. Bilateral vertebral artery flow is antegrade.  Bilateral subclavian artery waveforms are normal.  No signficant change compared to exam on 08-11-15.     Plan: Follow-up in 18 months with Carotid Duplex scan.    I discussed in depth with the patient the nature of atherosclerosis, and emphasized the importance of maximal medical management including strict control of blood pressure, blood glucose, and lipid levels, obtaining regular exercise, and continued cessation of smoking.  The patient is aware that without maximal medical management the underlying atherosclerotic disease process will progress, limiting the benefit  of any interventions. The patient was given information about stroke prevention and what symptoms should prompt the patient to seek immediate medical care. Thank you for allowing [place] to participate in this patient's care.  Clemon Chambers, RN, MSN, FNP-C [facility] Office: [phone]  Clinic Physician: Early  08/16/16 12:27 PM

## 2016-08-16 NOTE — Patient Instructions (Signed)
Stroke Prevention Some medical conditions and behaviors are associated with an increased chance of having a stroke. You may prevent a stroke by making healthy choices and managing medical conditions. How can I reduce my risk of having a stroke?  Stay physically active. Get at least 30 minutes of activity on most or all days.  Do not smoke. It may also be helpful to avoid exposure to secondhand smoke.  Limit alcohol use. Moderate alcohol use is considered to be: ? No more than 2 drinks per day for men. ? No more than 1 drink per day for nonpregnant women.  Eat healthy foods. This involves: ? Eating 5 or more servings of fruits and vegetables a day. ? Making dietary changes that address high blood pressure (hypertension), high cholesterol, diabetes, or obesity.  Manage your cholesterol levels. ? Making food choices that are high in fiber and low in saturated fat, trans fat, and cholesterol may control cholesterol levels. ? Take any prescribed medicines to control cholesterol as directed by your health care provider.  Manage your diabetes. ? Controlling your carbohydrate and sugar intake is recommended to manage diabetes. ? Take any prescribed medicines to control diabetes as directed by your health care provider.  Control your hypertension. ? Making food choices that are low in salt (sodium), saturated fat, trans fat, and cholesterol is recommended to manage hypertension. ? Ask your health care provider if you need treatment to lower your blood pressure. Take any prescribed medicines to control hypertension as directed by your health care provider. ? If you are 18-39 years of age, have your blood pressure checked every 3-5 years. If you are 40 years of age or older, have your blood pressure checked every year.  Maintain a healthy weight. ? Reducing calorie intake and making food choices that are low in sodium, saturated fat, trans fat, and cholesterol are recommended to manage  weight.  Stop drug abuse.  Avoid taking birth control pills. ? Talk to your health care provider about the risks of taking birth control pills if you are over 35 years old, smoke, get migraines, or have ever had a blood clot.  Get evaluated for sleep disorders (sleep apnea). ? Talk to your health care provider about getting a sleep evaluation if you snore a lot or have excessive sleepiness.  Take medicines only as directed by your health care provider. ? For some people, aspirin or blood thinners (anticoagulants) are helpful in reducing the risk of forming abnormal blood clots that can lead to stroke. If you have the irregular heart rhythm of atrial fibrillation, you should be on a blood thinner unless there is a good reason you cannot take them. ? Understand all your medicine instructions.  Make sure that other conditions (such as anemia or atherosclerosis) are addressed. Get help right away if:  You have sudden weakness or numbness of the face, arm, or leg, especially on one side of the body.  Your face or eyelid droops to one side.  You have sudden confusion.  You have trouble speaking (aphasia) or understanding.  You have sudden trouble seeing in one or both eyes.  You have sudden trouble walking.  You have dizziness.  You have a loss of balance or coordination.  You have a sudden, severe headache with no known cause.  You have new chest pain or an irregular heartbeat. Any of these symptoms may represent a serious problem that is an emergency. Do not wait to see if the symptoms will go away.   Get medical help at once. Call your local emergency services (911 in [place] Do not drive yourself to the hospital. This information is not intended to replace advice given to you by your health care provider. Make sure you discuss any questions you have with your health care provider. Document Released: 03/24/2004 Document Revised: 07/23/2015 Document Reviewed: 08/17/2012 Elsevier  Interactive Patient Education  2017 Elsevier Inc.      Chronic Venous Insufficiency Chronic venous insufficiency, also called venous stasis, is a condition that prevents blood from being pumped effectively through the veins in your legs. Blood may no longer be pumped effectively from the legs back to the heart. This condition can range from mild to severe. With proper treatment, you should be able to continue with an active life. What are the causes? Chronic venous insufficiency occurs when the vein walls become stretched, weakened, or damaged, or when valves within the vein are damaged. Some common causes of this include:  High blood pressure inside the veins (venous hypertension).  Increased blood pressure in the leg veins from long periods of sitting or standing.  A blood clot that blocks blood flow in a vein (deep vein thrombosis, DVT).  Inflammation of a vein (phlebitis) that causes a blood clot to form.  Tumors in the pelvis that cause blood to back up.  What increases the risk? The following factors may make you more likely to develop this condition:  Having a family history of this condition.  Obesity.  Pregnancy.  Living without enough physical activity or exercise (sedentary lifestyle).  Smoking.  Having a job that requires long periods of standing or sitting in one place.  Being a certain age. Women in their 78s and 50s and men in their 82s are more likely to develop this condition.  What are the signs or symptoms? Symptoms of this condition include:  Veins that are enlarged, bulging, or twisted (varicose veins).  Skin breakdown or ulcers.  Reddened or discolored skin on the front of the leg.  Brown, smooth, tight, and painful skin just above the ankle, usually on the inside of the leg (lipodermatosclerosis).  Swelling.  How is this diagnosed? This condition may be diagnosed based on:  Your medical history.  A physical exam.  Tests, such as: ? A  procedure that creates an image of a blood vessel and nearby organs and provides information about blood flow through the blood vessel (duplex ultrasound). ? A procedure that tests blood flow (plethysmography). ? A procedure to look at the veins using X-ray and dye (venogram).  How is this treated? The goals of treatment are to help you return to an active life and to minimize pain or disability. Treatment depends on the severity of your condition, and it may include:  Wearing compression stockings. These can help relieve symptoms and help prevent your condition from getting worse. However, they do not cure the condition.  Sclerotherapy. This is a procedure involving an injection of a material that "dissolves" damaged veins.  Surgery. This may involve: ? Removing a diseased vein (vein stripping). ? Cutting off blood flow through the vein (laser ablation surgery). ? Repairing a valve.  Follow these instructions at home:  Wear compression stockings as told by your health care provider. These stockings help to prevent blood clots and reduce swelling in your legs.  Take over-the-counter and prescription medicines only as told by your health care provider.  Stay active by exercising, walking, or doing different activities. Ask your health care provider what  activities are safe for you and how much exercise you need.  Drink enough fluid to keep your urine clear or pale yellow.  Do not use any products that contain nicotine or tobacco, such as cigarettes and e-cigarettes. If you need help quitting, ask your health care provider.  Keep all follow-up visits as told by your health care provider. This is important. Contact a health care provider if:  You have redness, swelling, or more pain in the affected area.  You see a red streak or line that extends up or down from the affected area.  You have skin breakdown or a loss of skin in the affected area, even if the breakdown is small.  You  get an injury in the affected area. Get help right away if:  You get an injury and an open wound in the affected area.  You have severe pain that does not get better with medicine.  You have sudden numbness or weakness in the foot or ankle below the affected area, or you have trouble moving your foot or ankle.  You have a fever and you have worse or persistent symptoms.  You have chest pain.  You have shortness of breath. Summary  Chronic venous insufficiency, also called venous stasis, is a condition that prevents blood from being pumped effectively through the veins in your legs.  Chronic venous insufficiency occurs when the vein walls become stretched, weakened, or damaged, or when valves within the vein are damaged.  Treatment for this condition depends on how severe your condition is, and it may involve wearing compression stockings or having a procedure.  Make sure you stay active by exercising, walking, or doing different activities. Ask your health care provider what activities are safe for you and how much exercise you need. This information is not intended to replace advice given to you by your health care provider. Make sure you discuss any questions you have with your health care provider. Document Released: 06/20/2006 Document Revised: 01/04/2016 Document Reviewed: 01/04/2016 Elsevier Interactive Patient Education  2017 Elsevier Inc.   To measure for knee high compression hose: Measure the length of calf, largest circumference of calf, and ankle circumference first thing in the morning before your legs have a chance to swell.  Take these 3 measurements with you to obtain 20-30 mm mercury graduated knee high compression hose.  Put the stockings on in the morning, remove at bedtime.

## 2016-08-23 NOTE — Addendum Note (Signed)
Addended by: Lianne Cure A on: 08/23/2016 03:59 PM   Modules accepted: Orders

## 2016-12-14 DIAGNOSIS — Z0000 Encounter for general adult medical examination without abnormal findings: Secondary | ICD-10-CM | POA: Diagnosis not present

## 2016-12-14 DIAGNOSIS — I10 Essential (primary) hypertension: Secondary | ICD-10-CM | POA: Diagnosis not present

## 2016-12-14 DIAGNOSIS — E7849 Other hyperlipidemia: Secondary | ICD-10-CM | POA: Diagnosis not present

## 2016-12-21 DIAGNOSIS — Z0000 Encounter for general adult medical examination without abnormal findings: Secondary | ICD-10-CM | POA: Diagnosis not present

## 2016-12-21 DIAGNOSIS — J449 Chronic obstructive pulmonary disease, unspecified: Secondary | ICD-10-CM | POA: Diagnosis not present

## 2016-12-21 DIAGNOSIS — I69954 Hemiplegia and hemiparesis following unspecified cerebrovascular disease affecting left non-dominant side: Secondary | ICD-10-CM | POA: Diagnosis not present

## 2016-12-21 DIAGNOSIS — I129 Hypertensive chronic kidney disease with stage 1 through stage 4 chronic kidney disease, or unspecified chronic kidney disease: Secondary | ICD-10-CM | POA: Diagnosis not present

## 2016-12-21 DIAGNOSIS — N183 Chronic kidney disease, stage 3 (moderate): Secondary | ICD-10-CM | POA: Diagnosis not present

## 2016-12-21 DIAGNOSIS — Z23 Encounter for immunization: Secondary | ICD-10-CM | POA: Diagnosis not present

## 2016-12-21 DIAGNOSIS — D696 Thrombocytopenia, unspecified: Secondary | ICD-10-CM | POA: Diagnosis not present

## 2016-12-21 DIAGNOSIS — I6529 Occlusion and stenosis of unspecified carotid artery: Secondary | ICD-10-CM | POA: Diagnosis not present

## 2016-12-21 DIAGNOSIS — K219 Gastro-esophageal reflux disease without esophagitis: Secondary | ICD-10-CM | POA: Diagnosis not present

## 2016-12-21 DIAGNOSIS — E7849 Other hyperlipidemia: Secondary | ICD-10-CM | POA: Diagnosis not present

## 2016-12-26 DIAGNOSIS — Z1212 Encounter for screening for malignant neoplasm of rectum: Secondary | ICD-10-CM | POA: Diagnosis not present

## 2017-08-07 DIAGNOSIS — J449 Chronic obstructive pulmonary disease, unspecified: Secondary | ICD-10-CM | POA: Diagnosis not present

## 2017-08-07 DIAGNOSIS — Z6825 Body mass index (BMI) 25.0-25.9, adult: Secondary | ICD-10-CM | POA: Diagnosis not present

## 2017-08-07 DIAGNOSIS — R05 Cough: Secondary | ICD-10-CM | POA: Diagnosis not present

## 2017-08-07 DIAGNOSIS — J209 Acute bronchitis, unspecified: Secondary | ICD-10-CM | POA: Diagnosis not present

## 2017-08-24 DIAGNOSIS — J441 Chronic obstructive pulmonary disease with (acute) exacerbation: Secondary | ICD-10-CM | POA: Diagnosis not present

## 2017-08-24 DIAGNOSIS — J209 Acute bronchitis, unspecified: Secondary | ICD-10-CM | POA: Diagnosis not present

## 2017-08-24 DIAGNOSIS — R05 Cough: Secondary | ICD-10-CM | POA: Diagnosis not present

## 2017-09-04 DIAGNOSIS — H524 Presbyopia: Secondary | ICD-10-CM | POA: Diagnosis not present

## 2017-09-12 DIAGNOSIS — H35351 Cystoid macular degeneration, right eye: Secondary | ICD-10-CM | POA: Diagnosis not present

## 2017-09-12 DIAGNOSIS — H2512 Age-related nuclear cataract, left eye: Secondary | ICD-10-CM | POA: Diagnosis not present

## 2017-09-15 DIAGNOSIS — H2513 Age-related nuclear cataract, bilateral: Secondary | ICD-10-CM | POA: Diagnosis not present

## 2017-09-15 DIAGNOSIS — H2512 Age-related nuclear cataract, left eye: Secondary | ICD-10-CM | POA: Diagnosis not present

## 2017-09-15 DIAGNOSIS — H25812 Combined forms of age-related cataract, left eye: Secondary | ICD-10-CM | POA: Diagnosis not present

## 2017-10-09 DIAGNOSIS — Z0100 Encounter for examination of eyes and vision without abnormal findings: Secondary | ICD-10-CM | POA: Diagnosis not present

## 2017-11-27 ENCOUNTER — Encounter (INDEPENDENT_AMBULATORY_CARE_PROVIDER_SITE_OTHER): Payer: Self-pay | Admitting: Orthopedic Surgery

## 2017-11-27 ENCOUNTER — Ambulatory Visit (INDEPENDENT_AMBULATORY_CARE_PROVIDER_SITE_OTHER): Payer: Medicare HMO

## 2017-11-27 ENCOUNTER — Ambulatory Visit (INDEPENDENT_AMBULATORY_CARE_PROVIDER_SITE_OTHER): Payer: Medicare HMO | Admitting: Orthopedic Surgery

## 2017-11-27 DIAGNOSIS — M25522 Pain in left elbow: Secondary | ICD-10-CM

## 2017-11-29 NOTE — Progress Notes (Signed)
Office Visit Note   Patient: Robert Bowers           Date of Birth: May 08, 1934           MRN: [id number] Visit Date: 11/27/2017 Requested by: Marton Redwood, MD [address] [place], [place] [address] PCP: Marton Redwood, MD  Subjective: Chief Complaint  Patient presents with  . Left Elbow - Pain    HPI: Robert Bowers is a patient with left elbow pain.  He twisted his arm about 2 weeks ago.  This occurred when he was lifting an object.  He states that the elbow is still very sore.  He is right-hand dominant.  He is had previous elbow surgery done about 15 years ago.              ROS: All systems reviewed are negative as they relate to the chief complaint within the history of present illness.  Patient denies  fevers or chills.   Assessment & Plan: Visit Diagnoses:  1. Pain in left elbow     Plan: Impression is left elbow pain with good motor or sensory function in the hand.  Radiographs unremarkable.  I think this is likely some type of soft tissue strain.  Biceps and triceps tendon both functional and intact.  I would wait this out another 4 weeks before considering any further intervention.  Follow-Up Instructions: No follow-ups on file.   Orders:  Orders Placed This Encounter  Procedures  . XR Elbow Complete Left (3+View)   No orders of the defined types were placed in this encounter.     Procedures: No procedures performed   Clinical Data: No additional findings.  Objective: Vital Signs: There were no vitals taken for this visit.  Physical Exam:   Constitutional: Patient appears well-developed HEENT:  Head: Normocephalic Eyes:EOM are normal Neck: Normal range of motion Cardiovascular: Normal rate Pulmonary/chest: Effort normal Neurologic: Patient is alert Skin: Skin is warm Psychiatric: Patient has normal mood and affect    Ortho Exam: Ortho exam demonstrates intact EPL FPL interosseous function in that left elbow.  Has about 15 to 20 degrees from full  extension but good has good flexion to about 120.  Joint is located and not unstable to varus and valgus stress.  Radial pulses intact.  No proximal lymphadenopathy is present.  No swelling or bruising noted in the elbow joint.  Specialty Comments:  No specialty comments available.  Imaging: No results found.   PMFS History: Patient Active Problem List   Diagnosis Date Noted  . Aftercare following surgery of the circulatory system, [place] 07/23/2013  . Occlusion and stenosis of carotid artery without mention of cerebral infarction 06/07/2011   Past Medical History:  Diagnosis Date  . Carotid artery occlusion   . Complication of anesthesia    difficulty awakening from anesthesia, & N & V  . COPD (chronic obstructive pulmonary disease) ([place])   . GERD (gastroesophageal reflux disease)   . Hyperlipidemia   . Hypertension   . PONV (postoperative nausea and vomiting)   . Recurrent upper respiratory infection (URI)    last bout of bronchitis 04/2011.  Marland Kitchen Shortness of breath    at times due to COPD.  . Stroke [facility])     Family History  Problem Relation Age of Onset  . Heart disease Mother        Heart Disease before age 17  . Hypertension Mother   . Hyperlipidemia Mother   . Heart attack Mother   .  Varicose Veins Mother   . Kidney disease Father   . Anesthesia problems Neg Hx   . Colon cancer Neg Hx     Past Surgical History:  Procedure Laterality Date  . APPENDECTOMY    . ELBOW SURGERY    . ENDARTERECTOMY  06/29/2011   Procedure: ENDARTERECTOMY CAROTID;  Surgeon: Mal Misty, MD;  Location: [place];  Service: Vascular;  Laterality: Left;  . EYE SURGERY  Aug. and Dec. 2012   Detatched retina and Macular Deg.  Marland Kitchen EYE SURGERY Bilateral Feb. 2015   Eyelid / Eyebrow  . JOINT REPLACEMENT     Elbow-shoulder  . KIDNEY SURGERY    . SHOULDER SURGERY     Social History   Occupational History  . Not on file  Tobacco Use  . Smoking status: Former Smoker    Packs/day: 1.00     Years: 35.00    Pack years: 35.00    Types: Cigarettes    Last attempt to quit: 06/03/1991    Years since quitting: 26.5  . Smokeless tobacco: Never Used  Substance and Sexual Activity  . Alcohol use: Yes    Alcohol/week: 12.0 standard drinks    Types: 12 Cans of beer per week  . Drug use: No  . Sexual activity: Not on file

## 2017-12-20 DIAGNOSIS — E7849 Other hyperlipidemia: Secondary | ICD-10-CM | POA: Diagnosis not present

## 2017-12-20 DIAGNOSIS — R82998 Other abnormal findings in urine: Secondary | ICD-10-CM | POA: Diagnosis not present

## 2017-12-20 DIAGNOSIS — I10 Essential (primary) hypertension: Secondary | ICD-10-CM | POA: Diagnosis not present

## 2017-12-27 DIAGNOSIS — Z0000 Encounter for general adult medical examination without abnormal findings: Secondary | ICD-10-CM | POA: Diagnosis not present

## 2017-12-27 DIAGNOSIS — N183 Chronic kidney disease, stage 3 (moderate): Secondary | ICD-10-CM | POA: Diagnosis not present

## 2017-12-27 DIAGNOSIS — E7849 Other hyperlipidemia: Secondary | ICD-10-CM | POA: Diagnosis not present

## 2017-12-27 DIAGNOSIS — I6529 Occlusion and stenosis of unspecified carotid artery: Secondary | ICD-10-CM | POA: Diagnosis not present

## 2017-12-27 DIAGNOSIS — Z23 Encounter for immunization: Secondary | ICD-10-CM | POA: Diagnosis not present

## 2017-12-27 DIAGNOSIS — I69954 Hemiplegia and hemiparesis following unspecified cerebrovascular disease affecting left non-dominant side: Secondary | ICD-10-CM | POA: Diagnosis not present

## 2017-12-27 DIAGNOSIS — J449 Chronic obstructive pulmonary disease, unspecified: Secondary | ICD-10-CM | POA: Diagnosis not present

## 2017-12-27 DIAGNOSIS — I10 Essential (primary) hypertension: Secondary | ICD-10-CM | POA: Diagnosis not present

## 2017-12-27 DIAGNOSIS — D692 Other nonthrombocytopenic purpura: Secondary | ICD-10-CM | POA: Diagnosis not present

## 2017-12-27 DIAGNOSIS — I129 Hypertensive chronic kidney disease with stage 1 through stage 4 chronic kidney disease, or unspecified chronic kidney disease: Secondary | ICD-10-CM | POA: Diagnosis not present

## 2018-01-02 DIAGNOSIS — Z1212 Encounter for screening for malignant neoplasm of rectum: Secondary | ICD-10-CM | POA: Diagnosis not present

## 2018-01-09 DIAGNOSIS — Z13820 Encounter for screening for osteoporosis: Secondary | ICD-10-CM | POA: Diagnosis not present

## 2018-01-09 DIAGNOSIS — M859 Disorder of bone density and structure, unspecified: Secondary | ICD-10-CM | POA: Diagnosis not present

## 2018-02-15 ENCOUNTER — Ambulatory Visit: Payer: Medicare HMO | Admitting: Family

## 2018-02-15 ENCOUNTER — Encounter (HOSPITAL_COMMUNITY): Payer: Medicare HMO

## 2018-05-15 DIAGNOSIS — I129 Hypertensive chronic kidney disease with stage 1 through stage 4 chronic kidney disease, or unspecified chronic kidney disease: Secondary | ICD-10-CM | POA: Diagnosis not present

## 2018-05-15 DIAGNOSIS — J069 Acute upper respiratory infection, unspecified: Secondary | ICD-10-CM | POA: Diagnosis not present

## 2018-05-15 DIAGNOSIS — N183 Chronic kidney disease, stage 3 (moderate): Secondary | ICD-10-CM | POA: Diagnosis not present

## 2018-05-15 DIAGNOSIS — R05 Cough: Secondary | ICD-10-CM | POA: Diagnosis not present

## 2018-08-03 DIAGNOSIS — D225 Melanocytic nevi of trunk: Secondary | ICD-10-CM | POA: Diagnosis not present

## 2018-08-03 DIAGNOSIS — I872 Venous insufficiency (chronic) (peripheral): Secondary | ICD-10-CM | POA: Diagnosis not present

## 2018-08-03 DIAGNOSIS — L570 Actinic keratosis: Secondary | ICD-10-CM | POA: Diagnosis not present

## 2018-08-03 DIAGNOSIS — X32XXXD Exposure to sunlight, subsequent encounter: Secondary | ICD-10-CM | POA: Diagnosis not present

## 2018-08-07 ENCOUNTER — Telehealth (HOSPITAL_COMMUNITY): Payer: Self-pay | Admitting: Rehabilitation

## 2018-08-07 NOTE — Telephone Encounter (Signed)

## 2018-08-08 ENCOUNTER — Ambulatory Visit (INDEPENDENT_AMBULATORY_CARE_PROVIDER_SITE_OTHER): Payer: Medicare HMO | Admitting: Family

## 2018-08-08 ENCOUNTER — Ambulatory Visit (HOSPITAL_COMMUNITY)
Admission: RE | Admit: 2018-08-08 | Discharge: 2018-08-08 | Disposition: A | Discharge status: Home / Self Care | Payer: Medicare HMO | Source: Ambulatory Visit | Attending: Family | Admitting: Family

## 2018-08-08 ENCOUNTER — Encounter: Payer: Self-pay | Admitting: Family

## 2018-08-08 ENCOUNTER — Other Ambulatory Visit: Payer: Self-pay

## 2018-08-08 VITALS — BP 141/75 | HR 68 | Temp 98.2°F | Ht 72.0 in | Wt 176.1 lb

## 2018-08-08 DIAGNOSIS — Z87891 Personal history of nicotine dependence: Secondary | ICD-10-CM

## 2018-08-08 DIAGNOSIS — Z9889 Other specified postprocedural states: Secondary | ICD-10-CM

## 2018-08-08 DIAGNOSIS — I872 Venous insufficiency (chronic) (peripheral): Secondary | ICD-10-CM

## 2018-08-08 DIAGNOSIS — I6522 Occlusion and stenosis of left carotid artery: Secondary | ICD-10-CM

## 2018-08-08 NOTE — Patient Instructions (Signed)
Stroke Prevention Some medical conditions and lifestyle choices can lead to a higher risk for a stroke. You can help to prevent a stroke by making nutrition, lifestyle, and other changes. What nutrition changes can be made?   Eat healthy foods. ? Choose foods that are high in fiber. These include:  Fresh fruits.  Fresh vegetables.  Whole grains. ? Eat at least 5 or more servings of fruits and vegetables each day. Try to fill half of your plate at each meal with fruits and vegetables. ? Choose lean protein foods. These include:  Lowfat (lean) cuts of meat.  Chicken without skin.  Fish.  Tofu.  Beans.  Nuts. ? Eat low-fat dairy products. ? Avoid foods that:  Are high in salt (sodium).  Have saturated fat.  Have trans fat.  Have cholesterol.  Are processed.  Are premade.  Follow eating guidelines as told by your doctor. These may include: ? Reducing how many calories you eat and drink each day. ? Limiting how much salt you eat or drink each day to 1,500 milligrams (mg). ? Using only healthy fats for cooking. These include:  Olive oil.  Canola oil.  Sunflower oil. ? Counting how many carbohydrates you eat and drink each day. What lifestyle changes can be made?  Try to stay at a healthy weight. Talk to your doctor about what a good weight is for you.  Get at least 30 minutes of moderate physical activity at least 5 days a week. This can include: ? Fast walking. ? Biking. ? Swimming.  Do not use any products that have nicotine or tobacco. This includes cigarettes and e-cigarettes. If you need help quitting, ask your doctor. Avoid being around tobacco smoke in general.  Limit how much alcohol you drink to no more than 1 drink a day for nonpregnant women and 2 drinks a day for men. One drink equals 12 oz of beer, 5 oz of wine, or 1 oz of hard liquor.  Do not use drugs.  Avoid taking birth control pills. Talk to your doctor about the risks of taking birth  control pills if: ? You are over 35 years old. ? You smoke. ? You get migraines. ? You have had a blood clot. What other changes can be made?  Manage your cholesterol. ? It is important to eat a healthy diet. ? If your cholesterol cannot be managed through your diet, you may also need to take medicines. Take medicines as told by your doctor.  Manage your diabetes. ? It is important to eat a healthy diet and to exercise regularly. ? If your blood sugar cannot be managed through diet and exercise, you may need to take medicines. Take medicines as told by your doctor.  Control your high blood pressure (hypertension). ? Try to keep your blood pressure below 130/80. This can help lower your risk of stroke. ? It is important to eat a healthy diet and to exercise regularly. ? If your blood pressure cannot be managed through diet and exercise, you may need to take medicines. Take medicines as told by your doctor. ? Ask your doctor if you should check your blood pressure at home. ? Have your blood pressure checked every year. Do this even if your blood pressure is normal.  Talk to your doctor about getting checked for a sleep disorder. Signs of this can include: ? Snoring a lot. ? Feeling very tired.  Take over-the-counter and prescription medicines only as told by your doctor. These may   include aspirin or blood thinners (antiplatelets or anticoagulants).  Make sure that any other medical conditions you have are managed. Where to find more information  American Stroke Association: www.strokeassociation.org  National Stroke Association: www.stroke.org Get help right away if:  You have any symptoms of stroke. "BE FAST" is an easy way to remember the main warning signs: ? B - Balance. Signs are dizziness, sudden trouble walking, or loss of balance. ? E - Eyes. Signs are trouble seeing or a sudden change in how you see. ? F - Face. Signs are sudden weakness or loss of feeling of the face,  or the face or eyelid drooping on one side. ? A - Arms. Signs are weakness or loss of feeling in an arm. This happens suddenly and usually on one side of the body. ? S - Speech. Signs are sudden trouble speaking, slurred speech, or trouble understanding what people say. ? T - Time. Time to call emergency services. Write down what time symptoms started.  You have other signs of stroke, such as: ? A sudden, very bad headache with no known cause. ? Feeling sick to your stomach (nausea). ? Throwing up (vomiting). ? Jerky movements you cannot control (seizure). These symptoms may represent a serious problem that is an emergency. Do not wait to see if the symptoms will go away. Get medical help right away. Call your local emergency services (911 in the [place] Do not drive yourself to the hospital. Summary  You can prevent a stroke by eating healthy, exercising, not smoking, drinking less alcohol, and treating other health problems, such as diabetes, high blood pressure, or high cholesterol.  Do not use any products that contain nicotine or tobacco, such as cigarettes and e-cigarettes.  Get help right away if you have any signs or symptoms of a stroke. This information is not intended to replace advice given to you by your health care provider. Make sure you discuss any questions you have with your health care provider. Document Released: 08/16/2011 Document Revised: 05/18/2016 Document Reviewed: 05/18/2016 Elsevier Interactive Patient Education  2019 [employer]     Chronic Venous Insufficiency Chronic venous insufficiency, also called venous stasis, is a condition that prevents blood from being pumped effectively through the veins in your legs. Blood may no longer be pumped effectively from the legs back to the heart. This condition can range from mild to severe. With proper treatment, you should be able to continue with an active life. What are the causes? Chronic venous insufficiency  occurs when the vein walls become stretched, weakened, or damaged, or when valves within the vein are damaged. Some common causes of this include:  High blood pressure inside the veins (venous hypertension).  Increased blood pressure in the leg veins from long periods of sitting or standing.  A blood clot that blocks blood flow in a vein (deep vein thrombosis, DVT).  Inflammation of a vein (phlebitis) that causes a blood clot to form.  Tumors in the pelvis that cause blood to back up. What increases the risk? The following factors may make you more likely to develop this condition:  Having a family history of this condition.  Obesity.  Pregnancy.  Living without enough physical activity or exercise (sedentary lifestyle).  Smoking.  Having a job that requires long periods of standing or sitting in one place.  Being a certain age. Women in their 75s and 77s and men in their 36s are more likely to develop this condition. What are the signs  or symptoms? Symptoms of this condition include:  Veins that are enlarged, bulging, or twisted (varicose veins).  Skin breakdown or ulcers.  Reddened or discolored skin on the front of the leg.  Brown, smooth, tight, and painful skin just above the ankle, usually on the inside of the leg (lipodermatosclerosis).  Swelling. How is this diagnosed? This condition may be diagnosed based on:  Your medical history.  A physical exam.  Tests, such as: ? A procedure that creates an image of a blood vessel and nearby organs and provides information about blood flow through the blood vessel (duplex ultrasound). ? A procedure that tests blood flow (plethysmography). ? A procedure to look at the veins using X-ray and dye (venogram). How is this treated? The goals of treatment are to help you return to an active life and to minimize pain or disability. Treatment depends on the severity of your condition, and it may include:  Wearing compression  stockings. These can help relieve symptoms and help prevent your condition from getting worse. However, they do not cure the condition.  Sclerotherapy. This is a procedure involving an injection of a material that "dissolves" damaged veins.  Surgery. This may involve: ? Removing a diseased vein (vein stripping). ? Cutting off blood flow through the vein (laser ablation surgery). ? Repairing a valve. Follow these instructions at home:      Wear compression stockings as told by your health care provider. These stockings help to prevent blood clots and reduce swelling in your legs.  Take over-the-counter and prescription medicines only as told by your health care provider.  Stay active by exercising, walking, or doing different activities. Ask your health care provider what activities are safe for you and how much exercise you need.  Drink enough fluid to keep your urine clear or pale yellow.  Do not use any products that contain nicotine or tobacco, such as cigarettes and e-cigarettes. If you need help quitting, ask your health care provider.  Keep all follow-up visits as told by your health care provider. This is important. Contact a health care provider if:  You have redness, swelling, or more pain in the affected area.  You see a red streak or line that extends up or down from the affected area.  You have skin breakdown or a loss of skin in the affected area, even if the breakdown is small.  You get an injury in the affected area. Get help right away if:  You get an injury and an open wound in the affected area.  You have severe pain that does not get better with medicine.  You have sudden numbness or weakness in the foot or ankle below the affected area, or you have trouble moving your foot or ankle.  You have a fever and you have worse or persistent symptoms.  You have chest pain.  You have shortness of breath. Summary  Chronic venous insufficiency, also called  venous stasis, is a condition that prevents blood from being pumped effectively through the veins in your legs.  Chronic venous insufficiency occurs when the vein walls become stretched, weakened, or damaged, or when valves within the vein are damaged.  Treatment for this condition depends on how severe your condition is, and it may involve wearing compression stockings or having a procedure.  Make sure you stay active by exercising, walking, or doing different activities. Ask your health care provider what activities are safe for you and how much exercise you need. This information is  not intended to replace advice given to you by your health care provider. Make sure you discuss any questions you have with your health care provider. Document Released: 06/20/2006 Document Revised: 01/04/2016 Document Reviewed: 01/04/2016 Elsevier Interactive Patient Education  2019 [employer].

## 2018-08-08 NOTE — Progress Notes (Signed)
Chief Complaint: Follow up Extracranial Carotid Artery Stenosis   History of Present Illness  Robert Bowers is a 84 y.o. male who is s/p left CEA in 2013 by Dr. Kellie Simmering.  He returns today for follow up.  He had a TIA sometime before the 2013 CEA, no TIA or stroke activity since then; this manifested as transient unilateral leg weakness, he does not remember which leg, denies any other neurological symptoms; he no longer has leg weakness.  He denies claudication type symtoms with walking, but c/o pain in right knee only when he climbs stairs but not all the time with this activity.   Pt states his blood pressure at home is 130's/60's, but increases in a medical setting.   He reports mild swelling in his lower legs at the end of the day which resolves with overnight elevation of lower extremities. He states that he has knee high compression hose but has not been wearing them lately.    He was treated for bronchitis recently, took a course of prednisone. After the prednisone was stopped, he states he developed a pruritic lower legs dermatitis. This has mostly resolved.    Diabetic: no Tobacco use: former smoker, quit in 1990, smoked 1 ppd x 35 years  Pt meds include: Statin : yes ASA: yes Other anticoagulants/antiplatelets: no   Past Medical History:  Diagnosis Date  . Carotid artery occlusion   . Complication of anesthesia    difficulty awakening from anesthesia, & N & V  . COPD (chronic obstructive pulmonary disease) ([place])   . GERD (gastroesophageal reflux disease)   . Hyperlipidemia   . Hypertension   . PONV (postoperative nausea and vomiting)   . Recurrent upper respiratory infection (URI)    last bout of bronchitis 04/2011.  Marland Kitchen Shortness of breath    at times due to COPD.  . Stroke [facility])     Social History Social History   Tobacco Use  . Smoking status: Former Smoker    Packs/day: 1.00    Years: 35.00    Pack years: 35.00    Types: Cigarettes    Last  attempt to quit: 06/03/1991    Years since quitting: 27.2  . Smokeless tobacco: Never Used  Substance Use Topics  . Alcohol use: Yes    Alcohol/week: 12.0 standard drinks    Types: 12 Cans of beer per week  . Drug use: No    Family History Family History  Problem Relation Age of Onset  . Heart disease Mother        Heart Disease before age 60  . Hypertension Mother   . Hyperlipidemia Mother   . Heart attack Mother   . Varicose Veins Mother   . Kidney disease Father   . Anesthesia problems Neg Hx   . Colon cancer Neg Hx     Surgical History Past Surgical History:  Procedure Laterality Date  . APPENDECTOMY    . ELBOW SURGERY    . ENDARTERECTOMY  06/29/2011   Procedure: ENDARTERECTOMY CAROTID;  Surgeon: Mal Misty, MD;  Location: [facility];  Service: Vascular;  Laterality: Left;  . EYE SURGERY  Aug. and Dec. 2012   Detatched retina and Macular Deg.  Marland Kitchen EYE SURGERY Bilateral Feb. 2015   Eyelid / Eyebrow  . JOINT REPLACEMENT     Elbow-shoulder  . KIDNEY SURGERY    . SHOULDER SURGERY      Allergies  Allergen Reactions  . Vicodin [Hydrocodone-Acetaminophen] Other (See Comments)  Hallucinations.  . Latex Rash  . Tape Itching and Rash    Paper Tape is okay.    Current Outpatient Medications  Medication Sig Dispense Refill  . amLODipine (NORVASC) 10 MG tablet daily.    Marland Kitchen aspirin 81 MG tablet Take 81 mg by mouth daily.    Marland Kitchen atorvastatin (LIPITOR) 40 MG tablet     . clobetasol cream (TEMOVATE) 0.05 %     . Doxylamine Succinate, Sleep, (UNISOM PO) Take by mouth at bedtime.    . hydrochlorothiazide (HYDRODIURIL) 12.5 MG tablet daily.    Marland Kitchen ibuprofen (ADVIL,MOTRIN) 200 MG tablet Take 200 mg by mouth as needed. For pain    . omeprazole (PRILOSEC) 20 MG capsule Take 20 mg by mouth daily.     No current facility-administered medications for this visit.     Review of Systems : See HPI for pertinent positives and negatives.  Physical Examination  Vitals:   08/08/18 0835  08/08/18 0838  BP: (!) 146/75 (!) 141/75  Pulse: 64 68  Temp: 98.2 F (36.8 C)   TempSrc: Temporal   SpO2: 98%   Weight: 176 lb 1.6 oz (79.9 kg)   Height: 6' (1.829 m)    Body mass index is 23.88 kg/m.  General: WDWN fit appearing elderly male in NAD GAIT: normal Eyes: Pupils are equal HENT: No gross abnormalities.  Pulmonary:  Respirations are non-labored, good air movement in all fields, CTAB, no rales, rhonchi, or wheezing. Cardiac: regular rhythm, no detected murmur.  VASCULAR EXAM Carotid Bruits Right Left   Negative Negative     Abdominal aortic pulse is not palpable. Radial pulses are 2+ palpable and equal.   Lower legs and ankles with no edema at this time                                                                                                                          LE Pulses Right Left       POPLITEAL  not palpable   not palpable       POSTERIOR TIBIAL  not palpable   not palpable        DORSALIS PEDIS      ANTERIOR TIBIAL 3+ palpable  3+ palpable     Gastrointestinal: soft, nontender, BS WNL, no r/g, no palpable masses. Musculoskeletal: no muscle atrophy/wasting. M/S 5/5 throughout, extremities without ischemic changes. Skin: a small patch of hemosiderin deposits and at medial aspect right lower leg, no ulcers, no cellulitis.   Neurologic:  A&O X 3; appropriate affect, sensation is normal; speech is normal, CN 2-12 intact, pain and light touch intact in extremities, motor exam as listed above. Psychiatric: Normal thought content, mood appropriate to clinical situation.    Assessment: Robert Bowers is a 84 y.o. male who is s/p left CEA in 2013. He had a TIA before the 2013 left CEA, has had no subsequent TIA or stroke.  Carotid duplex today demonstrates minimal bilateral ICA stenosis.  Fortunately  he does not have DM and quit smoking in 1990.Marland Kitchen He has a normal BMI, stays active, and takes a statin and ASA regularly.    Mild chronic venous  insufficiency in both lower legs: Elevation of feet when not walking, knee high compression hose, see Patient Instructions.    DATA Carotid Duplex (08-08-18): Right Carotid: Velocities in the right ICA are consistent with a 1-39% stenosis. Left Carotid: Velocities in the left ICA are consistent with a 1-39% stenosis               with mild hyperplasia at the proximal patch. Vertebrals:  Bilateral vertebral arteries demonstrate antegrade flow. Subclavians: Normal flow hemodynamics were seen in bilateral subclavian              arteries. Minimal stenosis in the left ICA, compared to no stenosis on the exam of 08-16-16. Stable stenosis of the right ICA at 1-39%.   Plan: Follow-up in 18 months with Carotid Duplex scan.   I discussed in depth with the patient the nature of atherosclerosis, and emphasized the importance of maximal medical management including strict control of blood pressure, blood glucose, and lipid levels, obtaining regular exercise, and continued cessation of smoking.  The patient is aware that without maximal medical management the underlying atherosclerotic disease process will progress, limiting the benefit of any interventions. The patient was given information about stroke prevention and what symptoms should prompt the patient to seek immediate medical care. Thank you for allowing [place] to participate in this patient's care.  Clemon Chambers, RN, MSN, FNP-C [facility] Office: [phone]  Clinic Physician: Scot Dock  08/08/18 8:40 AM

## 2018-11-16 DIAGNOSIS — S70361A Insect bite (nonvenomous), right thigh, initial encounter: Secondary | ICD-10-CM | POA: Diagnosis not present

## 2018-11-16 DIAGNOSIS — L308 Other specified dermatitis: Secondary | ICD-10-CM | POA: Diagnosis not present

## 2018-12-25 DIAGNOSIS — E7849 Other hyperlipidemia: Secondary | ICD-10-CM | POA: Diagnosis not present

## 2019-01-01 DIAGNOSIS — R82998 Other abnormal findings in urine: Secondary | ICD-10-CM | POA: Diagnosis not present

## 2019-01-01 DIAGNOSIS — L299 Pruritus, unspecified: Secondary | ICD-10-CM | POA: Insufficient documentation

## 2019-01-01 DIAGNOSIS — N183 Chronic kidney disease, stage 3 unspecified: Secondary | ICD-10-CM | POA: Diagnosis not present

## 2019-01-01 DIAGNOSIS — E785 Hyperlipidemia, unspecified: Secondary | ICD-10-CM | POA: Diagnosis not present

## 2019-01-01 DIAGNOSIS — I129 Hypertensive chronic kidney disease with stage 1 through stage 4 chronic kidney disease, or unspecified chronic kidney disease: Secondary | ICD-10-CM | POA: Diagnosis not present

## 2019-01-01 DIAGNOSIS — Z0000 Encounter for general adult medical examination without abnormal findings: Secondary | ICD-10-CM | POA: Diagnosis not present

## 2019-01-01 DIAGNOSIS — Z1331 Encounter for screening for depression: Secondary | ICD-10-CM | POA: Diagnosis not present

## 2019-01-01 DIAGNOSIS — Z1212 Encounter for screening for malignant neoplasm of rectum: Secondary | ICD-10-CM | POA: Diagnosis not present

## 2019-01-01 DIAGNOSIS — N1831 Chronic kidney disease, stage 3a: Secondary | ICD-10-CM | POA: Diagnosis not present

## 2019-01-01 DIAGNOSIS — Z1339 Encounter for screening examination for other mental health and behavioral disorders: Secondary | ICD-10-CM | POA: Diagnosis not present

## 2019-01-01 DIAGNOSIS — D692 Other nonthrombocytopenic purpura: Secondary | ICD-10-CM | POA: Diagnosis not present

## 2019-01-01 DIAGNOSIS — M549 Dorsalgia, unspecified: Secondary | ICD-10-CM | POA: Diagnosis not present

## 2019-01-01 DIAGNOSIS — I6529 Occlusion and stenosis of unspecified carotid artery: Secondary | ICD-10-CM | POA: Diagnosis not present

## 2019-01-01 DIAGNOSIS — I69954 Hemiplegia and hemiparesis following unspecified cerebrovascular disease affecting left non-dominant side: Secondary | ICD-10-CM | POA: Diagnosis not present

## 2019-01-01 DIAGNOSIS — I10 Essential (primary) hypertension: Secondary | ICD-10-CM | POA: Diagnosis not present

## 2019-01-01 DIAGNOSIS — J449 Chronic obstructive pulmonary disease, unspecified: Secondary | ICD-10-CM | POA: Diagnosis not present

## 2019-02-18 DIAGNOSIS — L308 Other specified dermatitis: Secondary | ICD-10-CM | POA: Diagnosis not present

## 2019-03-30 ENCOUNTER — Ambulatory Visit: Payer: Medicare HMO

## 2019-04-04 ENCOUNTER — Ambulatory Visit: Payer: Medicare HMO | Attending: Internal Medicine

## 2019-04-04 DIAGNOSIS — Z23 Encounter for immunization: Secondary | ICD-10-CM | POA: Insufficient documentation

## 2019-04-04 NOTE — Progress Notes (Signed)
   Covid-19 Vaccination Clinic  Name:  Robert Bowers    MRN: [id number] DOB: Dec 28, 1934  04/04/2019  Robert Bowers was observed post Covid-19 immunization for 15 minutes without incidence. He was provided with Vaccine Information Sheet and instruction to access the V-Safe system.   Robert Bowers was instructed to call 911 with any severe reactions post vaccine: Marland Kitchen Difficulty breathing  . Swelling of your face and throat  . A fast heartbeat  . A bad rash all over your body  . Dizziness and weakness    Immunizations Administered    Name Date Dose VIS Date Route   Pfizer COVID-19 Vaccine 04/04/2019  3:09 PM 0.3 mL 02/08/2019 Intramuscular   Manufacturer: [place]   Lot: [id number]   [place]: [id number]

## 2019-04-20 DIAGNOSIS — K143 Hypertrophy of tongue papillae: Secondary | ICD-10-CM | POA: Diagnosis not present

## 2019-04-20 DIAGNOSIS — K1379 Other lesions of oral mucosa: Secondary | ICD-10-CM | POA: Diagnosis not present

## 2019-04-20 DIAGNOSIS — E559 Vitamin D deficiency, unspecified: Secondary | ICD-10-CM | POA: Diagnosis not present

## 2019-04-20 DIAGNOSIS — I10 Essential (primary) hypertension: Secondary | ICD-10-CM | POA: Diagnosis not present

## 2019-04-20 DIAGNOSIS — Z1159 Encounter for screening for other viral diseases: Secondary | ICD-10-CM | POA: Diagnosis not present

## 2019-04-20 DIAGNOSIS — Z79899 Other long term (current) drug therapy: Secondary | ICD-10-CM | POA: Diagnosis not present

## 2019-04-20 DIAGNOSIS — E538 Deficiency of other specified B group vitamins: Secondary | ICD-10-CM | POA: Diagnosis not present

## 2019-04-29 ENCOUNTER — Ambulatory Visit: Payer: Medicare HMO | Attending: Internal Medicine

## 2019-04-29 DIAGNOSIS — Z23 Encounter for immunization: Secondary | ICD-10-CM | POA: Insufficient documentation

## 2019-04-29 NOTE — Progress Notes (Signed)
   Covid-19 Vaccination Clinic  Name:  Robert Bowers    MRN: [id number] DOB: 09/05/1934  04/29/2019  [name] was observed post Covid-19 immunization for 15 minutes without incidence. He was provided with Vaccine Information Sheet and instruction to access the V-Safe system.   [name] was instructed to call 911 with any severe reactions post vaccine: Marland Kitchen Difficulty breathing  . Swelling of your face and throat  . A fast heartbeat  . A bad rash all over your body  . Dizziness and weakness    Immunizations Administered    Name Date Dose VIS Date Route   Pfizer COVID-19 Vaccine 04/29/2019  2:12 PM 0.3 mL 02/08/2019 Intramuscular   Manufacturer: [facility]   Lot: [id number]   [place]: [id number]

## 2019-10-22 DIAGNOSIS — L821 Other seborrheic keratosis: Secondary | ICD-10-CM | POA: Diagnosis not present

## 2019-10-22 DIAGNOSIS — D485 Neoplasm of uncertain behavior of skin: Secondary | ICD-10-CM | POA: Diagnosis not present

## 2019-10-22 DIAGNOSIS — D1801 Hemangioma of skin and subcutaneous tissue: Secondary | ICD-10-CM | POA: Diagnosis not present

## 2019-10-22 DIAGNOSIS — L298 Other pruritus: Secondary | ICD-10-CM | POA: Diagnosis not present

## 2019-10-22 DIAGNOSIS — D229 Melanocytic nevi, unspecified: Secondary | ICD-10-CM | POA: Diagnosis not present

## 2019-10-22 DIAGNOSIS — L819 Disorder of pigmentation, unspecified: Secondary | ICD-10-CM | POA: Diagnosis not present

## 2019-10-22 DIAGNOSIS — L814 Other melanin hyperpigmentation: Secondary | ICD-10-CM | POA: Diagnosis not present

## 2019-10-22 DIAGNOSIS — I8393 Asymptomatic varicose veins of bilateral lower extremities: Secondary | ICD-10-CM | POA: Diagnosis not present

## 2019-10-22 DIAGNOSIS — L259 Unspecified contact dermatitis, unspecified cause: Secondary | ICD-10-CM | POA: Diagnosis not present

## 2019-11-05 DIAGNOSIS — L309 Dermatitis, unspecified: Secondary | ICD-10-CM | POA: Diagnosis not present

## 2019-11-11 DIAGNOSIS — L209 Atopic dermatitis, unspecified: Secondary | ICD-10-CM | POA: Diagnosis not present

## 2019-11-18 DIAGNOSIS — L309 Dermatitis, unspecified: Secondary | ICD-10-CM | POA: Diagnosis not present

## 2019-11-18 DIAGNOSIS — I129 Hypertensive chronic kidney disease with stage 1 through stage 4 chronic kidney disease, or unspecified chronic kidney disease: Secondary | ICD-10-CM | POA: Diagnosis not present

## 2019-11-18 DIAGNOSIS — N1831 Chronic kidney disease, stage 3a: Secondary | ICD-10-CM | POA: Diagnosis not present

## 2019-11-18 DIAGNOSIS — L509 Urticaria, unspecified: Secondary | ICD-10-CM | POA: Diagnosis not present

## 2019-11-18 DIAGNOSIS — L282 Other prurigo: Secondary | ICD-10-CM | POA: Diagnosis not present

## 2019-11-19 ENCOUNTER — Ambulatory Visit: Payer: Medicare HMO | Attending: Internal Medicine

## 2019-11-19 DIAGNOSIS — Z23 Encounter for immunization: Secondary | ICD-10-CM

## 2019-11-19 MED FILL — PFIZER-BIONTECH COVID-19 VA: 30 | 1 days supply | Qty: 0 | Fill #0

## 2019-11-19 NOTE — Progress Notes (Signed)
   Covid-19 Vaccination Clinic  Name:  Robert Bowers    MRN: [id number] DOB: 1935/01/27  11/19/2019  Mr. Robert Bowers was observed post Covid-19 immunization for 15 minutes without incident. He was provided with Vaccine Information Sheet and instruction to access the V-Safe system.  Vaccinated by [facility]  Mr. Robert Bowers was instructed to call 911 with any severe reactions post vaccine: Marland Kitchen Difficulty breathing  . Swelling of face and throat  . A fast heartbeat  . A bad rash all over body  . Dizziness and weakness

## 2019-12-16 DIAGNOSIS — Z79899 Other long term (current) drug therapy: Secondary | ICD-10-CM | POA: Diagnosis not present

## 2019-12-16 DIAGNOSIS — L309 Dermatitis, unspecified: Secondary | ICD-10-CM | POA: Diagnosis not present

## 2019-12-18 DIAGNOSIS — L309 Dermatitis, unspecified: Secondary | ICD-10-CM | POA: Diagnosis not present

## 2019-12-30 DIAGNOSIS — Z125 Encounter for screening for malignant neoplasm of prostate: Secondary | ICD-10-CM | POA: Diagnosis not present

## 2019-12-30 DIAGNOSIS — E785 Hyperlipidemia, unspecified: Secondary | ICD-10-CM | POA: Diagnosis not present

## 2020-01-06 DIAGNOSIS — I69954 Hemiplegia and hemiparesis following unspecified cerebrovascular disease affecting left non-dominant side: Secondary | ICD-10-CM | POA: Diagnosis not present

## 2020-01-06 DIAGNOSIS — R82998 Other abnormal findings in urine: Secondary | ICD-10-CM | POA: Diagnosis not present

## 2020-01-06 DIAGNOSIS — N1831 Chronic kidney disease, stage 3a: Secondary | ICD-10-CM | POA: Diagnosis not present

## 2020-01-06 DIAGNOSIS — N401 Enlarged prostate with lower urinary tract symptoms: Secondary | ICD-10-CM | POA: Diagnosis not present

## 2020-01-06 DIAGNOSIS — I6529 Occlusion and stenosis of unspecified carotid artery: Secondary | ICD-10-CM | POA: Diagnosis not present

## 2020-01-06 DIAGNOSIS — Z1331 Encounter for screening for depression: Secondary | ICD-10-CM | POA: Diagnosis not present

## 2020-01-06 DIAGNOSIS — Z1339 Encounter for screening examination for other mental health and behavioral disorders: Secondary | ICD-10-CM | POA: Diagnosis not present

## 2020-01-06 DIAGNOSIS — J449 Chronic obstructive pulmonary disease, unspecified: Secondary | ICD-10-CM | POA: Diagnosis not present

## 2020-01-06 DIAGNOSIS — Z23 Encounter for immunization: Secondary | ICD-10-CM | POA: Diagnosis not present

## 2020-01-06 DIAGNOSIS — I129 Hypertensive chronic kidney disease with stage 1 through stage 4 chronic kidney disease, or unspecified chronic kidney disease: Secondary | ICD-10-CM | POA: Diagnosis not present

## 2020-01-06 DIAGNOSIS — D692 Other nonthrombocytopenic purpura: Secondary | ICD-10-CM | POA: Diagnosis not present

## 2020-01-06 DIAGNOSIS — E785 Hyperlipidemia, unspecified: Secondary | ICD-10-CM | POA: Diagnosis not present

## 2020-01-06 DIAGNOSIS — I10 Essential (primary) hypertension: Secondary | ICD-10-CM | POA: Diagnosis not present

## 2020-01-06 DIAGNOSIS — Z0000 Encounter for general adult medical examination without abnormal findings: Secondary | ICD-10-CM | POA: Diagnosis not present

## 2020-01-16 DIAGNOSIS — L309 Dermatitis, unspecified: Secondary | ICD-10-CM | POA: Diagnosis not present

## 2020-01-16 DIAGNOSIS — L298 Other pruritus: Secondary | ICD-10-CM | POA: Diagnosis not present

## 2020-02-03 DIAGNOSIS — L309 Dermatitis, unspecified: Secondary | ICD-10-CM | POA: Diagnosis not present

## 2020-02-03 DIAGNOSIS — L298 Other pruritus: Secondary | ICD-10-CM | POA: Diagnosis not present

## 2020-02-03 DIAGNOSIS — L235 Allergic contact dermatitis due to other chemical products: Secondary | ICD-10-CM | POA: Diagnosis not present

## 2020-02-04 ENCOUNTER — Other Ambulatory Visit: Payer: Self-pay

## 2020-02-04 DIAGNOSIS — I6522 Occlusion and stenosis of left carotid artery: Secondary | ICD-10-CM

## 2020-02-04 DIAGNOSIS — Z1212 Encounter for screening for malignant neoplasm of rectum: Secondary | ICD-10-CM | POA: Diagnosis not present

## 2020-02-06 DIAGNOSIS — L235 Allergic contact dermatitis due to other chemical products: Secondary | ICD-10-CM | POA: Diagnosis not present

## 2020-02-11 ENCOUNTER — Other Ambulatory Visit: Payer: Self-pay

## 2020-02-11 ENCOUNTER — Ambulatory Visit: Payer: Medicare HMO | Admitting: Physician Assistant

## 2020-02-11 ENCOUNTER — Encounter: Payer: Self-pay | Admitting: Physician Assistant

## 2020-02-11 ENCOUNTER — Ambulatory Visit (HOSPITAL_COMMUNITY)
Admission: RE | Admit: 2020-02-11 | Discharge: 2020-02-11 | Disposition: A | Discharge status: Home / Self Care | Payer: Medicare HMO | Source: Ambulatory Visit | Attending: Physician Assistant | Admitting: Physician Assistant

## 2020-02-11 VITALS — BP 142/70 | HR 53 | Temp 98.6°F | Resp 20 | Ht 72.0 in | Wt 179.7 lb

## 2020-02-11 DIAGNOSIS — I6522 Occlusion and stenosis of left carotid artery: Secondary | ICD-10-CM | POA: Diagnosis not present

## 2020-02-11 DIAGNOSIS — I6523 Occlusion and stenosis of bilateral carotid arteries: Secondary | ICD-10-CM

## 2020-02-11 DIAGNOSIS — Z9889 Other specified postprocedural states: Secondary | ICD-10-CM | POA: Diagnosis not present

## 2020-02-11 NOTE — Progress Notes (Signed)
Office Note     CC:  follow up Requesting Provider:  Marton Redwood, MD  HPI: Robert Bowers is a 85 y.o. (Dec 11, 1934) male who presents for routine surveillance follow of of carotid stenosis. He is s/p Left CEA in 2013 by Dr. Kellie Simmering for symptomatic stenosis. He has ha no recurrent symptoms since. He denies any amaurosis fugax, monocular blindness, slurred speech, facial drooping, upper or lower extremity weakness or numbness.  He denies any lower extremity claudication symptoms or rest pain. He remains very active but does not do any regimented exercise.  He more recently for past 3 months has been dealing with a itchy red rash to mostly his upper and lower extremities. He has had extensive testing and numerous trials of medications to see if this would resolve as well as skin biopsies and has not had any resolution. He does say that it is much better than it was originally. Otherwise he has no new medical issue since he was last seen  The pt is on a statin for cholesterol management.  The pt is on a daily aspirin.   Other AC: none The pt is on HCTZ and BB for hypertension.   The pt is not diabetic.  Tobacco hx: Former, 1993  Past Medical History:  Diagnosis Date  . Carotid artery occlusion   . Complication of anesthesia    difficulty awakening from anesthesia, & N & V  . COPD (chronic obstructive pulmonary disease) ([place])   . GERD (gastroesophageal reflux disease)   . Hyperlipidemia   . Hypertension   . PONV (postoperative nausea and vomiting)   . Recurrent upper respiratory infection (URI)    last bout of bronchitis 04/2011.  Marland Kitchen Shortness of breath    at times due to COPD.  . Stroke [facility])     Past Surgical History:  Procedure Laterality Date  . APPENDECTOMY    . ELBOW SURGERY    . ENDARTERECTOMY  06/29/2011   Procedure: ENDARTERECTOMY CAROTID;  Surgeon: Mal Misty, MD;  Location: [place];  Service: Vascular;  Laterality: Left;  . EYE SURGERY  Aug. and Dec. 2012   Detatched  retina and Macular Deg.  Marland Kitchen EYE SURGERY Bilateral Feb. 2015   Eyelid / Eyebrow  . JOINT REPLACEMENT     Elbow-shoulder  . KIDNEY SURGERY    . SHOULDER SURGERY      Social History   Socioeconomic History  . Marital status: Married    Spouse name: Not on file  . Number of children: Not on file  . Years of education: Not on file  . Highest education level: Not on file  Occupational History  . Not on file  Tobacco Use  . Smoking status: Former Smoker    Packs/day: 1.00    Years: 35.00    Pack years: 35.00    Types: Cigarettes    Quit date: 06/03/1991    Years since quitting: 28.7  . Smokeless tobacco: Never Used  Vaping Use  . Vaping Use: Never used  Substance and Sexual Activity  . Alcohol use: Yes    Alcohol/week: 12.0 standard drinks    Types: 12 Cans of beer per week  . Drug use: No  . Sexual activity: Not on file  Other Topics Concern  . Not on file  Social History Narrative  . Not on file   Social Determinants of Health   Financial Resource Strain: Not on file  Food Insecurity: Not on file  Transportation Needs: Not on  file  Physical Activity: Not on file  Stress: Not on file  Social Connections: Not on file  Intimate Partner Violence: Not on file    Family History  Problem Relation Age of Onset  . Heart disease Mother        Heart Disease before age 67  . Hypertension Mother   . Hyperlipidemia Mother   . Heart attack Mother   . Varicose Veins Mother   . Kidney disease Father   . Anesthesia problems Neg Hx   . Colon cancer Neg Hx     Current Outpatient Medications  Medication Sig Dispense Refill  . aspirin 81 MG tablet Take 81 mg by mouth daily.    Marland Kitchen atorvastatin (LIPITOR) 40 MG tablet     . hydrochlorothiazide (HYDRODIURIL) 12.5 MG tablet daily.    . nebivolol (BYSTOLIC) 5 MG tablet     . omeprazole (PRILOSEC) 20 MG capsule Take 20 mg by mouth daily.     No current facility-administered medications for this visit.    Allergies  Allergen  Reactions  . Vicodin [Hydrocodone-Acetaminophen] Other (See Comments)    Hallucinations.  . Latex Rash  . Tape Itching and Rash    Paper Tape is okay.     REVIEW OF SYSTEMS:  [X]  denotes positive finding, [ ]  denotes negative finding Cardiac  Comments:  Chest pain or chest pressure:    Shortness of breath upon exertion: X Has COPD  Short of breath when lying flat:    Irregular heart rhythm:        Vascular    Pain in calf, thigh, or hip brought on by ambulation:    Pain in feet at night that wakes you up from your sleep:     Blood clot in your veins:    Leg swelling:         Pulmonary    Oxygen at home:    Productive cough:     Wheezing:         Neurologic    Sudden weakness in arms or legs:     Sudden numbness in arms or legs:     Sudden onset of difficulty speaking or slurred speech:    Temporary loss of vision in one eye:     Problems with dizziness:         Gastrointestinal    Blood in stool:     Vomited blood:         Genitourinary    Burning when urinating:     Blood in urine:        Psychiatric    Major depression:         Hematologic    Bleeding problems:    Problems with blood clotting too easily:        Skin    Rashes or ulcers: X Bilateral upper and lower extremities      Constitutional    Fever or chills:      PHYSICAL EXAMINATION:  Vitals:   02/11/20 1425 02/11/20 1430  BP: (!) 145/73 (!) 142/70  Pulse: (!) 53   Resp: 20   Temp: 98.6 F (37 C)   TempSrc: Temporal   SpO2: 98%   Weight: 179 lb 11.2 oz (81.5 kg)   Height: 6' (1.829 m)     General:  WDWN in NAD; vital signs documented above Gait: Normal HENT: WNL, normocephalic Pulmonary: normal non-labored breathing with some expiratory wheezing Cardiac: regular HR, without  Murmurs without carotid bruit Abdomen: soft, NT,  no masses Skin: with rashes of arms and legs, macular erythematous rash Vascular Exam/Pulses:  Right Left  Radial 2+ (normal) 2+ (normal)  Femoral 2+  (normal) 2+ (normal)  Popliteal 2+ (normal) 2+ (normal)  DP 2+ (normal) 2+ (normal)  PT 2+ (normal) 2+ (normal)   Extremities: without ischemic changes, without Gangrene , without cellulitis; without open wounds;  Musculoskeletal: no muscle wasting or atrophy  Neurologic: A&O X 3;  No focal weakness or paresthesias are detected Psychiatric:  The pt has Normal affect.   Non-Invasive Vascular Imaging:   Right Carotid: Velocities in the right ICA are consistent with a 1-39% stenosis.   Left Carotid: Velocities in the left ICA are consistent with a 1-39% stenosis.   Vertebrals: Bilateral vertebral arteries demonstrate antegrade flow.  Subclavians: Normal flow hemodynamics were seen in bilateral subclavian arteries.    ASSESSMENT/PLAN:: 85 y.o. male here for follow up for carotid stenosis. He is s/p left CEA in 2013. He remains without symptoms. His duplex shows stable bilateral 1-39% ICA stenosis bilaterally. Antegrade flow of the vertebral arteries and normal flow in the subclavian arteries bilaterally - His blood pressure is well maintained -He remains on aspirin and statin - reviewed signs and symptoms of TIA/ stroke and should these occur he knows to go to the ER -He will follow up in 18 months with carotid duplex   Karoline Caldwell, PA-C [facility] [phone]  Clinic MD: Dr. Carlis Abbott

## 2020-03-17 ENCOUNTER — Encounter: Payer: Self-pay | Admitting: Gastroenterology

## 2020-03-24 DIAGNOSIS — N1831 Chronic kidney disease, stage 3a: Secondary | ICD-10-CM | POA: Diagnosis not present

## 2020-03-24 DIAGNOSIS — L282 Other prurigo: Secondary | ICD-10-CM | POA: Diagnosis not present

## 2020-03-24 DIAGNOSIS — I129 Hypertensive chronic kidney disease with stage 1 through stage 4 chronic kidney disease, or unspecified chronic kidney disease: Secondary | ICD-10-CM | POA: Diagnosis not present

## 2020-03-26 ENCOUNTER — Encounter: Payer: Self-pay | Admitting: Gastroenterology

## 2020-03-26 ENCOUNTER — Other Ambulatory Visit: Payer: Self-pay

## 2020-03-26 ENCOUNTER — Ambulatory Visit: Payer: Medicare HMO | Admitting: Gastroenterology

## 2020-03-26 VITALS — BP 160/70 | HR 55 | Ht 66.0 in | Wt 175.0 lb

## 2020-03-26 DIAGNOSIS — R198 Other specified symptoms and signs involving the digestive system and abdomen: Secondary | ICD-10-CM | POA: Diagnosis not present

## 2020-03-26 DIAGNOSIS — K219 Gastro-esophageal reflux disease without esophagitis: Secondary | ICD-10-CM

## 2020-03-26 DIAGNOSIS — R0989 Other specified symptoms and signs involving the circulatory and respiratory systems: Secondary | ICD-10-CM | POA: Insufficient documentation

## 2020-03-26 MED ORDER — FAMOTIDINE 20 MG PO TABS: 20.0000 mg | ORAL_TABLET | Freq: Two times a day (BID) | ORAL | 3 refills | Status: AC

## 2020-03-26 MED ORDER — OMEPRAZOLE 40 MG PO CPDR: 40.0000 mg | DELAYED_RELEASE_CAPSULE | Freq: Every day | ORAL | 3 refills | Status: DC

## 2020-03-26 NOTE — Progress Notes (Signed)
03/26/2020 Robert Bowers [id number] 04/25/1934   HISTORY OF PRESENT ILLNESS: This is an 86 year old male who is a patient of Dr. Blanch Media.  He presents here today with complaints of severe GERD and feeling like there is constantly something in his throat.  He says that he has had several bad episodes of reflux recently, particularly in the evening or nighttime to the point that it makes him vomit.  He is currently only on Pepcid 20 mg twice daily.  PCP told him to begin omeprazole daily yesterday, but he has not yet done that as he wanted to be seen here first.  He denies any dysphagia with food actually getting stuck in his esophagus.  He does have a history of esophageal stricture that was dilated on his last EGD back in September 2011.  Of note, they spent most of the visit telling me about this itchy rash that he has had for the past 5 months and trying to figure out what it is.  One of the dermatologists led them to believe that it could be coming from something "internal" so then his wife was initially requesting an EGD and colonoscopy for evaluation.  His last colonoscopy was in January 2017 at which time he was found to have severe diverticulosis in the right and left colon.  Past Medical History:  Diagnosis Date  . Carotid artery occlusion   . Complication of anesthesia    difficulty awakening from anesthesia, & N & V  . COPD (chronic obstructive pulmonary disease) ([place])   . GERD (gastroesophageal reflux disease)   . Hyperlipidemia   . Hypertension   . PONV (postoperative nausea and vomiting)   . Recurrent upper respiratory infection (URI)    last bout of bronchitis 04/2011.  Marland Kitchen Shortness of breath    at times due to COPD.  . Stroke [facility])    Past Surgical History:  Procedure Laterality Date  . APPENDECTOMY    . ELBOW SURGERY    . ENDARTERECTOMY  06/29/2011   Procedure: ENDARTERECTOMY CAROTID;  Surgeon: Mal Misty, MD;  Location: [place];  Service: Vascular;  Laterality:  Left;  . EYE SURGERY  Aug. and Dec. 2012   Detatched retina and Macular Deg.  Marland Kitchen EYE SURGERY Bilateral Feb. 2015   Eyelid / Eyebrow  . JOINT REPLACEMENT     Elbow-shoulder  . KIDNEY SURGERY    . SHOULDER SURGERY      reports that he quit smoking about 28 years ago. His smoking use included cigarettes. He has a 35.00 pack-year smoking history. He has never used smokeless tobacco. He reports current alcohol use of about 12.0 standard drinks of alcohol per week. He reports that he does not use drugs. family history includes Heart attack in his mother; Heart disease in his mother; Hyperlipidemia in his mother; Hypertension in his mother; Kidney disease in his father; Varicose Veins in his mother. Allergies  Allergen Reactions  . Vicodin [Hydrocodone-Acetaminophen] Other (See Comments)    Hallucinations.  . Latex Rash  . Tape Itching and Rash    Paper Tape is okay.      Outpatient Encounter Medications as of 03/26/2020  Medication Sig  . aspirin 81 MG tablet Take 81 mg by mouth daily.  Marland Kitchen atorvastatin (LIPITOR) 40 MG tablet Take 40 mg by mouth daily.  Marland Kitchen augmented betamethasone dipropionate (DIPROLENE-AF) 0.05 % cream Apply topically 2 (two) times daily.  . clobetasol cream (TEMOVATE) 1.60 % Apply 1 application topically 3 (three)  times daily.  . nebivolol (BYSTOLIC) 5 MG tablet Take 5 mg by mouth daily.  Marland Kitchen omeprazole (PRILOSEC) 40 MG capsule Take 1 capsule (40 mg total) by mouth daily.  Marland Kitchen OVER THE COUNTER MEDICATION Sarna cream as needed  . OVER THE COUNTER MEDICATION Ceaave cream Use daily as needed  . [DISCONTINUED] famotidine (PEPCID) 20 MG tablet Take 20 mg by mouth 2 (two) times daily.  . famotidine (PEPCID) 20 MG tablet Take 1 tablet (20 mg total) by mouth 2 (two) times daily.  . [DISCONTINUED] hydrochlorothiazide (HYDRODIURIL) 12.5 MG tablet daily.  . [DISCONTINUED] omeprazole (PRILOSEC) 20 MG capsule Take 20 mg by mouth daily. (Patient not taking: Reported on 03/26/2020)   No  facility-administered encounter medications on file as of 03/26/2020.     REVIEW OF SYSTEMS  : All other systems reviewed and negative except where noted in the History of Present Illness.   PHYSICAL EXAM: BP (!) 160/70   Pulse (!) 55   Ht 5\' 6"  (1.676 m)   Wt 175 lb (79.4 kg)   BMI 28.25 kg/m  General: Well developed white male in no acute distress Head: Normocephalic and atraumatic Eyes:  Sclerae anicteric, conjunctiva pink. Ears: Normal auditory acuity Lungs: Clear throughout to auscultation; no W/R/R. Heart: Regular rate and rhythm; no M/R/G. Abdomen: Soft, non-distended.  BS present.  Non-tender. Musculoskeletal: Symmetrical with no gross deformities  Skin: No lesions on visible extremities Extremities: No edema  Neurological: Alert oriented x 4, grossly non-focal Psychological:  Alert and cooperative. Normal mood and affect  ASSESSMENT AND PLAN: *Severe GERD and globus sensation.  Currently only on Pepcid 20 mg twice daily.  I am going to max out his acid reflux regimen for now with omeprazole 40 mg twice daily and Pepcid 20 mg at bedtime.  Prescriptions sent to pharmacy.  We will plan for esophagram for now as well.  **Just of note, they spent most of the visit telling me about this itchy rash that he has had for the past 5 months and trying to figure out what it is.  One of the dermatologists led them to believe that it could be coming from something "internal" so then his wife was initially requesting an EGD and colonoscopy for evaluation.  I was able get her to understand and defer those for now.     CC:  Marton Redwood, MD

## 2020-03-26 NOTE — Patient Instructions (Addendum)
If you are age 84 or older, your body mass index should be between 23-30. Your Body mass index is 28.25 kg/m. If this is out of the aforementioned range listed, please consider follow up with your Primary Care Provider.  If you are age 60 or younger, your body mass index should be between 19-25. Your Body mass index is 28.25 kg/m. If this is out of the aformentioned range listed, please consider follow up with your Primary Care Provider.   You have been scheduled for a Barium Esophogram at [facility] (1st floor of the hospital) on 04/03/20 at 10:30am. Please arrive 15 minutes prior to your appointment for registration. Make certain not to have anything to eat or drink 3 hours prior to your test. If you need to reschedule for any reason, please contact radiology at [phone] to do so. __________________________________________________________________ A barium swallow is an examination that concentrates on views of the esophagus. This tends to be a double contrast exam (barium and two liquids which, when combined, create a gas to distend the wall of the oesophagus) or single contrast (non-ionic iodine based). The study is usually tailored to your symptoms so a good history is essential. Attention is paid during the study to the form, structure and configuration of the esophagus, looking for functional disorders (such as aspiration, dysphagia, achalasia, motility and reflux) EXAMINATION You may be asked to change into a gown, depending on the type of swallow being performed. A radiologist and radiographer will perform the procedure. The radiologist will advise you of the type of contrast selected for your procedure and direct you during the exam. You will be asked to stand, sit or lie in several different positions and to hold a small amount of fluid in your mouth before being asked to swallow while the imaging is performed .In some instances you may be asked to swallow barium coated marshmallows  to assess the motility of a solid food bolus. The exam can be recorded as a digital or video fluoroscopy procedure. POST PROCEDURE It will take 1-2 days for the barium to pass through your system. To facilitate this, it is important, unless otherwise directed, to increase your fluids for the next 24-48hrs and to resume your normal diet.  This test typically takes about 30 minutes to perform. __________________________________________________________________________________  We have sent the following medications to your pharmacy for you to pick up at your convenience: Pepcid and Omeprazole  Thank you for choosing me and [facility].  Alonza Bogus, PA-C

## 2020-03-26 NOTE — Progress Notes (Signed)
Assessment and plan noted ?

## 2020-03-30 DIAGNOSIS — L300 Nummular dermatitis: Secondary | ICD-10-CM | POA: Diagnosis not present

## 2020-04-03 ENCOUNTER — Other Ambulatory Visit: Payer: Self-pay

## 2020-04-03 ENCOUNTER — Ambulatory Visit (HOSPITAL_COMMUNITY)
Admission: RE | Admit: 2020-04-03 | Discharge: 2020-04-03 | Disposition: A | Discharge status: Home / Self Care | Payer: Medicare HMO | Source: Ambulatory Visit | Attending: Gastroenterology | Admitting: Gastroenterology

## 2020-04-03 DIAGNOSIS — K2289 Other specified disease of esophagus: Secondary | ICD-10-CM | POA: Diagnosis not present

## 2020-04-03 DIAGNOSIS — R198 Other specified symptoms and signs involving the digestive system and abdomen: Secondary | ICD-10-CM | POA: Insufficient documentation

## 2020-04-03 DIAGNOSIS — K219 Gastro-esophageal reflux disease without esophagitis: Secondary | ICD-10-CM | POA: Diagnosis not present

## 2020-04-03 DIAGNOSIS — R0989 Other specified symptoms and signs involving the circulatory and respiratory systems: Secondary | ICD-10-CM

## 2020-04-14 DIAGNOSIS — L300 Nummular dermatitis: Secondary | ICD-10-CM | POA: Diagnosis not present

## 2020-04-16 DIAGNOSIS — R3916 Straining to void: Secondary | ICD-10-CM | POA: Diagnosis not present

## 2020-04-16 DIAGNOSIS — R3912 Poor urinary stream: Secondary | ICD-10-CM | POA: Diagnosis not present

## 2020-04-16 DIAGNOSIS — N401 Enlarged prostate with lower urinary tract symptoms: Secondary | ICD-10-CM | POA: Diagnosis not present

## 2020-04-16 DIAGNOSIS — R350 Frequency of micturition: Secondary | ICD-10-CM | POA: Diagnosis not present

## 2020-04-16 DIAGNOSIS — R3911 Hesitancy of micturition: Secondary | ICD-10-CM | POA: Diagnosis not present

## 2020-04-16 DIAGNOSIS — R351 Nocturia: Secondary | ICD-10-CM | POA: Diagnosis not present

## 2020-05-04 DIAGNOSIS — L300 Nummular dermatitis: Secondary | ICD-10-CM | POA: Diagnosis not present

## 2020-05-04 DIAGNOSIS — L2089 Other atopic dermatitis: Secondary | ICD-10-CM | POA: Diagnosis not present

## 2020-05-04 DIAGNOSIS — L738 Other specified follicular disorders: Secondary | ICD-10-CM | POA: Diagnosis not present

## 2020-05-06 DIAGNOSIS — L2089 Other atopic dermatitis: Secondary | ICD-10-CM | POA: Diagnosis not present

## 2020-05-08 DIAGNOSIS — L2089 Other atopic dermatitis: Secondary | ICD-10-CM | POA: Diagnosis not present

## 2020-05-11 DIAGNOSIS — L2089 Other atopic dermatitis: Secondary | ICD-10-CM | POA: Diagnosis not present

## 2020-05-13 DIAGNOSIS — L2089 Other atopic dermatitis: Secondary | ICD-10-CM | POA: Diagnosis not present

## 2020-05-15 DIAGNOSIS — L2089 Other atopic dermatitis: Secondary | ICD-10-CM | POA: Diagnosis not present

## 2020-05-18 DIAGNOSIS — L2089 Other atopic dermatitis: Secondary | ICD-10-CM | POA: Diagnosis not present

## 2020-05-20 DIAGNOSIS — L2089 Other atopic dermatitis: Secondary | ICD-10-CM | POA: Diagnosis not present

## 2020-05-22 DIAGNOSIS — L2089 Other atopic dermatitis: Secondary | ICD-10-CM | POA: Diagnosis not present

## 2020-05-25 DIAGNOSIS — L2089 Other atopic dermatitis: Secondary | ICD-10-CM | POA: Diagnosis not present

## 2020-06-02 DIAGNOSIS — R238 Other skin changes: Secondary | ICD-10-CM | POA: Diagnosis not present

## 2020-06-02 DIAGNOSIS — L300 Nummular dermatitis: Secondary | ICD-10-CM | POA: Diagnosis not present

## 2020-06-03 DIAGNOSIS — L2089 Other atopic dermatitis: Secondary | ICD-10-CM | POA: Diagnosis not present

## 2020-06-05 DIAGNOSIS — L2089 Other atopic dermatitis: Secondary | ICD-10-CM | POA: Diagnosis not present

## 2020-06-08 DIAGNOSIS — L2089 Other atopic dermatitis: Secondary | ICD-10-CM | POA: Diagnosis not present

## 2020-06-10 DIAGNOSIS — L2089 Other atopic dermatitis: Secondary | ICD-10-CM | POA: Diagnosis not present

## 2020-06-15 DIAGNOSIS — K4090 Unilateral inguinal hernia, without obstruction or gangrene, not specified as recurrent: Secondary | ICD-10-CM | POA: Diagnosis not present

## 2020-06-15 DIAGNOSIS — I10 Essential (primary) hypertension: Secondary | ICD-10-CM | POA: Diagnosis not present

## 2020-06-15 DIAGNOSIS — L2089 Other atopic dermatitis: Secondary | ICD-10-CM | POA: Diagnosis not present

## 2020-06-17 DIAGNOSIS — L2089 Other atopic dermatitis: Secondary | ICD-10-CM | POA: Diagnosis not present

## 2020-06-19 DIAGNOSIS — L2089 Other atopic dermatitis: Secondary | ICD-10-CM | POA: Diagnosis not present

## 2020-06-22 DIAGNOSIS — L2089 Other atopic dermatitis: Secondary | ICD-10-CM | POA: Diagnosis not present

## 2020-06-24 DIAGNOSIS — L2089 Other atopic dermatitis: Secondary | ICD-10-CM | POA: Diagnosis not present

## 2020-06-25 DIAGNOSIS — L300 Nummular dermatitis: Secondary | ICD-10-CM | POA: Diagnosis not present

## 2020-06-26 DIAGNOSIS — L2089 Other atopic dermatitis: Secondary | ICD-10-CM | POA: Diagnosis not present

## 2020-06-29 DIAGNOSIS — L2089 Other atopic dermatitis: Secondary | ICD-10-CM | POA: Diagnosis not present

## 2020-07-01 DIAGNOSIS — L2089 Other atopic dermatitis: Secondary | ICD-10-CM | POA: Diagnosis not present

## 2020-07-03 DIAGNOSIS — L2089 Other atopic dermatitis: Secondary | ICD-10-CM | POA: Diagnosis not present

## 2020-07-07 DIAGNOSIS — R059 Cough, unspecified: Secondary | ICD-10-CM | POA: Diagnosis not present

## 2020-07-07 DIAGNOSIS — Z79899 Other long term (current) drug therapy: Secondary | ICD-10-CM | POA: Diagnosis not present

## 2020-07-07 DIAGNOSIS — Z91018 Allergy to other foods: Secondary | ICD-10-CM | POA: Diagnosis not present

## 2020-07-07 DIAGNOSIS — R197 Diarrhea, unspecified: Secondary | ICD-10-CM | POA: Diagnosis not present

## 2020-07-07 DIAGNOSIS — E785 Hyperlipidemia, unspecified: Secondary | ICD-10-CM | POA: Diagnosis not present

## 2020-07-07 DIAGNOSIS — R058 Other specified cough: Secondary | ICD-10-CM | POA: Diagnosis not present

## 2020-07-07 DIAGNOSIS — U071 COVID-19: Secondary | ICD-10-CM | POA: Diagnosis not present

## 2020-07-07 DIAGNOSIS — I10 Essential (primary) hypertension: Secondary | ICD-10-CM | POA: Diagnosis not present

## 2020-07-07 DIAGNOSIS — Z7982 Long term (current) use of aspirin: Secondary | ICD-10-CM | POA: Diagnosis not present

## 2020-07-07 DIAGNOSIS — Z91048 Other nonmedicinal substance allergy status: Secondary | ICD-10-CM | POA: Diagnosis not present

## 2020-07-07 DIAGNOSIS — Z885 Allergy status to narcotic agent status: Secondary | ICD-10-CM | POA: Diagnosis not present

## 2020-07-20 ENCOUNTER — Ambulatory Visit: Payer: Self-pay | Admitting: General Surgery

## 2020-07-20 DIAGNOSIS — K4090 Unilateral inguinal hernia, without obstruction or gangrene, not specified as recurrent: Secondary | ICD-10-CM | POA: Diagnosis not present

## 2020-07-20 NOTE — H&P (View-Only) (Signed)
istory of Present Illness The patient is a 86 year old male who presents with an inguinal hernia. Referred by: Dr. Brigitte Pulse Chief Complaint: Right inguinal hernia  Patient is an 86 year old male, with a history of previous CVA, hypertension, COPD, hyperlipidemia comes in with a right inguinal hernia. Patient states he is unsure how long this is been there but he has is having more symptoms over the last 12 months. He states he does notice a bulge in the right inguinal area. He states he is able to reduce is on his own. Patient states he has more pain towards the end of the day.  Patient's had no previous abdominal surgery. He's had no previous signs of incarceration or strangulation.      Past Surgical History Appendectomy  Carotid Artery Surgery  Left. Cataract Surgery  Bilateral. Shoulder Surgery  Left.  Diagnostic Studies History Colonoscopy  1-5 years ago  Allergies Latex Exam Gloves *MEDICAL DEVICES AND SUPPLIES*  Tape 1"X5yd *MEDICAL DEVICES AND SUPPLIES*  Vicodin *ANALGESICS - OPIOID*  Allergies Reconciled   Medication History amLODIPine Besylate (10MG  Tablet, Oral) Active. Atorvastatin Calcium (40MG  Tablet, Oral) Active. Omeprazole (40MG  Capsule DR, Oral) Active. hydrOXYzine HCl (25MG  Tablet, Oral) Active. Famotidine (20MG  Tablet, Oral) Active. Stiolto Respimat (2.5-2.5MCG/ACT [employer], Inhalation) Active. Tamsulosin HCl (0.4MG  Capsule, Oral) Active. Nebivolol HCl (5MG  Tablet, Oral) Active. Medications Reconciled  Family History  Alcohol Abuse  Brother. Heart Disease  Mother. Melanoma  Brother.  Other Problems Gastroesophageal Reflux Disease  Inguinal Hernia     Review of Systems General Present- Weight Loss. Not Present- Appetite Loss, Chills, Fatigue, Fever, Night Sweats and Weight Gain. Skin Present- Rash. Not Present- Change in Wart/Mole, Dryness, Hives, Jaundice, New Lesions, Non-Healing Wounds and Ulcer. HEENT Present-  Wears glasses/contact lenses. Not Present- Earache, Hearing Loss, Hoarseness, Nose Bleed, Oral Ulcers, Ringing in the Ears, Seasonal Allergies, Sinus Pain, Sore Throat, Visual Disturbances and Yellow Eyes. Respiratory Present- Snoring. Not Present- Bloody sputum, Chronic Cough, Difficulty Breathing and Wheezing. Breast Not Present- Breast Mass, Breast Pain, Nipple Discharge and Skin Changes. Cardiovascular Present- Leg Cramps. Not Present- Chest Pain, Difficulty Breathing Lying Down, Palpitations, Rapid Heart Rate, Shortness of Breath and Swelling of Extremities. Gastrointestinal Present- Indigestion. Not Present- Abdominal Pain, Bloating, Bloody Stool, Change in Bowel Habits, Chronic diarrhea, Constipation, Difficulty Swallowing, Excessive gas, Gets full quickly at meals, Hemorrhoids, Nausea, Rectal Pain and Vomiting. Musculoskeletal Present- Back Pain. Not Present- Joint Pain, Joint Stiffness, Muscle Pain, Muscle Weakness and Swelling of Extremities. All other systems negative  Vitals  Weight: 175.38 lb Height: 83in Body Surface Area: 2.23 m Body Mass Index: 17.9 kg/m  Temp.: 98.34F  Pulse: 84 (Regular)  P.OX: 95% (Room air) BP: 152/98(Sitting, Left Arm, Standard)       Physical Exam  The physical exam findings are as follows: Note: Constitutional: No acute distress, conversant, appears stated age  Eyes: Anicteric sclerae, moist conjunctiva, no lid lag  Neck: No thyromegaly, trachea midline, no cervical lymphadenopathy  Lungs: Clear to auscultation biilaterally, normal respiratory effot  Cardiovascular: regular rate & rhythm, no murmurs, no peripheal edema, pedal pulses 2+  GI: Soft, no masses or hepatosplenomegaly, non-tender to palpation  MSK: Normal gait, no clubbing cyanosis, edema  Skin: No rashes, palpation reveals normal skin turgor  Psychiatric: Appropriate judgment and insight, oriented to person, place, and time  Abdomen Inspection Hernias - Right  - Inguinal hernia - Reducible - Right.    Assessment & Plan RIGHT INGUINAL HERNIA (K40.90) Impression: 86 year old male with a history  of right inguinal hernia. Hypertension Hyperlipidemia COPD History of CVA  1. The patient will like to proceed to the operating room for laparoscopic right inguinal hernia repair with mesh.  2. I discussed with the patient the signs and symptoms of incarceration and strangulation and the need to proceed to the ER should they occur.  3. I discussed with the patient the risks and benefits of the procedure to include but not limited to: Infection, bleeding, damage to surrounding structures, possible need for further surgery, possible nerve pain, and possible recurrence. The patient was understanding and wishes to proceed.

## 2020-07-20 NOTE — H&P (Signed)
istory of Present Illness The patient is a 86 year old male who presents with an inguinal hernia. Referred by: Dr. Brigitte Pulse Chief Complaint: Right inguinal hernia  Patient is an 86 year old male, with a history of previous CVA, hypertension, COPD, hyperlipidemia comes in with a right inguinal hernia. Patient states he is unsure how long this is been there but he has is having more symptoms over the last 12 months. He states he does notice a bulge in the right inguinal area. He states he is able to reduce is on his own. Patient states he has more pain towards the end of the day.  Patient's had no previous abdominal surgery. He's had no previous signs of incarceration or strangulation.      Past Surgical History Appendectomy  Carotid Artery Surgery  Left. Cataract Surgery  Bilateral. Shoulder Surgery  Left.  Diagnostic Studies History Colonoscopy  1-5 years ago  Allergies Latex Exam Gloves *MEDICAL DEVICES AND SUPPLIES*  Tape 1"X5yd *MEDICAL DEVICES AND SUPPLIES*  Vicodin *ANALGESICS - OPIOID*  Allergies Reconciled   Medication History amLODIPine Besylate (10MG  Tablet, Oral) Active. Atorvastatin Calcium (40MG  Tablet, Oral) Active. Omeprazole (40MG  Capsule DR, Oral) Active. hydrOXYzine HCl (25MG  Tablet, Oral) Active. Famotidine (20MG  Tablet, Oral) Active. Stiolto Respimat (2.5-2.5MCG/ACT [employer], Inhalation) Active. Tamsulosin HCl (0.4MG  Capsule, Oral) Active. Nebivolol HCl (5MG  Tablet, Oral) Active. Medications Reconciled  Family History  Alcohol Abuse  Brother. Heart Disease  Mother. Melanoma  Brother.  Other Problems Gastroesophageal Reflux Disease  Inguinal Hernia     Review of Systems General Present- Weight Loss. Not Present- Appetite Loss, Chills, Fatigue, Fever, Night Sweats and Weight Gain. Skin Present- Rash. Not Present- Change in Wart/Mole, Dryness, Hives, Jaundice, New Lesions, Non-Healing Wounds and Ulcer. HEENT Present-  Wears glasses/contact lenses. Not Present- Earache, Hearing Loss, Hoarseness, Nose Bleed, Oral Ulcers, Ringing in the Ears, Seasonal Allergies, Sinus Pain, Sore Throat, Visual Disturbances and Yellow Eyes. Respiratory Present- Snoring. Not Present- Bloody sputum, Chronic Cough, Difficulty Breathing and Wheezing. Breast Not Present- Breast Mass, Breast Pain, Nipple Discharge and Skin Changes. Cardiovascular Present- Leg Cramps. Not Present- Chest Pain, Difficulty Breathing Lying Down, Palpitations, Rapid Heart Rate, Shortness of Breath and Swelling of Extremities. Gastrointestinal Present- Indigestion. Not Present- Abdominal Pain, Bloating, Bloody Stool, Change in Bowel Habits, Chronic diarrhea, Constipation, Difficulty Swallowing, Excessive gas, Gets full quickly at meals, Hemorrhoids, Nausea, Rectal Pain and Vomiting. Musculoskeletal Present- Back Pain. Not Present- Joint Pain, Joint Stiffness, Muscle Pain, Muscle Weakness and Swelling of Extremities. All other systems negative  Vitals  Weight: 175.38 lb Height: 83in Body Surface Area: 2.23 m Body Mass Index: 17.9 kg/m  Temp.: 98.67F  Pulse: 84 (Regular)  P.OX: 95% (Room air) BP: 152/98(Sitting, Left Arm, Standard)       Physical Exam  The physical exam findings are as follows: Note: Constitutional: No acute distress, conversant, appears stated age  Eyes: Anicteric sclerae, moist conjunctiva, no lid lag  Neck: No thyromegaly, trachea midline, no cervical lymphadenopathy  Lungs: Clear to auscultation biilaterally, normal respiratory effot  Cardiovascular: regular rate & rhythm, no murmurs, no peripheal edema, pedal pulses 2+  GI: Soft, no masses or hepatosplenomegaly, non-tender to palpation  MSK: Normal gait, no clubbing cyanosis, edema  Skin: No rashes, palpation reveals normal skin turgor  Psychiatric: Appropriate judgment and insight, oriented to person, place, and time  Abdomen Inspection Hernias - Right  - Inguinal hernia - Reducible - Right.    Assessment & Plan RIGHT INGUINAL HERNIA (K40.90) Impression: 86 year old male with a history  of right inguinal hernia. Hypertension Hyperlipidemia COPD History of CVA  1. The patient will like to proceed to the operating room for laparoscopic right inguinal hernia repair with mesh.  2. I discussed with the patient the signs and symptoms of incarceration and strangulation and the need to proceed to the ER should they occur.  3. I discussed with the patient the risks and benefits of the procedure to include but not limited to: Infection, bleeding, damage to surrounding structures, possible need for further surgery, possible nerve pain, and possible recurrence. The patient was understanding and wishes to proceed.

## 2020-07-22 DIAGNOSIS — L2089 Other atopic dermatitis: Secondary | ICD-10-CM | POA: Diagnosis not present

## 2020-07-29 DIAGNOSIS — L2089 Other atopic dermatitis: Secondary | ICD-10-CM | POA: Diagnosis not present

## 2020-07-31 DIAGNOSIS — L2089 Other atopic dermatitis: Secondary | ICD-10-CM | POA: Diagnosis not present

## 2020-08-03 DIAGNOSIS — L2089 Other atopic dermatitis: Secondary | ICD-10-CM | POA: Diagnosis not present

## 2020-08-04 DIAGNOSIS — R3912 Poor urinary stream: Secondary | ICD-10-CM | POA: Diagnosis not present

## 2020-08-04 DIAGNOSIS — N401 Enlarged prostate with lower urinary tract symptoms: Secondary | ICD-10-CM | POA: Diagnosis not present

## 2020-08-04 DIAGNOSIS — R350 Frequency of micturition: Secondary | ICD-10-CM | POA: Diagnosis not present

## 2020-08-04 DIAGNOSIS — R351 Nocturia: Secondary | ICD-10-CM | POA: Diagnosis not present

## 2020-08-05 DIAGNOSIS — L2089 Other atopic dermatitis: Secondary | ICD-10-CM | POA: Diagnosis not present

## 2020-08-06 DIAGNOSIS — L2089 Other atopic dermatitis: Secondary | ICD-10-CM | POA: Diagnosis not present

## 2020-08-07 DIAGNOSIS — L2089 Other atopic dermatitis: Secondary | ICD-10-CM | POA: Diagnosis not present

## 2020-08-10 DIAGNOSIS — L2089 Other atopic dermatitis: Secondary | ICD-10-CM | POA: Diagnosis not present

## 2020-08-11 NOTE — Pre-Procedure Instructions (Signed)
Surgical Instructions    Your procedure is scheduled on Monday, June 20th.  Report to [facility] Main Entrance "A" at 11:00 A.M., then check in with the Admitting office.  Call this number if you have problems the morning of surgery:  [phone]   If you have any questions prior to your surgery date call [phone]: Open Monday-Friday 8am-4pm.    Remember:  Do not eat after midnight the night before your surgery.  You may drink clear liquids until 10:00 AM the morning of your surgery.   Clear liquids allowed are: Water, Non-Citrus Juices (without pulp), Carbonated Beverages, Clear Tea, Black Coffee Only, and Gatorade.  The day of surgery (if you do NOT have diabetes):  Drink ONE (1) Pre-Surgery Clear Ensure by 10:00 AM the morning of surgery   This drink was given to you during your hospital pre-op appointment visit. Nothing else to drink after completing the Pre-Surgery Clear Ensure.         If you have questions, please contact your surgeon's office.     Take these medicines the morning of surgery with A SIP OF WATER: atorvastatin (LIPITOR) famotidine (PEPCID) nebivolol (BYSTOLIC)  omeprazole (PRILOSEC)  tamsulosin (FLOMAX)  Tiotropium Bromide-Olodaterol (STIOLTO RESPIMAT) inhaler  IF NEEDED: acetaminophen (TYLENOL)  hydrOXYzine (ATARAX/VISTARIL)    As of today, STOP taking any Aspirin (unless otherwise instructed by your surgeon) Aleve, Naproxen, Ibuprofen, Motrin, Advil, Goody's, BC's, all herbal medications, fish oil, and all vitamins.            Do not wear jewelry.  Do not wear lotions, powders, colognes, or deodorant. Men may shave face and neck. Do not bring valuables to the hospital.              [facility] is not responsible for any belongings or valuables.  Do NOT Smoke (Tobacco/Vaping) or drink Alcohol 24 hours prior to your procedure. If you use a CPAP at night, you may bring all equipment for your overnight stay.   Contacts, glasses, dentures  or bridgework may not be worn into surgery, please bring cases for these belongings   For patients admitted to the hospital, discharge time will be determined by your treatment team.   Patients discharged the day of surgery will not be allowed to drive home, and someone needs to stay with them for 24 hours.  ONLY 1 SUPPORT PERSON MAY BE PRESENT WHILE YOU ARE IN SURGERY. IF YOU ARE TO BE ADMITTED ONCE YOU ARE IN YOUR ROOM YOU WILL BE ALLOWED TWO (2) VISITORS.  Minor children may have two parents present. Special consideration for safety and communication needs will be reviewed on a case by case basis.  Special instructions:    Oral Hygiene is also important to reduce your risk of infection.  Remember - BRUSH YOUR TEETH THE MORNING OF SURGERY WITH YOUR REGULAR TOOTHPASTE   [facility]- Preparing For Surgery  Before surgery, you can play an important role. Because skin is not sterile, your skin needs to be as free of germs as possible. You can reduce the number of germs on your skin by washing with CHG (chlorahexidine gluconate) Soap before surgery.  CHG is an antiseptic cleaner which kills germs and bonds with the skin to continue killing germs even after washing.     Please do not use if you have an allergy to CHG or antibacterial soaps. If your skin becomes reddened/irritated stop using the CHG.  Do not shave (including legs and underarms) for at least 48  hours prior to first CHG shower. It is OK to shave your face.  Please follow these instructions carefully.     Shower the NIGHT BEFORE SURGERY and the MORNING OF SURGERY with CHG Soap.   If you chose to wash your hair, wash your hair first as usual with your normal shampoo. After you shampoo, rinse your hair and body thoroughly to remove the shampoo.  Then [employer] and genitals (private parts) with your normal soap and rinse thoroughly to remove soap.  After that Use CHG Soap as you would any other liquid soap. You can apply CHG directly  to the skin and wash gently with a scrungie or a clean washcloth.   Apply the CHG Soap to your body ONLY FROM THE NECK DOWN.  Do not use on open wounds or open sores. Avoid contact with your eyes, ears, mouth and genitals (private parts). Wash Face and genitals (private parts)  with your normal soap.   Wash thoroughly, paying special attention to the area where your surgery will be performed.  Thoroughly rinse your body with warm water from the neck down.  DO NOT shower/wash with your normal soap after using and rinsing off the CHG Soap.  Pat yourself dry with a CLEAN TOWEL.  Wear CLEAN PAJAMAS to bed the night before surgery  Place CLEAN SHEETS on your bed the night before your surgery  DO NOT SLEEP WITH PETS.   Day of Surgery:  Take a shower with CHG soap. Wear Clean/Comfortable clothing the morning of surgery Do not apply any deodorants/lotions.   Remember to brush your teeth WITH YOUR REGULAR TOOTHPASTE.   Please read over the following fact sheets that you were given.

## 2020-08-12 ENCOUNTER — Encounter (HOSPITAL_COMMUNITY): Payer: Self-pay

## 2020-08-12 ENCOUNTER — Other Ambulatory Visit: Payer: Self-pay

## 2020-08-12 ENCOUNTER — Encounter (HOSPITAL_COMMUNITY)
Admission: RE | Admit: 2020-08-12 | Discharge: 2020-08-12 | Disposition: A | Discharge status: Home / Self Care | Payer: Medicare HMO | Source: Ambulatory Visit | Attending: General Surgery | Admitting: General Surgery

## 2020-08-12 DIAGNOSIS — Z01818 Encounter for other preprocedural examination: Secondary | ICD-10-CM | POA: Diagnosis not present

## 2020-08-12 DIAGNOSIS — I444 Left anterior fascicular block: Secondary | ICD-10-CM | POA: Diagnosis not present

## 2020-08-12 DIAGNOSIS — L2089 Other atopic dermatitis: Secondary | ICD-10-CM | POA: Diagnosis not present

## 2020-08-12 HISTORY — DX: Unspecified osteoarthritis, unspecified site: M19.90

## 2020-08-12 LAB — BASIC METABOLIC PANEL
Anion gap: 6 (ref 5–15)
BUN: 12 mg/dL (ref 8–23)
CO2: 28 mmol/L (ref 22–32)
Calcium: 9 mg/dL (ref 8.9–10.3)
Chloride: 107 mmol/L (ref 98–111)
Creatinine, Ser: 1.2 mg/dL (ref 0.61–1.24)
GFR, Estimated: >60 mL/min (ref >60)
Glucose, Bld: 92 mg/dL (ref 70–99)
Potassium: 3.8 mmol/L (ref 3.5–5.1)
Sodium: 141 mmol/L (ref 135–145)

## 2020-08-12 LAB — CBC
HCT: 45 % (ref 39.0–52.0)
Hemoglobin: 14.6 g/dL (ref 13.0–17.0)
MCH: 28.8 pg (ref 26.0–34.0)
MCHC: 32.4 g/dL (ref 30.0–36.0)
MCV: 88.8 fL (ref 80.0–100.0)
Platelets: 169 10*3/uL (ref 150–400)
RBC: 5.07 MIL/uL (ref 4.22–5.81)
RDW: 14.3 % (ref 11.5–15.5)
WBC: 6.9 10*3/uL (ref 4.0–10.5)
nRBC: 0 % (ref 0.0–0.2)

## 2020-08-12 NOTE — Progress Notes (Addendum)
PCP - Dr. Marton Redwood Cardiologist -   PPM/ICD - n/a Device Orders - n/a Rep Notified - n/a  Chest x-ray - 07/07/20 [facility] (Care Everywhere) EKG - 08/12/20 Stress Test - denies ECHO - denies Cardiac Cath - denies  Sleep Study - denies CPAP - n/a  Fasting Blood Sugar - n/a  Blood Thinner Instructions: n/a Aspirin Instructions: As of today, STOP taking any Aspirin (unless otherwise instructed by your surgeon)  ERAS Protcol - Yes. Clear liquids until 11 am day of surgery PRE-SURGERY Ensure or G2- Ensure  COVID TEST- Positive COVID test 07/07/20   Anesthesia review: Yes. History- COPD, Stroke, Post op nausea and vomiting, Hx carotid stenosis. EKG review.  Patient denies shortness of breath, fever, cough and chest pain at PAT appointment   All instructions explained to the patient, with a verbal understanding of the material. Patient agrees to go over the instructions while at home for a better understanding. Patient also instructed to self quarantine after being tested for COVID-19. The opportunity to ask questions was provided.

## 2020-08-13 NOTE — Progress Notes (Signed)
Anesthesia Chart Review:  Case: [id number] Date/Time: 08/17/20 1245   Procedure: LAPAROSCOPIC RIGHT INGUINAL HERNIA REPAIR WITH MESH (Right)   Anesthesia type: General   Pre-op diagnosis: RIGHT INGUINAL HERNIA   Location: [place] OR ROOM 02 / [place] OR   Surgeons: Ralene Ok, MD       DISCUSSION: Patient is an 86 year old male scheduled for the above procedure.  History includes former smoker (quit 06/03/91), post-operative N/V, HTN, HLD, CVA, (02/2009), carotid artery disease (s/p left carotid endarterectomy 06/29/11), COPD (occasional dyspnea), GERD, COVID-19 +07/07/20. Reported difficulty awakening from anesthesia.    CBC and BMET normal. Has stable EKG since at least 2013 showing sinus rhythm, LAFB, old septal infarct.  No known CAD. Carotid arteries with 1-39% ICA stenosis by [place] six months ago. He denied shortness of breath, cough, fever, chest pain at PAT RN visit. Anesthesia team to evaluate on the day of surgery.    VS: BP (!) 153/89   Pulse 63   Temp 36.8 C (Oral)   Resp 17   Ht 5\' 6"  (1.676 m)   Wt 79.5 kg   SpO2 97%   BMI 28.28 kg/m    PROVIDERS: Ginger Organ., MD Scarlette Shorts, MD is GI Tinnie Gens, MD was vascular surgeon (retired). Last visit 02/11/20 with Karoline Caldwell, PA-C with US showing 1-39% BICA stenosis. 18 month follow-up recommended.   LABS: Labs reviewed: Acceptable for surgery. (all labs ordered are listed, but only abnormal results are displayed)  Labs Reviewed  BASIC METABOLIC PANEL  CBC    IMAGES: CXR 07/07/20 ([facility]): FINDINGS:  Cardiomediastinal silhouette and pulmonary vascularity are within normal limits. There is no lung consolidation or acute infiltrate identified. No pneumothorax or pleural effusions. Lungs do appear to be mildly hyperinflatedwith mild interstitial prominence indicating underlying emphysema. IMPRESSION:  1. No acute processes identified.   EKG: 08/12/20: Normal sinus rhythm Left anterior fascicular block Septal  infarct , age undetermined Abnormal ECG Confirmed by Buford Dresser [phone]) on 08/12/2020 11:09:05 PM - Overall, EKG appears stable when compared to 05/05/11 tracing from [facility], scanned under Media tab, Correspondence, Enc 06/21/11.)   CV: Carotid US 02/11/20: Summary:  - Right Carotid: Velocities in the right ICA are consistent with a 1-39% stenosis.  - Left Carotid: Velocities in the left ICA are consistent with a 1-39% stenosis.  - Vertebrals:  Bilateral vertebral arteries demonstrate antegrade flow.  - Subclavians: Normal flow hemodynamics were seen in bilateral subclavian arteries.    Past Medical History:  Diagnosis Date   Arthritis    Bilateral Hands   Carotid artery occlusion    Complication of anesthesia    difficulty awakening from anesthesia, & N & V   COPD (chronic obstructive pulmonary disease) (HCC)    GERD (gastroesophageal reflux disease)    Hyperlipidemia    Hypertension    PONV (postoperative nausea and vomiting)    Recurrent upper respiratory infection (URI)    last bout of bronchitis 04/2011.   Shortness of breath    at times due to COPD.   Stroke [facility])     Past Surgical History:  Procedure Laterality Date   APPENDECTOMY     ELBOW SURGERY     ENDARTERECTOMY  06/29/2011   Procedure: ENDARTERECTOMY CAROTID;  Surgeon: Mal Misty, MD;  Location: [place];  Service: Vascular;  Laterality: Left;   EYE SURGERY  Aug. and Dec. 2012   Detatched retina and Macular Deg.   EYE SURGERY Bilateral 03/31/2013   Eyelid /  Eyebrow   KIDNEY SURGERY     SHOULDER SURGERY      MEDICATIONS:  acetaminophen (TYLENOL) 500 MG tablet   aspirin 81 MG tablet   atorvastatin (LIPITOR) 40 MG tablet   calcium carbonate (TUMS - DOSED IN MG ELEMENTAL CALCIUM) 500 MG chewable tablet   clobetasol cream (TEMOVATE) 0.05 %   famotidine (PEPCID) 20 MG tablet   hydrocortisone 2.5 % cream   hydrOXYzine (ATARAX/VISTARIL) 25 MG tablet   nebivolol (BYSTOLIC) 5 MG  tablet   omeprazole (PRILOSEC) 40 MG capsule   Polyethyl Glyc-Propyl Glyc PF (SYSTANE HYDRATION PF) 0.4-0.3 % SOLN   tamsulosin (FLOMAX) 0.4 MG CAPS capsule   Tiotropium Bromide-Olodaterol (STIOLTO RESPIMAT) 2.5-2.5 MCG/ACT AERS   triamcinolone cream (KENALOG) 0.1 %   No current facility-administered medications for this encounter.    Myra Gianotti, PA-C Surgical Short Stay/Anesthesiology [facility] Phone [phone] [facility] Phone [phone] 08/13/2020 1:04 PM

## 2020-08-13 NOTE — Anesthesia Preprocedure Evaluation (Addendum)
Anesthesia Evaluation  Patient identified by MRN, date of birth, ID band Patient awake    Reviewed: Allergy & Precautions, NPO status , Patient's Chart, lab work & pertinent test results  History of Anesthesia Complications (+) PONV and history of anesthetic complications  Airway Mallampati: II  TM Distance: >3 FB Neck ROM: Full    Dental no notable dental hx. (+) Teeth Intact, Dental Advisory Given, Caps   Pulmonary shortness of breath and with exertion, COPD,  COPD inhaler, Recent URI  (COVID-19 diagnosed 07/07/20), former smoker,    Pulmonary exam normal breath sounds clear to auscultation       Cardiovascular hypertension, Pt. on medications and Pt. on home beta blockers Normal cardiovascular exam Rhythm:Regular Rate:Normal     Neuro/Psych Carotid artery occlusion s/p CEA CVA (2011), No Residual Symptoms    GI/Hepatic Neg liver ROS, GERD  Medicated,  Endo/Other  negative endocrine ROS  Renal/GU negative Renal ROS  negative genitourinary   Musculoskeletal  (+) Arthritis , Osteoarthritis,    Abdominal   Peds  Hematology negative hematology ROS (+)   Anesthesia Other Findings   Reproductive/Obstetrics                        Anesthesia Physical Anesthesia Plan  ASA: 3  Anesthesia Plan: General   Post-op Pain Management:    Induction: Intravenous  PONV Risk Score and Plan: 4 or greater and Ondansetron, Dexamethasone, Treatment may vary due to age or medical condition and Midazolam  Airway Management Planned: Oral ETT  Additional Equipment: None  Intra-op Plan:   Post-operative Plan: Extubation in OR  Informed Consent: I have reviewed the patients History and Physical, chart, labs and discussed the procedure including the risks, benefits and alternatives for the proposed anesthesia with the patient or authorized representative who has indicated his/her understanding and acceptance.      Dental advisory given  Plan Discussed with: CRNA and Anesthesiologist  Anesthesia Plan Comments: (PAT note written 08/13/2020 by Myra Gianotti, PA-C. )     Anesthesia Quick Evaluation

## 2020-08-14 DIAGNOSIS — L2089 Other atopic dermatitis: Secondary | ICD-10-CM | POA: Diagnosis not present

## 2020-08-17 ENCOUNTER — Encounter (HOSPITAL_COMMUNITY)
Admission: RE | Disposition: A | Discharge status: Home / Self Care | Payer: Self-pay | Source: Home / Self Care | Attending: General Surgery

## 2020-08-17 ENCOUNTER — Ambulatory Visit (HOSPITAL_COMMUNITY)
Admission: RE | Admit: 2020-08-17 | Discharge: 2020-08-17 | Disposition: A | Discharge status: Home / Self Care | Payer: Medicare HMO | Attending: General Surgery | Admitting: General Surgery

## 2020-08-17 ENCOUNTER — Ambulatory Visit (HOSPITAL_COMMUNITY): Payer: Medicare HMO | Admitting: Anesthesiology

## 2020-08-17 ENCOUNTER — Ambulatory Visit (HOSPITAL_COMMUNITY): Payer: Medicare HMO | Admitting: Vascular Surgery

## 2020-08-17 DIAGNOSIS — Z79899 Other long term (current) drug therapy: Secondary | ICD-10-CM | POA: Insufficient documentation

## 2020-08-17 DIAGNOSIS — K4090 Unilateral inguinal hernia, without obstruction or gangrene, not specified as recurrent: Secondary | ICD-10-CM | POA: Insufficient documentation

## 2020-08-17 DIAGNOSIS — Z885 Allergy status to narcotic agent status: Secondary | ICD-10-CM | POA: Insufficient documentation

## 2020-08-17 DIAGNOSIS — D176 Benign lipomatous neoplasm of spermatic cord: Secondary | ICD-10-CM | POA: Insufficient documentation

## 2020-08-17 DIAGNOSIS — Z91040 Latex allergy status: Secondary | ICD-10-CM | POA: Insufficient documentation

## 2020-08-17 DIAGNOSIS — J449 Chronic obstructive pulmonary disease, unspecified: Secondary | ICD-10-CM | POA: Insufficient documentation

## 2020-08-17 DIAGNOSIS — Z888 Allergy status to other drugs, medicaments and biological substances status: Secondary | ICD-10-CM | POA: Diagnosis not present

## 2020-08-17 DIAGNOSIS — Z8673 Personal history of transient ischemic attack (TIA), and cerebral infarction without residual deficits: Secondary | ICD-10-CM | POA: Diagnosis not present

## 2020-08-17 DIAGNOSIS — I10 Essential (primary) hypertension: Secondary | ICD-10-CM | POA: Insufficient documentation

## 2020-08-17 DIAGNOSIS — E785 Hyperlipidemia, unspecified: Secondary | ICD-10-CM | POA: Diagnosis not present

## 2020-08-17 HISTORY — PX: INGUINAL HERNIA REPAIR: SHX194

## 2020-08-17 HISTORY — PX: INSERTION OF MESH: SHX5868

## 2020-08-17 SURGERY — REPAIR, HERNIA, INGUINAL, LAPAROSCOPIC
Anesthesia: General | Site: Abdomen | Laterality: Right

## 2020-08-17 MED ORDER — CHLORHEXIDINE GLUCONATE 0.12 % MT SOLN
15.0000 mL | Freq: Once | OROMUCOSAL | Status: AC
  Administered 2020-08-17: 15 mL via OROMUCOSAL

## 2020-08-17 MED ORDER — ORAL CARE MOUTH RINSE: 15.0000 mL | Freq: Once | OROMUCOSAL | Status: AC

## 2020-08-17 MED ORDER — DEXAMETHASONE SODIUM PHOSPHATE 10 MG/ML IJ SOLN
INTRAMUSCULAR | Status: DC | PRN
  Administered 2020-08-17: 5 mg via INTRAVENOUS

## 2020-08-17 MED ORDER — BUPIVACAINE HCL 0.25 % IJ SOLN
INTRAMUSCULAR | Status: DC | PRN
  Administered 2020-08-17: 6 mL

## 2020-08-17 MED ORDER — PROPOFOL 10 MG/ML IV BOLUS
INTRAVENOUS | Status: DC | PRN
  Administered 2020-08-17: 130 mg via INTRAVENOUS

## 2020-08-17 MED ORDER — 0.9 % SODIUM CHLORIDE (POUR BTL) OPTIME
TOPICAL | Status: DC | PRN
  Administered 2020-08-17: 1000 mL

## 2020-08-17 MED ORDER — SUGAMMADEX SODIUM 200 MG/2ML IV SOLN
INTRAVENOUS | Status: DC | PRN
  Administered 2020-08-17: 200 mg via INTRAVENOUS

## 2020-08-17 MED ORDER — LIDOCAINE HCL (PF) 2 % IJ SOLN
INTRAMUSCULAR | Status: AC
  Filled 2020-08-17: qty 5

## 2020-08-17 MED ORDER — FENTANYL CITRATE (PF) 100 MCG/2ML IJ SOLN: 25.0000 ug | INTRAMUSCULAR | Status: DC | PRN

## 2020-08-17 MED ORDER — ONDANSETRON HCL 4 MG/2ML IJ SOLN: 4.0000 mg | Freq: Once | INTRAMUSCULAR | Status: DC | PRN

## 2020-08-17 MED ORDER — ROCURONIUM BROMIDE 10 MG/ML (PF) SYRINGE
PREFILLED_SYRINGE | INTRAVENOUS | Status: DC | PRN
  Administered 2020-08-17: 40 mg via INTRAVENOUS

## 2020-08-17 MED ORDER — EPHEDRINE SULFATE-NACL 50-0.9 MG/10ML-% IV SOSY
PREFILLED_SYRINGE | INTRAVENOUS | Status: DC | PRN
  Administered 2020-08-17: 5 mg via INTRAVENOUS
  Administered 2020-08-17: 10 mg via INTRAVENOUS

## 2020-08-17 MED ORDER — ONDANSETRON HCL 4 MG/2ML IJ SOLN
INTRAMUSCULAR | Status: DC | PRN
  Administered 2020-08-17: 4 mg via INTRAVENOUS

## 2020-08-17 MED ORDER — ONDANSETRON HCL 4 MG/2ML IJ SOLN
INTRAMUSCULAR | Status: AC
  Filled 2020-08-17: qty 2

## 2020-08-17 MED ORDER — CEFAZOLIN SODIUM-DEXTROSE 2-4 GM/100ML-% IV SOLN
INTRAVENOUS | Status: AC
  Filled 2020-08-17: qty 100

## 2020-08-17 MED ORDER — OXYCODONE HCL 5 MG/5ML PO SOLN: 5.0000 mg | Freq: Once | ORAL | Status: DC | PRN

## 2020-08-17 MED ORDER — CEFAZOLIN SODIUM-DEXTROSE 2-4 GM/100ML-% IV SOLN
2.0000 g | INTRAVENOUS | Status: AC
  Administered 2020-08-17: 2 g via INTRAVENOUS

## 2020-08-17 MED ORDER — ACETAMINOPHEN 500 MG PO TABS
1000.0000 mg | ORAL_TABLET | ORAL | Status: AC
  Administered 2020-08-17: 1000 mg via ORAL

## 2020-08-17 MED ORDER — BUPIVACAINE HCL (PF) 0.25 % IJ SOLN
INTRAMUSCULAR | Status: AC
  Filled 2020-08-17: qty 30

## 2020-08-17 MED ORDER — PHENYLEPHRINE 40 MCG/ML (10ML) SYRINGE FOR IV PUSH (FOR BLOOD PRESSURE SUPPORT)
PREFILLED_SYRINGE | INTRAVENOUS | Status: AC
  Filled 2020-08-17: qty 20

## 2020-08-17 MED ORDER — FENTANYL CITRATE (PF) 100 MCG/2ML IJ SOLN
INTRAMUSCULAR | Status: DC | PRN
  Administered 2020-08-17 (×2): 50 ug via INTRAVENOUS

## 2020-08-17 MED ORDER — DEXAMETHASONE SODIUM PHOSPHATE 10 MG/ML IJ SOLN
INTRAMUSCULAR | Status: AC
  Filled 2020-08-17: qty 1

## 2020-08-17 MED ORDER — LACTATED RINGERS IV SOLN: INTRAVENOUS | Status: DC

## 2020-08-17 MED ORDER — TRAMADOL HCL 50 MG PO TABS: 50.0000 mg | ORAL_TABLET | Freq: Four times a day (QID) | ORAL | 0 refills | Status: AC | PRN

## 2020-08-17 MED ORDER — FENTANYL CITRATE (PF) 250 MCG/5ML IJ SOLN
INTRAMUSCULAR | Status: AC
  Filled 2020-08-17: qty 5

## 2020-08-17 MED ORDER — CHLORHEXIDINE GLUCONATE 0.12 % MT SOLN
OROMUCOSAL | Status: AC
  Filled 2020-08-17: qty 15

## 2020-08-17 MED ORDER — ACETAMINOPHEN 500 MG PO TABS
ORAL_TABLET | ORAL | Status: AC
  Filled 2020-08-17: qty 2

## 2020-08-17 MED ORDER — LIDOCAINE 2% (20 MG/ML) 5 ML SYRINGE
INTRAMUSCULAR | Status: DC | PRN
  Administered 2020-08-17: 60 mg via INTRAVENOUS

## 2020-08-17 MED ORDER — OXYCODONE HCL 5 MG PO TABS: 5.0000 mg | ORAL_TABLET | Freq: Once | ORAL | Status: DC | PRN

## 2020-08-17 MED ORDER — ENSURE PRE-SURGERY PO LIQD: 296.0000 mL | Freq: Once | ORAL | Status: DC

## 2020-08-17 MED ORDER — MIDAZOLAM HCL 2 MG/2ML IJ SOLN
INTRAMUSCULAR | Status: AC
  Filled 2020-08-17: qty 2

## 2020-08-17 MED ORDER — AMISULPRIDE (ANTIEMETIC) 5 MG/2ML IV SOLN: 10.0000 mg | Freq: Once | INTRAVENOUS | Status: DC | PRN

## 2020-08-17 MED ORDER — ROCURONIUM BROMIDE 10 MG/ML (PF) SYRINGE
PREFILLED_SYRINGE | INTRAVENOUS | Status: AC
  Filled 2020-08-17: qty 10

## 2020-08-17 MED ORDER — CHLORHEXIDINE GLUCONATE CLOTH 2 % EX PADS: 6.0000 | MEDICATED_PAD | Freq: Once | CUTANEOUS | Status: DC

## 2020-08-17 SURGICAL SUPPLY — 37 items
ADH SKN CLS APL DERMABOND .7 (GAUZE/BANDAGES/DRESSINGS) ×2
CANISTER SUCT 3000ML PPV (MISCELLANEOUS) IMPLANT
COVER SURGICAL LIGHT HANDLE (MISCELLANEOUS) ×3 IMPLANT
COVER WAND RF STERILE (DRAPES) ×3 IMPLANT
DERMABOND ADVANCED (GAUZE/BANDAGES/DRESSINGS) ×1
DERMABOND ADVANCED .7 DNX12 (GAUZE/BANDAGES/DRESSINGS) ×2 IMPLANT
ELECT REM PT RETURN 9FT ADLT (ELECTROSURGICAL) ×3
ELECTRODE REM PT RTRN 9FT ADLT (ELECTROSURGICAL) ×2 IMPLANT
GLOVE BIO SURGEON STRL SZ7.5 (GLOVE) ×3 IMPLANT
GOWN STRL REUS W/ TWL LRG LVL3 (GOWN DISPOSABLE) ×4 IMPLANT
GOWN STRL REUS W/ TWL XL LVL3 (GOWN DISPOSABLE) ×2 IMPLANT
GOWN STRL REUS W/TWL LRG LVL3 (GOWN DISPOSABLE) ×6
GOWN STRL REUS W/TWL XL LVL3 (GOWN DISPOSABLE) ×3
KIT BASIN OR (CUSTOM PROCEDURE TRAY) ×3 IMPLANT
KIT TURNOVER KIT B (KITS) ×3 IMPLANT
MESH 3DMAX 5X7 RT XLRG (Mesh General) ×2 IMPLANT
NS IRRIG 1000ML POUR BTL (IV SOLUTION) ×3 IMPLANT
PAD ARMBOARD 7.5X6 YLW CONV (MISCELLANEOUS) ×6 IMPLANT
RELOAD STAPLE 4.0 BLU F/HERNIA (INSTRUMENTS) IMPLANT
RELOAD STAPLE 4.8 BLK F/HERNIA (STAPLE) IMPLANT
RELOAD STAPLE HERNIA 4.0 BLUE (INSTRUMENTS) ×3 IMPLANT
RELOAD STAPLE HERNIA 4.8 BLK (STAPLE) IMPLANT
SCISSORS LAP 5X35 DISP (ENDOMECHANICALS) ×3 IMPLANT
SET IRRIG TUBING LAPAROSCOPIC (IRRIGATION / IRRIGATOR) IMPLANT
SET TUBE SMOKE EVAC HIGH FLOW (TUBING) ×3 IMPLANT
STAPLER HERNIA 12 8.5 360D (INSTRUMENTS) ×2 IMPLANT
SUT MNCRL AB 4-0 PS2 18 (SUTURE) ×3 IMPLANT
SYRINGE TOOMEY DISP (SYRINGE) ×3 IMPLANT
TOWEL GREEN STERILE (TOWEL DISPOSABLE) ×3 IMPLANT
TOWEL GREEN STERILE FF (TOWEL DISPOSABLE) ×3 IMPLANT
TRAY FOLEY W/BAG SLVR 14FR (SET/KITS/TRAYS/PACK) ×3 IMPLANT
TRAY LAPAROSCOPIC MC (CUSTOM PROCEDURE TRAY) ×3 IMPLANT
TROCAR OPTICAL SHORT 5MM (TROCAR) ×3 IMPLANT
TROCAR OPTICAL SLV SHORT 5MM (TROCAR) ×3 IMPLANT
TROCAR XCEL 12X100 BLDLESS (ENDOMECHANICALS) ×3 IMPLANT
WARMER LAPAROSCOPE (MISCELLANEOUS) ×3 IMPLANT
WATER STERILE IRR 1000ML POUR (IV SOLUTION) ×3 IMPLANT

## 2020-08-17 NOTE — Transfer of Care (Signed)
Immediate Anesthesia Transfer of Care Note  Patient: Robert Bowers  Procedure(s) Performed: LAPAROSCOPIC RIGHT INGUINAL HERNIA REPAIR (Right: Abdomen) INSERTION OF MESH (Abdomen)  Patient Location: PACU  Anesthesia Type:General  Level of Consciousness: drowsy  Airway & Oxygen Therapy: Patient Spontanous Breathing and Patient connected to face mask oxygen  Post-op Assessment: Report given to RN and Post -op Vital signs reviewed and stable  Post vital signs: Reviewed and stable  Last Vitals:  Vitals Value Taken Time  BP 137/63 08/17/20 1221  Temp 36.4 C 08/17/20 1221  Pulse 59 08/17/20 1223  Resp 20 08/17/20 1223  SpO2 100 % 08/17/20 1223  Vitals shown include unvalidated device data.  Last Pain:  Vitals:   08/17/20 1035  TempSrc:   PainSc: 0-No pain      Patients Stated Pain Goal: 5 (17/51/02 5852)  Complications: No notable events documented.

## 2020-08-17 NOTE — Anesthesia Postprocedure Evaluation (Signed)
Anesthesia Post Note  Patient: Robert Bowers  Procedure(s) Performed: LAPAROSCOPIC RIGHT INGUINAL HERNIA REPAIR (Right: Abdomen) INSERTION OF MESH (Abdomen)     Patient location during evaluation: PACU Anesthesia Type: General Level of consciousness: awake and alert and oriented Pain management: pain level controlled Vital Signs Assessment: post-procedure vital signs reviewed and stable Respiratory status: spontaneous breathing, nonlabored ventilation and respiratory function stable Cardiovascular status: blood pressure returned to baseline and stable Postop Assessment: no apparent nausea or vomiting Anesthetic complications: no   No notable events documented.  Last Vitals:  Vitals:   08/17/20 1236 08/17/20 1251  BP: 138/68 (!) 143/72  Pulse: (!) 54 (!) 52  Resp: 18 (!) 24  Temp:    SpO2: 98% 96%    Last Pain:  Vitals:   08/17/20 1251  TempSrc:   PainSc: 0-No pain                 Jyll Tomaro A.

## 2020-08-17 NOTE — Interval H&P Note (Signed)
History and Physical Interval Note:  08/17/2020 10:26 AM  Robert Bowers  has presented today for surgery, with the diagnosis of RIGHT INGUINAL HERNIA.  The various methods of treatment have been discussed with the patient and family. After consideration of risks, benefits and other options for treatment, the patient has consented to  Procedure(s): LAPAROSCOPIC RIGHT INGUINAL HERNIA REPAIR WITH MESH (Right) as a surgical intervention.  The patient's history has been reviewed, patient examined, no change in status, stable for surgery.  I have reviewed the patient's chart and labs.  Questions were answered to the patient's satisfaction.     Ralene Ok

## 2020-08-17 NOTE — Anesthesia Procedure Notes (Signed)
Procedure Name: Intubation Date/Time: 08/17/2020 11:32 AM Performed by: Donnelly Angelica, RN Pre-anesthesia Checklist: Patient identified, Emergency Drugs available, Suction available and Patient being monitored Patient Re-evaluated:Patient Re-evaluated prior to induction Oxygen Delivery Method: Circle System Utilized Preoxygenation: Pre-oxygenation with 100% oxygen Induction Type: IV induction Ventilation: Mask ventilation without difficulty Laryngoscope Size: Mac and 4 Tube type: Oral Tube size: 7.5 mm Number of attempts: 1 Airway Equipment and Method: Stylet and Oral airway Placement Confirmation: ETT inserted through vocal cords under direct vision, positive ETCO2 and breath sounds checked- equal and bilateral Secured at: 23 cm Tube secured with: Tape Dental Injury: Teeth and Oropharynx as per pre-operative assessment

## 2020-08-17 NOTE — Op Note (Signed)
08/17/2020  12:07 PM  PATIENT:  Robert Bowers  86 y.o. male  PRE-OPERATIVE DIAGNOSIS:  RIGHT INGUINAL HERNIA  POST-OPERATIVE DIAGNOSIS:  RIGHT INGUINAL HERNIA  PROCEDURE:  Procedure(s): LAPAROSCOPIC RIGHT INGUINAL HERNIA REPAIR (Right) INSERTION OF MESH  SURGEON:  Surgeon(s) and Role:    * Ralene Ok, MD - Primary  ANESTHESIA:   local and general  EBL:  minimal   BLOOD ADMINISTERED:none  DRAINS: none   LOCAL MEDICATIONS USED:  BUPIVICAINE   SPECIMEN:  No Specimen  DISPOSITION OF SPECIMEN:  N/A  COUNTS:  YES  TOURNIQUET:  * No tourniquets in log *  DICTATION: .Dragon Dictation Counts: reported as correct x 2  Findings:  The patient had a small right direct hernia and cord lipoma  Indications for procedure:  The patient is a 86 year old male with a right hernia for several months. Patient complained of symptomatology to his right inguinal area. The patient was taken back for elective inguinal hernia repair.  Details of the procedure: The patient was taken back to the operating room. The patient was placed in supine position with bilateral SCDs in place.  The patient was prepped and draped in the usual sterile fashion.  After appropriate anitbiotics were confirmed, a time-out was confirmed and all facts were verified.  0.25% Marcaine was used to infiltrate the umbilical area. A 11-blade was used to cut down the skin and blunt dissection was used to get the anterior fashion.  The anterior fascia was incised approximately 1 cm and the muscles were retracted laterally. Blunt dissection was then used to create a space in the preperitoneal area. At this time a 10 mm camera was then introduced into the space and advanced the pubic tubercle and a 12 mm trocar was placed over this and insufflation was started.  At this time and space was created from medial to laterally the preperitoneal space.  Cooper's ligament was initially cleaned off.  The hernia was identified in the  direct space. Dissection of the hernia sac was undertaken and it was fully reduced.  The transversalis fascia retracted spontaneously.    The spermatic cord and the vas deferens was identified and protected in all parts of the case. Cremasterics were dissected off from the cord.    The peritoneum was taken down to approximately the umbilicus a Bard 3D Max mesh, size: Rachelle Hora, was  introduced into the preperitoneal space.  The mesh was brought over to cover the direct and indirect hernia spaces.  This was anchored into place and secured to Cooper's ligament with 4.32mm staples from a [place] hernia stapler. It was anchored to the anterior abdominal wall with 4.8 mm staples. The peritoneum was seen lying posterior to the mesh. There was no staples placed laterally. The insufflation was evacuated and the peritoneum was seen posterior to the mesh. The trochars were removed. The anterior fascia was reapproximated using #1 Vicryl on a UR- 6.  Intra-abdominal air was evacuated and the Veress needle removed. The skin was reapproximated using 4-0 Monocryl subcuticular fashion.  The skin was dressed with Dermabond.  The patient was awakened from general anesthesia and taken to recovery in stable condition.   PLAN OF CARE: Discharge to home after PACU  PATIENT DISPOSITION:  PACU - hemodynamically stable.   Delay start of Pharmacological VTE agent (>24hrs) due to surgical blood loss or risk of bleeding: not applicable

## 2020-08-17 NOTE — Discharge Instructions (Addendum)
[facility] _______[facility] ? ?INGUINAL HERNIA REPAIR: POST OP INSTRUCTIONS ? ?Always review your discharge instruction sheet given to you by the facility where your surgery was performed. ?IF YOU HAVE DISABILITY OR FAMILY LEAVE FORMS, YOU MUST BRING THEM TO THE OFFICE FOR PROCESSING.   ?DO NOT GIVE THEM TO YOUR DOCTOR. ? ?1. A  prescription for pain medication may be given to you upon discharge.  Take your pain medication as prescribed, if needed.  If narcotic pain medicine is not needed, then you may take acetaminophen (Tylenol) or ibuprofen (Advil) as needed. ?2. Take your usually prescribed medications unless otherwise directed. ?If you need a refill on your pain medication, please contact your pharmacy.  They will contact our office to request authorization. Prescriptions will not be filled after 5 pm or on week-ends. ?3. You should follow a light diet the first 24 hours after arrival home, such as soup and crackers, etc.  Be sure to include lots of fluids daily.  Resume your normal diet the day after surgery. ?4.Most patients will experience some swelling and bruising around the umbilicus or in the groin and scrotum.  Ice packs and reclining will help.  Swelling and bruising can take several days to resolve.  ?6. It is common to experience some constipation if taking pain medication after surgery.  Increasing fluid intake and taking a stool softener (such as Colace) will usually help or prevent this problem from occurring.  A mild laxative (Milk of Magnesia or Miralax) should be taken according to package directions if there are no bowel movements after 48 hours. ?7. Unless discharge instructions indicate otherwise, you may remove your bandages 24-48 hours after surgery, and you may shower at that time.  You may have steri-strips (small skin tapes) in place directly over the incision.  These strips should be left on the skin for 7-10 days.  If your surgeon used skin glue on the incision, you may  shower in 24 hours.  The glue will flake off over the next 2-3 weeks.  Any sutures or staples will be removed at the office during your follow-up visit. ?8. ACTIVITIES:  You may resume regular (light) daily activities beginning the next day--such as daily self-care, walking, climbing stairs--gradually increasing activities as tolerated.  You may have sexual intercourse when it is comfortable.  Refrain from any heavy lifting or straining until approved by your doctor. ? ?a.You may drive when you are no longer taking prescription pain medication, you can comfortably wear a seatbelt, and you can safely maneuver your car and apply brakes. ?b.RETURN TO WORK:   ?_____________________________________________ ? ?9.You should see your doctor in the office for a follow-up appointment approximately 2-3 weeks after your surgery.  Make sure that you call for this appointment within a day or two after you arrive home to insure a convenient appointment time. ?10.OTHER INSTRUCTIONS: _________________________ ?   _____________________________________ ? ?WHEN TO CALL YOUR DOCTOR: ?Fever over 101.0 ?Inability to urinate ?Nausea and/or vomiting ?Extreme swelling or bruising ?Continued bleeding from incision. ?Increased pain, redness, or drainage from the incision ? ?The clinic staff is available to answer your questions during regular business hours.  Please don?t hesitate to call and ask to speak to one of the nurses for clinical concerns.  If you have a medical emergency, go to the nearest emergency room or call 911.  A surgeon from [facility] is always on call at the hospital ? ? ?[address], [address], [place], [place]    [address] ?  [address], [place], [place]   [address] [phone] ? [phone] ? FAX [phone] Web site: [email or web address]  May have Tylenol after 5pm tonight, if needed.   Post Anesthesia Home Care Instructions  Activity: Get plenty of rest for the remainder  of the day. A responsible individual must stay with you for 24 hours following the procedure.  For the next 24 hours, DO NOT: -Drive a car -Paediatric nurse -Drink alcoholic beverages -Take any medication unless instructed by your physician -Make any legal decisions or sign important papers.  Meals: Start with liquid foods such as gelatin or soup. Progress to regular foods as tolerated. Avoid greasy, spicy, heavy foods. If nausea and/or vomiting occur, drink only clear liquids until the nausea and/or vomiting subsides. Call your physician if vomiting continues.  Special Instructions/Symptoms: Your throat may feel dry or sore from the anesthesia or the breathing tube placed in your throat during surgery. If this causes discomfort, gargle with warm salt water. The discomfort should disappear within 24 hours.  If you had a scopolamine patch placed behind your ear for the management of post- operative nausea and/or vomiting:  1. The medication in the patch is effective for 72 hours, after which it should be removed.  Wrap patch in a tissue and discard in the trash. Wash hands thoroughly with soap and water. 2. You may remove the patch earlier than 72 hours if you experience unpleasant side effects which may include dry mouth, dizziness or visual disturbances. 3. Avoid touching the patch. Wash your hands with soap and water after contact with the patch.

## 2020-08-18 ENCOUNTER — Encounter (HOSPITAL_COMMUNITY): Payer: Self-pay | Admitting: General Surgery

## 2020-08-19 DIAGNOSIS — L2089 Other atopic dermatitis: Secondary | ICD-10-CM | POA: Diagnosis not present

## 2020-08-21 DIAGNOSIS — L2089 Other atopic dermatitis: Secondary | ICD-10-CM | POA: Diagnosis not present

## 2020-08-24 DIAGNOSIS — L2089 Other atopic dermatitis: Secondary | ICD-10-CM | POA: Diagnosis not present

## 2020-08-26 DIAGNOSIS — L2089 Other atopic dermatitis: Secondary | ICD-10-CM | POA: Diagnosis not present

## 2020-08-27 DIAGNOSIS — N1831 Chronic kidney disease, stage 3a: Secondary | ICD-10-CM | POA: Diagnosis not present

## 2020-08-27 DIAGNOSIS — E785 Hyperlipidemia, unspecified: Secondary | ICD-10-CM | POA: Diagnosis not present

## 2020-08-27 DIAGNOSIS — I129 Hypertensive chronic kidney disease with stage 1 through stage 4 chronic kidney disease, or unspecified chronic kidney disease: Secondary | ICD-10-CM | POA: Diagnosis not present

## 2020-08-28 DIAGNOSIS — L2089 Other atopic dermatitis: Secondary | ICD-10-CM | POA: Diagnosis not present

## 2020-09-14 DIAGNOSIS — L2089 Other atopic dermatitis: Secondary | ICD-10-CM | POA: Diagnosis not present

## 2020-09-16 DIAGNOSIS — L2089 Other atopic dermatitis: Secondary | ICD-10-CM | POA: Diagnosis not present

## 2020-09-17 DIAGNOSIS — L821 Other seborrheic keratosis: Secondary | ICD-10-CM | POA: Diagnosis not present

## 2020-09-17 DIAGNOSIS — L2089 Other atopic dermatitis: Secondary | ICD-10-CM | POA: Diagnosis not present

## 2020-09-17 DIAGNOSIS — L820 Inflamed seborrheic keratosis: Secondary | ICD-10-CM | POA: Diagnosis not present

## 2020-09-18 DIAGNOSIS — L2089 Other atopic dermatitis: Secondary | ICD-10-CM | POA: Diagnosis not present

## 2020-09-21 DIAGNOSIS — L2089 Other atopic dermatitis: Secondary | ICD-10-CM | POA: Diagnosis not present

## 2020-09-23 DIAGNOSIS — L2089 Other atopic dermatitis: Secondary | ICD-10-CM | POA: Diagnosis not present

## 2020-09-25 DIAGNOSIS — L2089 Other atopic dermatitis: Secondary | ICD-10-CM | POA: Diagnosis not present

## 2020-09-28 DIAGNOSIS — L2089 Other atopic dermatitis: Secondary | ICD-10-CM | POA: Diagnosis not present

## 2020-09-30 DIAGNOSIS — L2089 Other atopic dermatitis: Secondary | ICD-10-CM | POA: Diagnosis not present

## 2020-10-02 DIAGNOSIS — L2089 Other atopic dermatitis: Secondary | ICD-10-CM | POA: Diagnosis not present

## 2020-10-05 DIAGNOSIS — L2089 Other atopic dermatitis: Secondary | ICD-10-CM | POA: Diagnosis not present

## 2020-10-07 DIAGNOSIS — L2089 Other atopic dermatitis: Secondary | ICD-10-CM | POA: Diagnosis not present

## 2020-10-09 DIAGNOSIS — L2089 Other atopic dermatitis: Secondary | ICD-10-CM | POA: Diagnosis not present

## 2020-10-12 DIAGNOSIS — L2089 Other atopic dermatitis: Secondary | ICD-10-CM | POA: Diagnosis not present

## 2020-10-14 DIAGNOSIS — L2089 Other atopic dermatitis: Secondary | ICD-10-CM | POA: Diagnosis not present

## 2020-10-16 DIAGNOSIS — L2089 Other atopic dermatitis: Secondary | ICD-10-CM | POA: Diagnosis not present

## 2020-10-19 DIAGNOSIS — L2089 Other atopic dermatitis: Secondary | ICD-10-CM | POA: Diagnosis not present

## 2020-10-21 DIAGNOSIS — L2089 Other atopic dermatitis: Secondary | ICD-10-CM | POA: Diagnosis not present

## 2020-10-23 DIAGNOSIS — L2089 Other atopic dermatitis: Secondary | ICD-10-CM | POA: Diagnosis not present

## 2020-10-26 DIAGNOSIS — L2089 Other atopic dermatitis: Secondary | ICD-10-CM | POA: Diagnosis not present

## 2020-10-28 DIAGNOSIS — L2089 Other atopic dermatitis: Secondary | ICD-10-CM | POA: Diagnosis not present

## 2020-10-30 DIAGNOSIS — L2089 Other atopic dermatitis: Secondary | ICD-10-CM | POA: Diagnosis not present

## 2020-11-04 DIAGNOSIS — L2089 Other atopic dermatitis: Secondary | ICD-10-CM | POA: Diagnosis not present

## 2020-11-06 DIAGNOSIS — L2089 Other atopic dermatitis: Secondary | ICD-10-CM | POA: Diagnosis not present

## 2020-11-09 DIAGNOSIS — L2089 Other atopic dermatitis: Secondary | ICD-10-CM | POA: Diagnosis not present

## 2020-11-11 DIAGNOSIS — L2089 Other atopic dermatitis: Secondary | ICD-10-CM | POA: Diagnosis not present

## 2020-11-13 DIAGNOSIS — L2089 Other atopic dermatitis: Secondary | ICD-10-CM | POA: Diagnosis not present

## 2020-11-16 ENCOUNTER — Other Ambulatory Visit: Payer: Self-pay | Admitting: Gastroenterology

## 2020-11-16 DIAGNOSIS — L2089 Other atopic dermatitis: Secondary | ICD-10-CM | POA: Diagnosis not present

## 2020-11-18 DIAGNOSIS — L2089 Other atopic dermatitis: Secondary | ICD-10-CM | POA: Diagnosis not present

## 2020-11-20 DIAGNOSIS — L2089 Other atopic dermatitis: Secondary | ICD-10-CM | POA: Diagnosis not present

## 2020-11-25 DIAGNOSIS — L2089 Other atopic dermatitis: Secondary | ICD-10-CM | POA: Diagnosis not present

## 2020-11-27 DIAGNOSIS — L2089 Other atopic dermatitis: Secondary | ICD-10-CM | POA: Diagnosis not present

## 2020-11-30 DIAGNOSIS — L2089 Other atopic dermatitis: Secondary | ICD-10-CM | POA: Diagnosis not present

## 2020-12-02 DIAGNOSIS — L2089 Other atopic dermatitis: Secondary | ICD-10-CM | POA: Diagnosis not present

## 2020-12-07 DIAGNOSIS — L2089 Other atopic dermatitis: Secondary | ICD-10-CM | POA: Diagnosis not present

## 2020-12-09 DIAGNOSIS — L2089 Other atopic dermatitis: Secondary | ICD-10-CM | POA: Diagnosis not present

## 2020-12-14 DIAGNOSIS — L2089 Other atopic dermatitis: Secondary | ICD-10-CM | POA: Diagnosis not present

## 2020-12-16 DIAGNOSIS — L2089 Other atopic dermatitis: Secondary | ICD-10-CM | POA: Diagnosis not present

## 2020-12-19 DIAGNOSIS — Z23 Encounter for immunization: Secondary | ICD-10-CM | POA: Diagnosis not present

## 2020-12-20 DIAGNOSIS — H2513 Age-related nuclear cataract, bilateral: Secondary | ICD-10-CM | POA: Diagnosis not present

## 2020-12-20 DIAGNOSIS — H35371 Puckering of macula, right eye: Secondary | ICD-10-CM | POA: Diagnosis not present

## 2020-12-21 DIAGNOSIS — L2089 Other atopic dermatitis: Secondary | ICD-10-CM | POA: Diagnosis not present

## 2020-12-23 DIAGNOSIS — L2089 Other atopic dermatitis: Secondary | ICD-10-CM | POA: Diagnosis not present

## 2020-12-28 DIAGNOSIS — L2089 Other atopic dermatitis: Secondary | ICD-10-CM | POA: Diagnosis not present

## 2020-12-30 DIAGNOSIS — L2089 Other atopic dermatitis: Secondary | ICD-10-CM | POA: Diagnosis not present

## 2021-01-01 DIAGNOSIS — Z0100 Encounter for examination of eyes and vision without abnormal findings: Secondary | ICD-10-CM | POA: Diagnosis not present

## 2021-01-04 DIAGNOSIS — L2089 Other atopic dermatitis: Secondary | ICD-10-CM | POA: Diagnosis not present

## 2021-01-06 DIAGNOSIS — L2089 Other atopic dermatitis: Secondary | ICD-10-CM | POA: Diagnosis not present

## 2021-01-11 DIAGNOSIS — L2089 Other atopic dermatitis: Secondary | ICD-10-CM | POA: Diagnosis not present

## 2021-01-13 DIAGNOSIS — L2089 Other atopic dermatitis: Secondary | ICD-10-CM | POA: Diagnosis not present

## 2021-01-18 DIAGNOSIS — L2089 Other atopic dermatitis: Secondary | ICD-10-CM | POA: Diagnosis not present

## 2021-01-20 DIAGNOSIS — M8589 Other specified disorders of bone density and structure, multiple sites: Secondary | ICD-10-CM | POA: Diagnosis not present

## 2021-01-20 DIAGNOSIS — L2089 Other atopic dermatitis: Secondary | ICD-10-CM | POA: Diagnosis not present

## 2021-01-25 DIAGNOSIS — L2089 Other atopic dermatitis: Secondary | ICD-10-CM | POA: Diagnosis not present

## 2021-01-26 DIAGNOSIS — L2089 Other atopic dermatitis: Secondary | ICD-10-CM | POA: Diagnosis not present

## 2021-01-27 DIAGNOSIS — Z125 Encounter for screening for malignant neoplasm of prostate: Secondary | ICD-10-CM | POA: Diagnosis not present

## 2021-01-27 DIAGNOSIS — M859 Disorder of bone density and structure, unspecified: Secondary | ICD-10-CM | POA: Diagnosis not present

## 2021-01-27 DIAGNOSIS — I10 Essential (primary) hypertension: Secondary | ICD-10-CM | POA: Diagnosis not present

## 2021-01-27 DIAGNOSIS — E785 Hyperlipidemia, unspecified: Secondary | ICD-10-CM | POA: Diagnosis not present

## 2021-01-27 DIAGNOSIS — L2089 Other atopic dermatitis: Secondary | ICD-10-CM | POA: Diagnosis not present

## 2021-01-28 DIAGNOSIS — M8580 Other specified disorders of bone density and structure, unspecified site: Secondary | ICD-10-CM | POA: Diagnosis not present

## 2021-01-28 DIAGNOSIS — Z1339 Encounter for screening examination for other mental health and behavioral disorders: Secondary | ICD-10-CM | POA: Diagnosis not present

## 2021-01-28 DIAGNOSIS — N401 Enlarged prostate with lower urinary tract symptoms: Secondary | ICD-10-CM | POA: Diagnosis not present

## 2021-01-28 DIAGNOSIS — I129 Hypertensive chronic kidney disease with stage 1 through stage 4 chronic kidney disease, or unspecified chronic kidney disease: Secondary | ICD-10-CM | POA: Diagnosis not present

## 2021-01-28 DIAGNOSIS — J449 Chronic obstructive pulmonary disease, unspecified: Secondary | ICD-10-CM | POA: Diagnosis not present

## 2021-01-28 DIAGNOSIS — Z1389 Encounter for screening for other disorder: Secondary | ICD-10-CM | POA: Diagnosis not present

## 2021-01-28 DIAGNOSIS — I10 Essential (primary) hypertension: Secondary | ICD-10-CM | POA: Diagnosis not present

## 2021-01-28 DIAGNOSIS — R82998 Other abnormal findings in urine: Secondary | ICD-10-CM | POA: Diagnosis not present

## 2021-01-28 DIAGNOSIS — Z1331 Encounter for screening for depression: Secondary | ICD-10-CM | POA: Diagnosis not present

## 2021-01-28 DIAGNOSIS — I6529 Occlusion and stenosis of unspecified carotid artery: Secondary | ICD-10-CM | POA: Diagnosis not present

## 2021-01-28 DIAGNOSIS — E785 Hyperlipidemia, unspecified: Secondary | ICD-10-CM | POA: Diagnosis not present

## 2021-01-28 DIAGNOSIS — D692 Other nonthrombocytopenic purpura: Secondary | ICD-10-CM | POA: Diagnosis not present

## 2021-01-28 DIAGNOSIS — Z0000 Encounter for general adult medical examination without abnormal findings: Secondary | ICD-10-CM | POA: Diagnosis not present

## 2021-01-28 DIAGNOSIS — K219 Gastro-esophageal reflux disease without esophagitis: Secondary | ICD-10-CM | POA: Diagnosis not present

## 2021-01-28 DIAGNOSIS — N1831 Chronic kidney disease, stage 3a: Secondary | ICD-10-CM | POA: Diagnosis not present

## 2021-01-28 DIAGNOSIS — I69954 Hemiplegia and hemiparesis following unspecified cerebrovascular disease affecting left non-dominant side: Secondary | ICD-10-CM | POA: Diagnosis not present

## 2021-02-01 DIAGNOSIS — L2089 Other atopic dermatitis: Secondary | ICD-10-CM | POA: Diagnosis not present

## 2021-02-03 DIAGNOSIS — L2089 Other atopic dermatitis: Secondary | ICD-10-CM | POA: Diagnosis not present

## 2021-02-08 DIAGNOSIS — L2089 Other atopic dermatitis: Secondary | ICD-10-CM | POA: Diagnosis not present

## 2021-02-10 DIAGNOSIS — L2089 Other atopic dermatitis: Secondary | ICD-10-CM | POA: Diagnosis not present

## 2021-02-15 DIAGNOSIS — L2089 Other atopic dermatitis: Secondary | ICD-10-CM | POA: Diagnosis not present

## 2021-02-17 DIAGNOSIS — L2089 Other atopic dermatitis: Secondary | ICD-10-CM | POA: Diagnosis not present

## 2021-02-23 DIAGNOSIS — L2089 Other atopic dermatitis: Secondary | ICD-10-CM | POA: Diagnosis not present

## 2021-03-02 DIAGNOSIS — L2089 Other atopic dermatitis: Secondary | ICD-10-CM | POA: Diagnosis not present

## 2021-03-04 DIAGNOSIS — L2089 Other atopic dermatitis: Secondary | ICD-10-CM | POA: Diagnosis not present

## 2021-03-08 DIAGNOSIS — L2089 Other atopic dermatitis: Secondary | ICD-10-CM | POA: Diagnosis not present

## 2021-03-10 DIAGNOSIS — L2089 Other atopic dermatitis: Secondary | ICD-10-CM | POA: Diagnosis not present

## 2021-03-15 DIAGNOSIS — L2089 Other atopic dermatitis: Secondary | ICD-10-CM | POA: Diagnosis not present

## 2021-03-17 DIAGNOSIS — L2089 Other atopic dermatitis: Secondary | ICD-10-CM | POA: Diagnosis not present

## 2021-03-22 DIAGNOSIS — L2089 Other atopic dermatitis: Secondary | ICD-10-CM | POA: Diagnosis not present

## 2021-03-24 DIAGNOSIS — L2089 Other atopic dermatitis: Secondary | ICD-10-CM | POA: Diagnosis not present

## 2021-03-28 DIAGNOSIS — E785 Hyperlipidemia, unspecified: Secondary | ICD-10-CM | POA: Diagnosis not present

## 2021-03-28 DIAGNOSIS — M8580 Other specified disorders of bone density and structure, unspecified site: Secondary | ICD-10-CM | POA: Diagnosis not present

## 2021-03-28 DIAGNOSIS — I129 Hypertensive chronic kidney disease with stage 1 through stage 4 chronic kidney disease, or unspecified chronic kidney disease: Secondary | ICD-10-CM | POA: Diagnosis not present

## 2021-03-28 DIAGNOSIS — N1831 Chronic kidney disease, stage 3a: Secondary | ICD-10-CM | POA: Diagnosis not present

## 2021-03-31 DIAGNOSIS — L2089 Other atopic dermatitis: Secondary | ICD-10-CM | POA: Diagnosis not present

## 2021-04-05 DIAGNOSIS — L2089 Other atopic dermatitis: Secondary | ICD-10-CM | POA: Diagnosis not present

## 2021-04-07 DIAGNOSIS — L2089 Other atopic dermatitis: Secondary | ICD-10-CM | POA: Diagnosis not present

## 2021-04-12 DIAGNOSIS — L2089 Other atopic dermatitis: Secondary | ICD-10-CM | POA: Diagnosis not present

## 2021-04-14 DIAGNOSIS — L2089 Other atopic dermatitis: Secondary | ICD-10-CM | POA: Diagnosis not present

## 2021-04-19 DIAGNOSIS — L2089 Other atopic dermatitis: Secondary | ICD-10-CM | POA: Diagnosis not present

## 2021-04-21 DIAGNOSIS — L2089 Other atopic dermatitis: Secondary | ICD-10-CM | POA: Diagnosis not present

## 2021-04-27 DIAGNOSIS — L821 Other seborrheic keratosis: Secondary | ICD-10-CM | POA: Diagnosis not present

## 2021-04-27 DIAGNOSIS — L853 Xerosis cutis: Secondary | ICD-10-CM | POA: Diagnosis not present

## 2021-04-27 DIAGNOSIS — L2089 Other atopic dermatitis: Secondary | ICD-10-CM | POA: Diagnosis not present

## 2021-04-28 DIAGNOSIS — L2089 Other atopic dermatitis: Secondary | ICD-10-CM | POA: Diagnosis not present

## 2021-05-03 DIAGNOSIS — L2089 Other atopic dermatitis: Secondary | ICD-10-CM | POA: Diagnosis not present

## 2021-05-05 DIAGNOSIS — L2089 Other atopic dermatitis: Secondary | ICD-10-CM | POA: Diagnosis not present

## 2021-05-10 DIAGNOSIS — L2089 Other atopic dermatitis: Secondary | ICD-10-CM | POA: Diagnosis not present

## 2021-05-12 DIAGNOSIS — L2089 Other atopic dermatitis: Secondary | ICD-10-CM | POA: Diagnosis not present

## 2021-05-17 DIAGNOSIS — L2089 Other atopic dermatitis: Secondary | ICD-10-CM | POA: Diagnosis not present

## 2021-05-19 DIAGNOSIS — L2089 Other atopic dermatitis: Secondary | ICD-10-CM | POA: Diagnosis not present

## 2021-05-24 DIAGNOSIS — L2089 Other atopic dermatitis: Secondary | ICD-10-CM | POA: Diagnosis not present

## 2021-05-26 DIAGNOSIS — L2089 Other atopic dermatitis: Secondary | ICD-10-CM | POA: Diagnosis not present

## 2021-05-31 DIAGNOSIS — L2089 Other atopic dermatitis: Secondary | ICD-10-CM | POA: Diagnosis not present

## 2021-06-02 DIAGNOSIS — L2089 Other atopic dermatitis: Secondary | ICD-10-CM | POA: Diagnosis not present

## 2021-06-09 DIAGNOSIS — L2089 Other atopic dermatitis: Secondary | ICD-10-CM | POA: Diagnosis not present

## 2021-06-16 DIAGNOSIS — L2089 Other atopic dermatitis: Secondary | ICD-10-CM | POA: Diagnosis not present

## 2021-06-21 DIAGNOSIS — L2089 Other atopic dermatitis: Secondary | ICD-10-CM | POA: Diagnosis not present

## 2021-06-23 DIAGNOSIS — L2089 Other atopic dermatitis: Secondary | ICD-10-CM | POA: Diagnosis not present

## 2021-07-13 DIAGNOSIS — M810 Age-related osteoporosis without current pathological fracture: Secondary | ICD-10-CM | POA: Diagnosis not present

## 2021-07-13 DIAGNOSIS — I129 Hypertensive chronic kidney disease with stage 1 through stage 4 chronic kidney disease, or unspecified chronic kidney disease: Secondary | ICD-10-CM | POA: Diagnosis not present

## 2021-07-13 DIAGNOSIS — N1831 Chronic kidney disease, stage 3a: Secondary | ICD-10-CM | POA: Diagnosis not present

## 2021-07-27 DIAGNOSIS — Z1152 Encounter for screening for COVID-19: Secondary | ICD-10-CM | POA: Diagnosis not present

## 2021-07-27 DIAGNOSIS — R062 Wheezing: Secondary | ICD-10-CM | POA: Diagnosis not present

## 2021-07-27 DIAGNOSIS — R5383 Other fatigue: Secondary | ICD-10-CM | POA: Diagnosis not present

## 2021-07-27 DIAGNOSIS — J069 Acute upper respiratory infection, unspecified: Secondary | ICD-10-CM | POA: Diagnosis not present

## 2021-07-27 DIAGNOSIS — R051 Acute cough: Secondary | ICD-10-CM | POA: Diagnosis not present

## 2021-07-27 DIAGNOSIS — N1831 Chronic kidney disease, stage 3a: Secondary | ICD-10-CM | POA: Diagnosis not present

## 2021-07-27 DIAGNOSIS — I129 Hypertensive chronic kidney disease with stage 1 through stage 4 chronic kidney disease, or unspecified chronic kidney disease: Secondary | ICD-10-CM | POA: Diagnosis not present

## 2021-07-27 DIAGNOSIS — J449 Chronic obstructive pulmonary disease, unspecified: Secondary | ICD-10-CM | POA: Diagnosis not present

## 2021-07-27 DIAGNOSIS — R509 Fever, unspecified: Secondary | ICD-10-CM | POA: Diagnosis not present

## 2021-08-03 ENCOUNTER — Other Ambulatory Visit: Payer: Self-pay | Admitting: *Deleted

## 2021-08-03 DIAGNOSIS — Z9889 Other specified postprocedural states: Secondary | ICD-10-CM

## 2021-08-09 NOTE — Progress Notes (Signed)
History of Present Illness:  Patient is a 87 y.o. year old male who presents for evaluation of carotid stenosis surveillance.  He has a history of Left CEA in 2013 by Dr. Kellie Simmering for symptomatic stenosis.  He was seen 1 year ago and at that visit B ICA were < 39% stenosis.  He has ha no recurrent symptoms since. He denies any amaurosis fugax, monocular blindness, slurred speech, facial drooping, upper or lower extremity weakness or numbness.   He is medically managed on ASA and Statin  Past Medical History:  Diagnosis Date   Arthritis    Bilateral Hands   Carotid artery occlusion    Complication of anesthesia    difficulty awakening from anesthesia, & N & V   COPD (chronic obstructive pulmonary disease) (HCC)    GERD (gastroesophageal reflux disease)    Hyperlipidemia    Hypertension    PONV (postoperative nausea and vomiting)    Recurrent upper respiratory infection (URI)    last bout of bronchitis 04/2011.   Shortness of breath    at times due to COPD.   Stroke [facility])     Past Surgical History:  Procedure Laterality Date   APPENDECTOMY     ELBOW SURGERY     ENDARTERECTOMY  06/29/2011   Procedure: ENDARTERECTOMY CAROTID;  Surgeon: Mal Misty, MD;  Location: [facility];  Service: Vascular;  Laterality: Left;   EYE SURGERY  Aug. and Dec. 2012   Detatched retina and Macular Deg.   EYE SURGERY Bilateral 03/31/2013   Eyelid / Eyebrow   INGUINAL HERNIA REPAIR Right 08/17/2020   Procedure: LAPAROSCOPIC RIGHT INGUINAL HERNIA REPAIR;  Surgeon: Ralene Ok, MD;  Location: [facility];  Service: General;  Laterality: Right;   INSERTION OF MESH  08/17/2020   Procedure: INSERTION OF MESH;  Surgeon: Ralene Ok, MD;  Location: [facility];  Service: General;;   KIDNEY SURGERY     SHOULDER SURGERY       Social History Social History   Tobacco Use   Smoking status: Former    Packs/day: 1.00    Years: 35.00    Total pack years: 35.00    Types: Cigarettes    Quit date: 06/03/1991     Years since quitting: 30.2   Smokeless tobacco: Never  Vaping Use   Vaping Use: Never used  Substance Use Topics   Alcohol use: Yes    Alcohol/week: 12.0 standard drinks of alcohol    Types: 12 Cans of beer per week    Comment: Drinks a beer daily   Drug use: No    Family History Family History  Problem Relation Age of Onset   Heart disease Mother        Heart Disease before age 54   Hypertension Mother    Hyperlipidemia Mother    Heart attack Mother    Varicose Veins Mother    Kidney disease Father    Anesthesia problems Neg Hx    Colon cancer Neg Hx     Allergies  Allergies  Allergen Reactions   Vicodin [Hydrocodone-Acetaminophen] Other (See Comments)    Hallucinations.   Latex Rash   Tape Itching and Rash    Paper Tape is okay.     Current Outpatient Medications  Medication Sig Dispense Refill   acetaminophen (TYLENOL) 500 MG tablet Take 500 mg by mouth every 6 (six) hours as needed for moderate pain.     aspirin 81 MG tablet Take 81 mg by mouth daily.  atorvastatin (LIPITOR) 40 MG tablet Take 40 mg by mouth daily.     calcium carbonate (TUMS - DOSED IN MG ELEMENTAL CALCIUM) 500 MG chewable tablet Chew 1,000 mg by mouth daily as needed for indigestion or heartburn.     clobetasol cream (TEMOVATE) 7.35 % Apply 1 application topically daily.     famotidine (PEPCID) 20 MG tablet Take 1 tablet (20 mg total) by mouth 2 (two) times daily. 30 tablet 3   nebivolol (BYSTOLIC) 5 MG tablet Take 5 mg by mouth daily.     omeprazole (PRILOSEC) 40 MG capsule TAKE 1 CAPSULE (40 MG TOTAL) BY MOUTH DAILY. 90 capsule 2   Polyethyl Glyc-Propyl Glyc PF (SYSTANE HYDRATION PF) 0.4-0.3 % SOLN Place 1 drop into both eyes daily.     tamsulosin (FLOMAX) 0.4 MG CAPS capsule Take 0.4 mg by mouth daily.     Tiotropium Bromide-Olodaterol (STIOLTO RESPIMAT) 2.5-2.5 MCG/ACT AERS Inhale 2 puffs into the lungs daily.     triamcinolone cream (KENALOG) 0.1 % Apply 1 application topically 2 (two)  times daily as needed (rash).     hydrocortisone 2.5 % cream Apply 1 application topically 3 (three) times daily. (Patient not taking: Reported on 08/10/2021)     hydrOXYzine (ATARAX/VISTARIL) 25 MG tablet Take 25 mg by mouth daily. (Patient not taking: Reported on 08/10/2021)     traMADol (ULTRAM) 50 MG tablet Take 1 tablet (50 mg total) by mouth every 6 (six) hours as needed. (Patient not taking: Reported on 08/10/2021) 20 tablet 0   No current facility-administered medications for this visit.    ROS:   General:  positive weight loss, Fever, chills  HEENT: No recent headaches, no nasal bleeding, no visual changes, no sore throat  Neurologic: No dizziness, blackouts, seizures. No recent symptoms of stroke or mini- stroke. No recent episodes of slurred speech, or temporary blindness.  Cardiac: No recent episodes of chest pain/pressure, no shortness of breath at rest.  No shortness of breath with exertion.  Denies history of atrial fibrillation or irregular heartbeat  Vascular: No history of rest pain in feet.  No history of claudication.  No history of non-healing ulcer, No history of DVT   Pulmonary: No home oxygen, no productive cough, no hemoptysis,  No asthma or wheezing  Musculoskeletal:  '[ ]'$  Arthritis, '[ ]'$  Low back pain,  '[ ]'$  Joint pain  Hematologic:No history of hypercoagulable state.  No history of easy bleeding.  No history of anemia  Gastrointestinal: No hematochezia or melena,  No gastroesophageal reflux, no trouble swallowing  Urinary: '[ ]'$  chronic Kidney disease, '[ ]'$  on HD - '[ ]'$  MWF or '[ ]'$  TTHS, '[ ]'$  Burning with urination, '[ ]'$  Frequent urination, '[ ]'$  Difficulty urinating;   Skin: No rashes  Psychological: No history of anxiety,  No history of depression   Physical Examination  Vitals:   08/10/21 0836 08/10/21 0839  BP: (!) 164/70 (!) 147/70  Pulse: (!) 58   Resp: 18   Temp: 98.3 F (36.8 C)   TempSrc: Temporal   SpO2: 96%   Weight: 165 lb 9.6 oz (75.1 kg)    Height: '5\' 11"'$  (1.803 m)     Body mass index is 23.1 kg/m.  General:  Alert and oriented, no acute distress HEENT: Normal Neck: No bruit or JVD Pulmonary: Clear to auscultation bilaterally Cardiac: Regular Rate and Rhythm without murmur Gastrointestinal: Soft, non-tender, non-distended, no mass, no scars Skin: No rash Extremity Pulses:  2+ radial bilaterally Musculoskeletal: No deformity  or edema  Neurologic: Upper and lower extremity motor 5/5 and symmetric  DATA:     Right Carotid Findings:  +----------+--------+--------+--------+------------------+---------+            PSV cm/sEDV cm/sStenosisPlaque DescriptionComments   +----------+--------+--------+--------+------------------+---------+  CCA Prox  59      9                                            +----------+--------+--------+--------+------------------+---------+  CCA Mid   66      12                                           +----------+--------+--------+--------+------------------+---------+  CCA Distal53      9               heterogenous                 +----------+--------+--------+--------+------------------+---------+  ICA Prox  111     32      1-39%   calcific          Shadowing  +----------+--------+--------+--------+------------------+---------+  ICA Mid   69      18                                           +----------+--------+--------+--------+------------------+---------+  ICA Distal71      20                                           +----------+--------+--------+--------+------------------+---------+  ECA       91      10                                           +----------+--------+--------+--------+------------------+---------+   +----------+--------+-------+----------------+-------------------+            PSV cm/sEDV cmsDescribe        Arm Pressure (mmHG)  +----------+--------+-------+----------------+-------------------+   [id number]            Multiphasic, [id number]                  +----------+--------+-------+----------------+-------------------+   +---------+--------+--+--------+-+---------+  VertebralPSV cm/s56EDV cm/s8Antegrade  +---------+--------+--+--------+-+---------+       Left Carotid Findings:  +----------+--------+--------+--------+------------------+--------+            PSV cm/sEDV cm/sStenosisPlaque DescriptionComments  +----------+--------+--------+--------+------------------+--------+  CCA Prox  70      7                                           +----------+--------+--------+--------+------------------+--------+  CCA Mid   87      9               homogeneous                 +----------+--------+--------+--------+------------------+--------+  CCA Distal49      8  heterogenous                +----------+--------+--------+--------+------------------+--------+  ICA Prox  50      12      1-39%   homogeneous                 +----------+--------+--------+--------+------------------+--------+  ICA Mid   81      20                                          +----------+--------+--------+--------+------------------+--------+  ICA Distal84      18                                          +----------+--------+--------+--------+------------------+--------+  ECA       87      10                                          +----------+--------+--------+--------+------------------+--------+   +----------+--------+--------+----------------+-------------------+            PSV cm/sEDV cm/sDescribe        Arm Pressure (mmHG)  +----------+--------+--------+----------------+-------------------+  [id number]             Multiphasic, [id number]                  +----------+--------+--------+----------------+-------------------+   +---------+--------+--+--------+--+---------+  VertebralPSV cm/s57EDV  cm/s10Antegrade  +---------+--------+--+--------+--+---------+           Summary:  Right Carotid: Velocities in the right ICA are consistent with a 1-39%  stenosis.   Left Carotid: Velocities in the left ICA are consistent with a 1-39%  stenosis.   Vertebrals:  Bilateral vertebral arteries demonstrate antegrade flow.  Subclavians: Normal flow hemodynamics were seen in bilateral subclavian               arteries.   ASSESSMENT/PLAN: asymptomatic Carotid stenosis s/p left CEA by Dr. Kellie Simmering 2013  The duplex demonstrates no recurrent stenosis B ICA's < 39 % stenosis He remains asymptomatic for stroke symptoms.  Continue medical management with ASA and Statin daily.  F/U in 18 months for repeat carotid duplex surveillance.       Roxy Horseman PA-C [facility] [phone]  MD in clinic [facility]

## 2021-08-10 ENCOUNTER — Ambulatory Visit (HOSPITAL_COMMUNITY)
Admission: RE | Admit: 2021-08-10 | Discharge: 2021-08-10 | Disposition: A | Discharge status: Home / Self Care | Payer: Medicare HMO | Source: Ambulatory Visit | Attending: Vascular Surgery | Admitting: Vascular Surgery

## 2021-08-10 ENCOUNTER — Ambulatory Visit (INDEPENDENT_AMBULATORY_CARE_PROVIDER_SITE_OTHER): Payer: Medicare HMO | Admitting: Physician Assistant

## 2021-08-10 VITALS — BP 147/70 | HR 58 | Temp 98.3°F | Resp 18 | Ht 71.0 in | Wt 165.6 lb

## 2021-08-10 DIAGNOSIS — I6523 Occlusion and stenosis of bilateral carotid arteries: Secondary | ICD-10-CM

## 2021-08-10 DIAGNOSIS — Z9889 Other specified postprocedural states: Secondary | ICD-10-CM | POA: Diagnosis not present

## 2021-08-13 ENCOUNTER — Other Ambulatory Visit: Payer: Self-pay | Admitting: Gastroenterology

## 2022-02-03 DIAGNOSIS — R7989 Other specified abnormal findings of blood chemistry: Secondary | ICD-10-CM | POA: Diagnosis not present

## 2022-02-03 DIAGNOSIS — I10 Essential (primary) hypertension: Secondary | ICD-10-CM | POA: Diagnosis not present

## 2022-02-03 DIAGNOSIS — Z125 Encounter for screening for malignant neoplasm of prostate: Secondary | ICD-10-CM | POA: Diagnosis not present

## 2022-02-03 DIAGNOSIS — N1831 Chronic kidney disease, stage 3a: Secondary | ICD-10-CM | POA: Diagnosis not present

## 2022-02-03 DIAGNOSIS — E785 Hyperlipidemia, unspecified: Secondary | ICD-10-CM | POA: Diagnosis not present

## 2022-02-10 DIAGNOSIS — Z0000 Encounter for general adult medical examination without abnormal findings: Secondary | ICD-10-CM | POA: Diagnosis not present

## 2022-02-10 DIAGNOSIS — Z1331 Encounter for screening for depression: Secondary | ICD-10-CM | POA: Diagnosis not present

## 2022-02-10 DIAGNOSIS — M8580 Other specified disorders of bone density and structure, unspecified site: Secondary | ICD-10-CM | POA: Diagnosis not present

## 2022-02-10 DIAGNOSIS — R82998 Other abnormal findings in urine: Secondary | ICD-10-CM | POA: Diagnosis not present

## 2022-02-10 DIAGNOSIS — I129 Hypertensive chronic kidney disease with stage 1 through stage 4 chronic kidney disease, or unspecified chronic kidney disease: Secondary | ICD-10-CM | POA: Diagnosis not present

## 2022-02-10 DIAGNOSIS — Z1339 Encounter for screening examination for other mental health and behavioral disorders: Secondary | ICD-10-CM | POA: Diagnosis not present

## 2022-02-10 DIAGNOSIS — N1831 Chronic kidney disease, stage 3a: Secondary | ICD-10-CM | POA: Diagnosis not present

## 2022-02-10 DIAGNOSIS — D692 Other nonthrombocytopenic purpura: Secondary | ICD-10-CM | POA: Diagnosis not present

## 2022-02-10 DIAGNOSIS — E785 Hyperlipidemia, unspecified: Secondary | ICD-10-CM | POA: Diagnosis not present

## 2022-02-10 DIAGNOSIS — I69954 Hemiplegia and hemiparesis following unspecified cerebrovascular disease affecting left non-dominant side: Secondary | ICD-10-CM | POA: Diagnosis not present

## 2022-02-10 DIAGNOSIS — Z23 Encounter for immunization: Secondary | ICD-10-CM | POA: Diagnosis not present

## 2022-02-10 DIAGNOSIS — J449 Chronic obstructive pulmonary disease, unspecified: Secondary | ICD-10-CM | POA: Diagnosis not present

## 2022-02-10 DIAGNOSIS — I6529 Occlusion and stenosis of unspecified carotid artery: Secondary | ICD-10-CM | POA: Diagnosis not present

## 2022-04-04 ENCOUNTER — Other Ambulatory Visit: Payer: Self-pay | Admitting: Gastroenterology

## 2022-05-08 ENCOUNTER — Other Ambulatory Visit: Payer: Self-pay | Admitting: Gastroenterology

## 2022-05-11 ENCOUNTER — Other Ambulatory Visit: Payer: Self-pay | Admitting: Gastroenterology

## 2022-05-28 ENCOUNTER — Other Ambulatory Visit: Payer: Self-pay | Admitting: Gastroenterology

## 2022-06-01 ENCOUNTER — Other Ambulatory Visit (INDEPENDENT_AMBULATORY_CARE_PROVIDER_SITE_OTHER): Payer: Medicare HMO

## 2022-06-01 ENCOUNTER — Ambulatory Visit: Payer: Medicare HMO | Admitting: Orthopedic Surgery

## 2022-06-01 DIAGNOSIS — M25522 Pain in left elbow: Secondary | ICD-10-CM

## 2022-06-01 DIAGNOSIS — M25512 Pain in left shoulder: Secondary | ICD-10-CM | POA: Diagnosis not present

## 2022-06-01 DIAGNOSIS — R2232 Localized swelling, mass and lump, left upper limb: Secondary | ICD-10-CM

## 2022-06-01 DIAGNOSIS — G8929 Other chronic pain: Secondary | ICD-10-CM

## 2022-06-02 DIAGNOSIS — L2089 Other atopic dermatitis: Secondary | ICD-10-CM | POA: Diagnosis not present

## 2022-06-04 ENCOUNTER — Encounter: Payer: Self-pay | Admitting: Orthopedic Surgery

## 2022-06-04 NOTE — Progress Notes (Signed)
Office Visit Note   Patient: Robert Bowers           Date of Birth: 05/09/1934           MRN: [id number] Visit Date: 06/01/2022 Requested by: Cleatis Polka., MD [address] [place],  [place] [address] PCP: Cleatis Polka., MD  Subjective: Chief Complaint  Patient presents with   Left Arm - Pain    HPI: Robert Bowers is a 88 y.o. male who presents to the office reporting left elbow not.  He describes mild pain in the antecubital fossa which has been present for many months.  Denies any radiating pain into the forearm.  Denies any numbness and tingling.  Pain radiates up and down the arm.  He denies any loss of bicep strength..                ROS: All systems reviewed are negative as they relate to the chief complaint within the history of present illness.  Patient denies fevers or chills.  Assessment & Plan: Visit Diagnoses:  1. Chronic left shoulder pain   2. Pain in left elbow   3. Mass of left elbow     Plan: Impression is left elbow soft tissue mass.  Not cystic by ultrasound examination.  Could be a lipoma.  Is freely mobile.  Measures about 2-1/2 x 2 and half centimeters.  Nontender to palpation.  In the region of the lacertus and it could conceivably be irritating lateral antebrachial cutaneous nerve.  Plan MRI with IV contrast of the left elbow to evaluate this mass.  Follow-up after that study  Follow-Up Instructions: No follow-ups on file.   Orders:  Orders Placed This Encounter  Procedures   XR Shoulder Left   XR Elbow 2 Views Left   MR ELBOW LEFT W WO CONTRAST   No orders of the defined types were placed in this encounter.     Procedures: No procedures performed   Clinical Data: No additional findings.  Objective: Vital Signs: There were no vitals taken for this visit.  Physical Exam:  Constitutional: Patient appears well-developed HEENT:  Head: Normocephalic Eyes:EOM are normal Neck: Normal range of motion Cardiovascular: Normal  rate Pulmonary/chest: Effort normal Neurologic: Patient is alert Skin: Skin is warm Psychiatric: Patient has normal mood and affect  Ortho Exam: Ortho exam demonstrates full active and passive range of motion of the left elbow including flexion extension pronation supination.  Has excellent supination strength.  Biceps tendon is palpable.  Triceps tendon is palpable.  There is a 2-1/2 x 3 cm mobile elbow mass in the antecubital fossa medial to the biceps tendon.  No proximal lymphadenopathy.  Ultrasound examination demonstrates noncystic nature to the mass.  It is just proximal to the lacertus fibrosis  Specialty Comments:  No specialty comments available.  Imaging: No results found.   PMFS History: Patient Active Problem List   Diagnosis Date Noted   Gastroesophageal reflux disease 03/26/2020   Globus sensation 03/26/2020   Aftercare following surgery of the circulatory system, NEC 07/23/2013   Occlusion and stenosis of carotid artery without mention of cerebral infarction 06/07/2011   Past Medical History:  Diagnosis Date   Arthritis    Bilateral Hands   Carotid artery occlusion    Complication of anesthesia    difficulty awakening from anesthesia, & N & V   COPD (chronic obstructive pulmonary disease) (HCC)    GERD (gastroesophageal reflux disease)    Hyperlipidemia  Hypertension    PONV (postoperative nausea and vomiting)    Recurrent upper respiratory infection (URI)    last bout of bronchitis 04/2011.   Shortness of breath    at times due to COPD.   Stroke [facility](HCC)     Family History  Problem Relation Age of Onset   Heart disease Mother        Heart Disease before age 86   Hypertension Mother    Hyperlipidemia Mother    Heart attack Mother    Varicose Veins Mother    Kidney disease Father    Anesthesia problems Neg Hx    Colon cancer Neg Hx     Past Surgical History:  Procedure Laterality Date   APPENDECTOMY     ELBOW SURGERY     ENDARTERECTOMY  06/29/2011    Procedure: ENDARTERECTOMY CAROTID;  Surgeon: Pryor OchoaJames D Lawson, MD;  [facility];  Service: Vascular;  Laterality: Left;   EYE SURGERY  Aug. and Dec. 2012   Detatched retina and Macular Deg.   EYE SURGERY Bilateral 03/31/2013   Eyelid / Eyebrow   INGUINAL HERNIA REPAIR Right 08/17/2020   Procedure: LAPAROSCOPIC RIGHT INGUINAL HERNIA REPAIR;  Surgeon: Axel Filleramirez, Armando, MD;  [facility];  Service: General;  Laterality: Right;   INSERTION OF MESH  08/17/2020   Procedure: INSERTION OF MESH;  Surgeon: Axel Filleramirez, Armando, MD;  Location[facility];  Service: General;;   KIDNEY SURGERY     SHOULDER SURGERY     Social History   Occupational History   Occupation: retired  Tobacco Use   Smoking status: Former    Packs/day: 1.00    Years: 35.00    Additional pack years: 0.00    Total pack years: 35.00    Types: Cigarettes    Quit date: 06/03/1991    Years since quitting: 31.0   Smokeless tobacco: Never  Vaping Use   Vaping Use: Never used  Substance and Sexual Activity   Alcohol use: Yes    Alcohol/week: 12.0 standard drinks of alcohol    Types: 12 Cans of beer per week    Comment: Drinks a beer daily   Drug use: No   Sexual activity: Not on file

## 2022-06-06 DIAGNOSIS — H35371 Puckering of macula, right eye: Secondary | ICD-10-CM | POA: Diagnosis not present

## 2022-06-06 DIAGNOSIS — H2513 Age-related nuclear cataract, bilateral: Secondary | ICD-10-CM | POA: Diagnosis not present

## 2022-06-18 ENCOUNTER — Ambulatory Visit (HOSPITAL_COMMUNITY)
Admission: RE | Admit: 2022-06-18 | Discharge: 2022-06-18 | Disposition: A | Discharge status: Home / Self Care | Payer: Medicare HMO | Source: Ambulatory Visit | Attending: Orthopedic Surgery | Admitting: Orthopedic Surgery

## 2022-06-18 DIAGNOSIS — D1739 Benign lipomatous neoplasm of skin and subcutaneous tissue of other sites: Secondary | ICD-10-CM | POA: Diagnosis not present

## 2022-06-18 DIAGNOSIS — M25522 Pain in left elbow: Secondary | ICD-10-CM | POA: Insufficient documentation

## 2022-06-18 MED ORDER — GADOBUTROL 1 MMOL/ML IV SOLN
7.5000 mL | Freq: Once | INTRAVENOUS | Status: AC | PRN
  Administered 2022-06-18: 7.5 mL via INTRAVENOUS

## 2022-06-23 NOTE — Progress Notes (Signed)
I called Griff.  This is not bothering him too much.  We will check him again in 6 months.  Would you mind making that appointment and canceling any future appointments he has to review the scan.  Thanks

## 2022-08-24 ENCOUNTER — Other Ambulatory Visit: Payer: Self-pay | Admitting: Gastroenterology

## 2022-10-05 DIAGNOSIS — L2089 Other atopic dermatitis: Secondary | ICD-10-CM | POA: Diagnosis not present

## 2022-12-08 DIAGNOSIS — R3912 Poor urinary stream: Secondary | ICD-10-CM | POA: Diagnosis not present

## 2022-12-08 DIAGNOSIS — R351 Nocturia: Secondary | ICD-10-CM | POA: Diagnosis not present

## 2022-12-08 DIAGNOSIS — R3911 Hesitancy of micturition: Secondary | ICD-10-CM | POA: Diagnosis not present

## 2022-12-08 DIAGNOSIS — N401 Enlarged prostate with lower urinary tract symptoms: Secondary | ICD-10-CM | POA: Diagnosis not present

## 2022-12-30 ENCOUNTER — Other Ambulatory Visit: Payer: Self-pay | Admitting: *Deleted

## 2022-12-30 DIAGNOSIS — I6523 Occlusion and stenosis of bilateral carotid arteries: Secondary | ICD-10-CM

## 2023-01-02 ENCOUNTER — Ambulatory Visit: Payer: Medicare HMO | Admitting: Orthopedic Surgery

## 2023-01-02 DIAGNOSIS — R2232 Localized swelling, mass and lump, left upper limb: Secondary | ICD-10-CM

## 2023-01-04 ENCOUNTER — Encounter: Payer: Self-pay | Admitting: Orthopedic Surgery

## 2023-01-04 NOTE — Progress Notes (Signed)
Office Visit Note   Patient: Robert Bowers           Date of Birth: 1934/08/15           MRN: [id number] Visit Date: 01/02/2023 Requested by: Cleatis Polka., MD [address] [place],  [place] [address] PCP: Cleatis Polka., MD  Subjective: Chief Complaint  Patient presents with   Other     Follow up knot on left elbow    HPI: Robert Bowers is a 88 y.o. male who presents to the office reporting left elbow pain.  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  The pain comes and goes.  He feels like he has a shooting pain associated with this mass that goes up and down the arm.  Masses around 3 x 4 cm.  Does have "nerve pain" on a daily basis.  Also has a history of significant injury to that left elbow about 20 years ago and the arthritis present which has been there for a while is not giving him the same pain symptoms that he associates with this mass.  MRI scan of the elbow demonstrates a 3 x 4 cm lipomatous mass within the pronator teres muscle most consistent with a simple intramuscular lipoma with no concerning features..                ROS: All systems reviewed are negative as they relate to the chief complaint within the history of present illness.  Patient denies fevers or chills.  Assessment & Plan: Visit Diagnoses:  1. Mass of left elbow     Plan: Impression is symptomatic left elbow lipoma.  I think it may be irritating some branches of that medial antebrachial cutaneous nerve.  Robert Bowers would like to have this excised.  I do not think that is unreasonable at this time.  Risk and benefits are discussed with the patient including not limited to infection nerve and vessel damage as well as incomplete pain relief.  All questions answered.  Follow-Up Instructions: No follow-ups on file.   Orders:  No orders of the defined types were placed in this encounter.  No orders of the defined types were placed in this encounter.      Procedures: No procedures performed   Clinical Data: No additional findings.  Objective: Vital Signs: There were no vitals taken for this visit.  Physical Exam:  Constitutional: Patient appears well-developed HEENT:  Head: Normocephalic Eyes:EOM are normal Neck: Normal range of motion Cardiovascular: Normal rate Pulmonary/chest: Effort normal Neurologic: Patient is alert Skin: Skin is warm Psychiatric: Patient has normal mood and affect  Ortho Exam: Ortho exam demonstrates lack of about 10 degrees of full extension in that left elbow with flexion to around 125.  Pronation supination full.  Well-healed surgical incision on the lateral aspect of the elbow.  3 x 4 cm lipomatous mass just distal to the medial epicondyle.  Negative Tinel's in this region.  Ulnar nerve does not subluxate.  Specialty Comments:  No specialty comments available.  Imaging: No results found.   PMFS History: Patient Active Problem List   Diagnosis Date Noted   Gastroesophageal reflux disease 03/26/2020   Globus sensation 03/26/2020   Aftercare following surgery of the circulatory system, NEC 07/23/2013   Occlusion and stenosis of carotid artery without mention of cerebral infarction 06/07/2011   Past Medical History:  Diagnosis Date   Arthritis    Bilateral Hands  Carotid artery occlusion    Complication of anesthesia    difficulty awakening from anesthesia, & N & V   COPD (chronic obstructive pulmonary disease) (HCC)    GERD (gastroesophageal reflux disease)    Hyperlipidemia    Hypertension    PONV (postoperative nausea and vomiting)    Recurrent upper respiratory infection (URI)    last bout of bronchitis 04/2011.   Shortness of breath    at times due to COPD.   Stroke [facility])     Family History  Problem Relation Age of Onset   Heart disease Mother        Heart Disease before age 60   Hypertension Mother    Hyperlipidemia Mother    Heart attack Mother    Varicose Veins Mother     Kidney disease Father    Anesthesia problems Neg Hx    Colon cancer Neg Hx     Past Surgical History:  Procedure Laterality Date   APPENDECTOMY     ELBOW SURGERY     ENDARTERECTOMY  06/29/2011   Procedure: ENDARTERECTOMY CAROTID;  Surgeon: Pryor Ochoa, MD;  Location: [facility] OR;  Service: Vascular;  Laterality: Left;   EYE SURGERY  Aug. and Dec. 2012   Detatched retina and Macular Deg.   EYE SURGERY Bilateral 03/31/2013   Eyelid / Eyebrow   INGUINAL HERNIA REPAIR Right 08/17/2020   Procedure: LAPAROSCOPIC RIGHT INGUINAL HERNIA REPAIR;  Surgeon: Axel Filler, MD;  Location: [facility] OR;  Service: General;  Laterality: Right;   INSERTION OF MESH  08/17/2020   Procedure: INSERTION OF MESH;  Surgeon: Axel Filler, MD;  Location: [facility] OR;  Service: General;;   KIDNEY SURGERY     SHOULDER SURGERY     Social History   Occupational History   Occupation: retired  Tobacco Use   Smoking status: Former    Current packs/day: 0.00    Average packs/day: 1 pack/day for 35.0 years (35.0 ttl pk-yrs)    Types: Cigarettes    Start date: 06/02/1956    Quit date: 06/03/1991    Years since quitting: 31.6   Smokeless tobacco: Never  Vaping Use   Vaping status: Never Used  Substance and Sexual Activity   Alcohol use: Yes    Alcohol/week: 12.0 standard drinks of alcohol    Types: 12 Cans of beer per week    Comment: Drinks a beer daily   Drug use: No   Sexual activity: Not on file

## 2023-01-05 ENCOUNTER — Ambulatory Visit (HOSPITAL_COMMUNITY): Payer: Medicare HMO

## 2023-01-05 ENCOUNTER — Ambulatory Visit: Payer: Medicare HMO

## 2023-01-05 DIAGNOSIS — G245 Blepharospasm: Secondary | ICD-10-CM | POA: Diagnosis not present

## 2023-01-05 DIAGNOSIS — H02403 Unspecified ptosis of bilateral eyelids: Secondary | ICD-10-CM | POA: Diagnosis not present

## 2023-01-05 DIAGNOSIS — H35351 Cystoid macular degeneration, right eye: Secondary | ICD-10-CM | POA: Diagnosis not present

## 2023-01-05 DIAGNOSIS — Z8669 Personal history of other diseases of the nervous system and sense organs: Secondary | ICD-10-CM | POA: Diagnosis not present

## 2023-01-05 DIAGNOSIS — Z961 Presence of intraocular lens: Secondary | ICD-10-CM | POA: Diagnosis not present

## 2023-01-05 DIAGNOSIS — H5213 Myopia, bilateral: Secondary | ICD-10-CM | POA: Diagnosis not present

## 2023-01-06 ENCOUNTER — Ambulatory Visit (HOSPITAL_COMMUNITY)
Admission: RE | Admit: 2023-01-06 | Discharge: 2023-01-06 | Disposition: A | Discharge status: Home / Self Care | Payer: Medicare HMO | Source: Ambulatory Visit | Attending: Physician Assistant | Admitting: Physician Assistant

## 2023-01-06 DIAGNOSIS — I6523 Occlusion and stenosis of bilateral carotid arteries: Secondary | ICD-10-CM

## 2023-01-18 ENCOUNTER — Encounter (HOSPITAL_COMMUNITY): Payer: Self-pay

## 2023-01-18 NOTE — Progress Notes (Signed)
Surgical Instructions    Your procedure is scheduled on Tuesday, 01/24/23.  Report to [facility] Main Entrance "A" at 5:30 A.M., then check in with the Admitting office.  Call this number if you have problems the morning of surgery:  [phone]   If you have any questions prior to your surgery date call [phone]: Open Monday-Friday 8am-4pm If you experience any cold or flu symptoms such as cough, fever, chills, shortness of breath, etc. between now and your scheduled surgery, please notify us at the above number     Remember:  Do not eat after midnight the night before your surgery-Monday  You may drink clear liquids until 4:30 AM, the morning of your surgery.   Clear liquids allowed are: Water, Non-Citrus Juices (without pulp), Carbonated Beverages, Clear Tea, Black Coffee ONLY (NO MILK, CREAM OR POWDERED CREAMER of any kind), and Gatorade   Please complete your PRE-SURGERY ENSURE that was provided to you by 4:30 AM, the morning of surgery.  Please, if able, drink it in one setting. DO NOT SIP.  This will be the last thing you will drink.    Take these medicines the morning of surgery with A SIP OF WATER:  acetaminophen (TYLENOL) if needed atorvastatin (LIPITOR)  famotidine (PEPCID)  nebivolol (BYSTOLIC)  omeprazole (PRILOSEC)  tamsulosin (FLOMAX)  ketorolac (ACULAR) eye drops SYSTANE EYE DROPS prednisoLONE acetate (PRED FORTE) eye drops Tiotropium Bromide-Olodaterol Inhaler    As of today, STOP taking any Aspirin (unless otherwise instructed by your surgeon) Aleve, Naproxen, Ibuprofen, Motrin, Advil, Goody's, BC's, all herbal medications, fish oil, and all vitamins.           Do not wear jewelry  Do not wear lotions, powders, cologne or deodorant. Men may shave face and neck. Do not bring valuables to the hospital.  [facility] is not responsible for any belongings or valuables.    Do NOT Smoke (Tobacco/Vaping)  24 hours prior to your procedure  If you use a  CPAP at night, you may bring your mask for your overnight stay.   Contacts, glasses, hearing aids, dentures or partials may not be worn into surgery, please bring cases for these belongings    Patients discharged the day of surgery will not be allowed to drive home, and someone needs to stay with them for 24 hours.   SURGICAL WAITING ROOM VISITATION Patients having surgery or a procedure may have no more than 2 support people in the waiting area - these visitors may rotate.   Children under the age of 11 must have an adult with them who is not the patient. If the patient needs to stay at the hospital during part of their recovery, the visitor guidelines for inpatient rooms apply. Pre-op nurse will coordinate an appropriate time for 1 support person to accompany patient in pre-op.  This support person may not rotate.   Please refer to [email or web address] for the visitor guidelines for Inpatients (after your surgery is over and you are in a regular room).    Special instructions:    Oral Hygiene is also important to reduce your risk of infection.  Remember - BRUSH YOUR TEETH THE MORNING OF SURGERY WITH YOUR REGULAR TOOTHPASTE   [place]- Preparing For Surgery  Before surgery, you can play an important role. Because skin is not sterile, your skin needs to be as free of germs as possible. You can reduce the number of germs on your skin by washing with CHG (chlorahexidine gluconate) Soap before surgery.  CHG is an antiseptic cleaner which kills germs and bonds with the skin to continue killing germs even after washing.     Please do not use if you have an allergy to CHG or antibacterial soaps. If your skin becomes reddened/irritated stop using the CHG.  Do not shave (including legs and underarms) for at least 48 hours prior to first CHG shower. It is OK to shave your face.  Please follow these instructions  carefully.       Pre-operative 5 CHG Bathing Instructions   You can play a key role in reducing the risk of infection after surgery. Your skin needs to be as free of germs as possible. You can reduce the number of germs on your skin by washing with CHG (chlorhexidine gluconate) soap before surgery. CHG is an antiseptic soap that kills germs and continues to kill germs even after washing.   DO NOT use if you have an allergy to chlorhexidine/CHG or antibacterial soaps. If your skin becomes reddened or irritated, stop using the CHG and notify one of our RNs at [phone].   Please shower with the CHG soap starting 4 days before surgery using the following schedule:     Please keep in mind the following:  DO NOT shave, including legs and underarms, starting the day of your first shower.   You may shave your face at any point before/day of surgery.  Place clean sheets on your bed the day you start using CHG soap. Use a clean washcloth (not used since being washed) for each shower. DO NOT sleep with pets once you start using the CHG.   CHG Shower Instructions:  Wash your face and private area with normal soap. If you choose to wash your hair, wash first with your normal shampoo.  After you use shampoo/soap, rinse your hair and body thoroughly to remove shampoo/soap residue.  Turn the water OFF and apply about 3 tablespoons (45 ml) of CHG soap to a CLEAN washcloth.  Apply CHG soap ONLY FROM YOUR NECK DOWN TO YOUR TOES (washing for 3-5 minutes)  DO NOT use CHG soap on face, private areas, open wounds, or sores.  Pay special attention to the area where your surgery is being performed.  If you are having back surgery, having someone wash your back for you may be helpful. Wait 2 minutes after CHG soap is applied, then you may rinse off the CHG soap.  Pat dry with a clean towel  Put on clean clothes/pajamas   If you choose to wear lotion, please use ONLY the CHG-compatible lotions on the back  of this paper.   Additional instructions for the day of surgery: DO NOT APPLY any lotions, deodorants, cologne, or perfumes.   Do not bring valuables to the hospital. [facility] is not responsible for any belongings/valuables. Do not wear nail polish, gel polish, artificial nails, or any other type of covering on natural nails (fingers and toes) Do not wear jewelry or makeup Put on clean/comfortable clothes.  Please brush your teeth.  Ask your nurse before applying any prescription medications to the skin.     CHG Compatible Lotions   Aveeno Moisturizing lotion  Cetaphil Moisturizing Cream  Cetaphil Moisturizing Lotion  Clairol Herbal Essence Moisturizing Lotion, Dry Skin  Clairol Herbal Essence Moisturizing Lotion, Extra Dry Skin  Clairol Herbal Essence Moisturizing Lotion, Normal Skin  Curel Age Defying Therapeutic Moisturizing Lotion with Alpha Hydroxy  Curel Extreme Care Body Lotion  Curel Soothing Hands Moisturizing Hand Lotion  Curel Therapeutic Moisturizing Cream, Fragrance-Free  Curel Therapeutic Moisturizing Lotion, Fragrance-Free  Curel Therapeutic Moisturizing Lotion, Original Formula  Eucerin Daily Replenishing Lotion  Eucerin Dry Skin Therapy Plus Alpha Hydroxy Crme  Eucerin Dry Skin Therapy Plus Alpha Hydroxy Lotion  Eucerin Original Crme  Eucerin Original Lotion  Eucerin Plus Crme Eucerin Plus Lotion  Eucerin TriLipid Replenishing Lotion  Keri Anti-Bacterial Hand Lotion  Keri Deep Conditioning Original Lotion Dry Skin Formula Softly Scented  Keri Deep Conditioning Original Lotion, Fragrance Free Sensitive Skin Formula  Keri Lotion Fast Absorbing Fragrance Free Sensitive Skin Formula  Keri Lotion Fast Absorbing Softly Scented Dry Skin Formula  Keri Original Lotion  Keri Skin Renewal Lotion Keri Silky Smooth Lotion  Keri Silky Smooth Sensitive Skin Lotion  Nivea Body Creamy Conditioning Oil  Nivea Body Extra Enriched Lotion  Nivea Body Original Lotion   Nivea Body Sheer Moisturizing Lotion Nivea Crme  Nivea Skin Firming Lotion  NutraDerm 30 Skin Lotion  NutraDerm Skin Lotion  NutraDerm Therapeutic Skin Cream  NutraDerm Therapeutic Skin Lotion  ProShield Protective Hand Cream  Provon moisturizing lotion  Please read over the following fact sheets that you were given.

## 2023-01-19 ENCOUNTER — Encounter (HOSPITAL_COMMUNITY): Payer: Self-pay

## 2023-01-19 ENCOUNTER — Other Ambulatory Visit: Payer: Self-pay

## 2023-01-19 ENCOUNTER — Encounter (HOSPITAL_COMMUNITY)
Admission: RE | Admit: 2023-01-19 | Discharge: 2023-01-19 | Disposition: A | Discharge status: Home / Self Care | Payer: Medicare HMO | Source: Ambulatory Visit | Attending: Orthopedic Surgery | Admitting: Orthopedic Surgery

## 2023-01-19 VITALS — BP 162/96 | HR 72 | Temp 97.6°F | Resp 17 | Ht 72.0 in | Wt 168.9 lb

## 2023-01-19 DIAGNOSIS — Z01812 Encounter for preprocedural laboratory examination: Secondary | ICD-10-CM | POA: Diagnosis present

## 2023-01-19 DIAGNOSIS — Z01818 Encounter for other preprocedural examination: Secondary | ICD-10-CM | POA: Diagnosis not present

## 2023-01-19 DIAGNOSIS — R9431 Abnormal electrocardiogram [ECG] [EKG]: Secondary | ICD-10-CM | POA: Insufficient documentation

## 2023-01-19 DIAGNOSIS — Z01810 Encounter for preprocedural cardiovascular examination: Secondary | ICD-10-CM | POA: Diagnosis present

## 2023-01-19 HISTORY — DX: Major laceration of unspecified kidney, initial encounter: S37.069A

## 2023-01-19 LAB — BASIC METABOLIC PANEL
Anion gap: 8 (ref 5–15)
BUN: 13 mg/dL (ref 8–23)
CO2: 27 mmol/L (ref 22–32)
Calcium: 9.3 mg/dL (ref 8.9–10.3)
Chloride: 106 mmol/L (ref 98–111)
Creatinine, Ser: 1.33 mg/dL — ABNORMAL HIGH (ref 0.61–1.24)
GFR, Estimated: 52 mL/min — ABNORMAL LOW (ref >60)
Glucose, Bld: 94 mg/dL (ref 70–99)
Potassium: 4.1 mmol/L (ref 3.5–5.1)
Sodium: 141 mmol/L (ref 135–145)

## 2023-01-19 LAB — CBC
HCT: 46 % (ref 39.0–52.0)
Hemoglobin: 14.8 g/dL (ref 13.0–17.0)
MCH: 29 pg (ref 26.0–34.0)
MCHC: 32.2 g/dL (ref 30.0–36.0)
MCV: 90 fL (ref 80.0–100.0)
Platelets: 130 10*3/uL — ABNORMAL LOW (ref 150–400)
RBC: 5.11 MIL/uL (ref 4.22–5.81)
RDW: 13.7 % (ref 11.5–15.5)
WBC: 6.1 10*3/uL (ref 4.0–10.5)
nRBC: 0 % (ref 0.0–0.2)

## 2023-01-19 NOTE — Progress Notes (Signed)
Surgical Instructions   Your procedure is scheduled on Tuesday, November 26th, 2024. Report to [facility] Main Entrance "A" at 5:30 A.M., then check in with the Admitting office. Any questions or running late day of surgery: call [phone]  Questions prior to your surgery date: call [phone], Monday-Friday, 8am-4pm. If you experience any cold or flu symptoms such as cough, fever, chills, shortness of breath, etc. between now and your scheduled surgery, please notify us at the above number.     Remember:  Do not eat after midnight the night before your surgery   You may drink clear liquids until 4:30 the morning of your surgery.   Clear liquids allowed are: Water, Non-Citrus Juices (without pulp), Carbonated Beverages, Clear Tea (no milk, honey, etc.), Black Coffee Only (NO MILK, CREAM OR POWDERED CREAMER of any kind), and Gatorade.  Patient Instructions  The night before surgery:  No food after midnight. ONLY clear liquids after midnight  The day of surgery (if you do NOT have diabetes):  Drink ONE (1) Pre-Surgery Clear Ensure by 4:30 the morning of surgery. Drink in one sitting. Do not sip.  This drink was given to you during your hospital  pre-op appointment visit.  Nothing else to drink after completing the  Pre-Surgery Clear Ensure.          If you have questions, please contact your surgeon's office.     Take these medicines the morning of surgery with A SIP OF WATER: Atorvastatin (Lipitor) Famotidine (Pepcid) Nebivolol (Bystolic) Omeprazole (Prilosec) Tamsulosin (Flomax) Ketorolac (Acular) eye drops Systane Eye Drops Prednisolone acetate (Prep Delainy Mcelhiney) eye drops Tiotropium Bromide-Olodaterol Inhaler   May take these medicines IF NEEDED: Acetaminophen (Tylenol)    One week prior to surgery, STOP taking any Aspirin (unless otherwise instructed by your surgeon) Aleve, Naproxen, Ibuprofen, Motrin, Advil, Goody's, BC's, all herbal medications, fish oil, and  non-prescription vitamins.                     Do NOT Smoke (Tobacco/Vaping) for 24 hours prior to your procedure.  If you use a CPAP at night, you may bring your mask/headgear for your overnight stay.   You will be asked to remove any contacts, glasses, piercing's, hearing aid's, dentures/partials prior to surgery. Please bring cases for these items if needed.    Patients discharged the day of surgery will not be allowed to drive home, and someone needs to stay with them for 24 hours.  SURGICAL WAITING ROOM VISITATION Patients may have no more than 2 support people in the waiting area - these visitors may rotate.   Pre-op nurse will coordinate an appropriate time for 1 ADULT support person, who may not rotate, to accompany patient in pre-op.  Children under the age of 46 must have an adult with them who is not the patient and must remain in the main waiting area with an adult.  If the patient needs to stay at the hospital during part of their recovery, the visitor guidelines for inpatient rooms apply.  Please refer to the [facility] website for the visitor guidelines for any additional information.   If you received a COVID test during your pre-op visit  it is requested that you wear a mask when out in public, stay away from anyone that may not be feeling well and notify your surgeon if you develop symptoms. If you have been in contact with anyone that has tested positive in the last 10 days please notify you surgeon.  Pre-operative CHG Bathing Instructions   You can play a key role in reducing the risk of infection after surgery. Your skin needs to be as free of germs as possible. You can reduce the number of germs on your skin by washing with CHG (chlorhexidine gluconate) soap before surgery. CHG is an antiseptic soap that kills germs and continues to kill germs even after washing.   DO NOT use if you have an allergy to chlorhexidine/CHG or antibacterial soaps. If your skin  becomes reddened or irritated, stop using the CHG and notify one of our RNs at [phone].              TAKE A SHOWER THE NIGHT BEFORE SURGERY AND THE DAY OF SURGERY    Please keep in mind the following:  DO NOT shave, including legs and underarms, 48 hours prior to surgery.   You may shave your face before/day of surgery.  Place clean sheets on your bed the night before surgery Use a clean washcloth (not used since being washed) for each shower. DO NOT sleep with pet's night before surgery.  CHG Shower Instructions:  Wash your face and private area with normal soap. If you choose to wash your hair, wash first with your normal shampoo.  After you use shampoo/soap, rinse your hair and body thoroughly to remove shampoo/soap residue.  Turn the water OFF and apply half the bottle of CHG soap to a CLEAN washcloth.  Apply CHG soap ONLY FROM YOUR NECK DOWN TO YOUR TOES (washing for 3-5 minutes)  DO NOT use CHG soap on face, private areas, open wounds, or sores.  Pay special attention to the area where your surgery is being performed.  If you are having back surgery, having someone wash your back for you may be helpful. Wait 2 minutes after CHG soap is applied, then you may rinse off the CHG soap.  Pat dry with a clean towel  Put on clean pajamas    Additional instructions for the day of surgery: DO NOT APPLY any lotions, deodorants, cologne, or perfumes.   Do not wear jewelry or makeup Do not wear nail polish, gel polish, artificial nails, or any other type of covering on natural nails (fingers and toes) Do not bring valuables to the hospital. [facility] is not responsible for valuables/personal belongings. Put on clean/comfortable clothes.  Please brush your teeth.  Ask your nurse before applying any prescription medications to the skin.

## 2023-01-19 NOTE — Progress Notes (Signed)
PCP - Dr. Martha Clan Cardiologist - denies  PPM/ICD - denies Device Orders - n/a Rep Notified - n/a  Chest x-ray - 06/2020 EKG - 01/19/23 Stress Test - denies ECHO - denies Cardiac Cath - denies  Sleep Study - denies CPAP - n/a  Fasting Blood Sugar - n/a   Blood Thinner Instructions: n/a Aspirin Instructions: pt will continue 81 mg Aspirin  ERAS Protcol - clears until 0430 PRE-SURGERY Ensure or G2-  Ensure   COVID TEST- n/a   Anesthesia review: yes - new onset of A fib;   Patient denies shortness of breath, fever, cough and chest pain at PAT appointment   All instructions explained to the patient, with a verbal understanding of the material. Patient agrees to go over the instructions while at home for a better understanding. Patient also instructed to self quarantine after being tested for COVID-19. The opportunity to ask questions was provided.    Patient denies chest pain and shortness of breath at this time.  Patient and wife educated that patient needs to be seen immediately if he becomes short of breath, has chest pain or worsening symptoms. Patient verbalizes understanding.

## 2023-01-20 ENCOUNTER — Ambulatory Visit: Payer: Medicare HMO | Attending: Cardiology | Admitting: Cardiology

## 2023-01-20 VITALS — BP 148/78 | HR 76 | Ht 72.0 in | Wt 167.0 lb

## 2023-01-20 DIAGNOSIS — I6523 Occlusion and stenosis of bilateral carotid arteries: Secondary | ICD-10-CM

## 2023-01-20 DIAGNOSIS — Z01818 Encounter for other preprocedural examination: Secondary | ICD-10-CM | POA: Diagnosis not present

## 2023-01-20 DIAGNOSIS — I4891 Unspecified atrial fibrillation: Secondary | ICD-10-CM | POA: Diagnosis not present

## 2023-01-20 MED ORDER — APIXABAN 5 MG PO TABS: 5.0000 mg | ORAL_TABLET | Freq: Two times a day (BID) | ORAL | 3 refills | Status: AC

## 2023-01-20 NOTE — Anesthesia Preprocedure Evaluation (Signed)
Anesthesia Evaluation  Patient identified by MRN, date of birth, ID band Patient awake    Reviewed: Allergy & Precautions, H&P , NPO status , Patient's Chart, lab work & pertinent test results  History of Anesthesia Complications (+) PONV and history of anesthetic complications  Airway Mallampati: II  TM Distance: >3 FB Neck ROM: Full    Dental no notable dental hx.    Pulmonary COPD, former smoker   Pulmonary exam normal breath sounds clear to auscultation       Cardiovascular hypertension, Normal cardiovascular exam Rhythm:Regular Rate:Normal     Neuro/Psych CVA  negative psych ROS   GI/Hepatic Neg liver ROS,GERD  ,,  Endo/Other  negative endocrine ROS    Renal/GU Renal disease  negative genitourinary   Musculoskeletal  (+) Arthritis ,    Abdominal   Peds negative pediatric ROS (+)  Hematology negative hematology ROS (+)   Anesthesia Other Findings   Reproductive/Obstetrics negative OB ROS                             Anesthesia Physical Anesthesia Plan  ASA: 3  Anesthesia Plan: Regional   Post-op Pain Management: Regional block*   Induction: Intravenous  PONV Risk Score and Plan: 2 and TIVA  Airway Management Planned: Natural Airway and Simple Face Mask  Additional Equipment:   Intra-op Plan:   Post-operative Plan: Extubation in OR  Informed Consent: I have reviewed the patients History and Physical, chart, labs and discussed the procedure including the risks, benefits and alternatives for the proposed anesthesia with the patient or authorized representative who has indicated his/her understanding and acceptance.     Dental advisory given  Plan Discussed with: CRNA  Anesthesia Plan Comments: (PAT note by Antionette Poles, PA-C: 88 year old male with pertinent history including HTN, former smoker with associated COPD (quit 1993), severe postoperative nausea and vomiting,  GERD, TIA/CVA (2011), carotid disease s/p left endarterectomy 2013 (bilateral 1 to 39% stenosis on duplex 01/06/2023).  I evaluated patient at PAT appointment he reports of mild cough.  He reports for the last 2 to 3 days he has a feeling of irritation in his upper throat causing a mild dry cough.  He denies any other signs or symptoms of illness, no fevers, no shortness of breath.  Denies any sick contacts.  He is using OTC cough suppressant. On exam he is well-appearing, in no acute distress.  Lungs are clear to auscultation bilaterally.  He did not cough throughout my interview with him.  Anticipate as long as he remains stable or improving he will be able to proceed as planned.  Low suspicion for URI.  Preop EKG notable for rate controlled A-fib.  This appears to be a new diagnosis.  Patient denies any history of atrial fibrillation, denies any palpitations, chest pain, shortness of breath. He is still quite activite at 88 years old, denies any recent decline in his functional status. He has no awareness of being in A-fib.  Discussed with patient that he will need cardiology evaluation prior to undergoing elective surgery and that he will likely need discussion of starting anticoagulation following surgery especially in light of his history of prior CVA.  He does currently take an 81 mg ASA daily.   Preop labs reviewed, creatinine mildly elevated at 1.33, mild thrombocytopenia with platelets 130, otherwise unremarkable.  Pt had preop eval with cardiologist Dr. Mayford Knife 01/20/23. 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  Plan was for surgical excision -I do not think that new onset atrial fibrillation precludes proceeding with surgical excision of his intramuscular lipoma. -Would recommend starting Eliquis 5 mg twice daily after his surgery next Tuesday if ok with Surgery -His perioperative risk of a  major cardiac event is0.4% according to the Revised Cardiac Risk Index (RCRI). Therefore, the patientis at [facility] for perioperative complications. The patient's functional capacity is good at 5.62 METs according to the Duke Activity Status Index (DASI). Recommendations: According to ACC/AHA guidelines, no further cardiovascular testing needed. The patient may proceed to surgery at acceptable risk."   Carotid duplex 01/06/2023: Summary:  Right Carotid: Velocities in the right ICA are consistent with a 1-39% stenosis.  Left Carotid: Velocities in the left ICA are consistent with a 1-39% stenosis.  Vertebrals:Bilateral vertebral arteries demonstrate antegrade flow.  Subclavians: Normal flow hemodynamics were seen in bilateral subclavian arteries.    )        Anesthesia Quick Evaluation

## 2023-01-20 NOTE — Progress Notes (Addendum)
Cardiology CONSULT Note    Date:  01/20/2023   ID:  Robert Bowers, DOB 27-May-1934, MRN [id number]  PCP:  Cleatis Polka., MD  Cardiologist:  Armanda Magic, MD   Chief Complaint  Patient presents with   New Patient (Initial Visit)    New onset atrial fibrillation and preoperative clearance    Patient Profile: Robert Bowers is a 88 y.o. male who is being seen today for the evaluation of preoperative clearance for new onset atrial fibrillation at the request of Cleatis Polka., MD.  History of Present Illness:  Robert Bowers is a 88 y.o. male who is being seen today for the evaluation of preoperative clearance and new onset atrial fibrillation at the request of Cleatis Polka., MD.  This is an 88 year old male with a history of carotid artery stenosis status post left CEA 2013 with 1 to 39% stenosis on duplex 01/06/2023, COPD, GERD, hyperlipidemia, hypertension, chronic shortness of breath related to COPD and prior CVA.  He was seen on 01/19/2019 for by Antionette Poles, PA for preoperative clearance anesthesia for orthopedic surgery.  He was found to be in atrial fibrillation with controlled ventricular response at that time.    He has no history of atrial fibrillation.  He denies any chest pain or pressure, PND, orthopnea, lower extremity edema, palpitations, dizziness or syncope.  He has no awareness of his atrial fibrillation.  He has chronic DOE related to COPD which is stable on inhalers. CHA2DS2-VASC score is 51 (age greater than 14, hypertension, CVA, vascular disease).  Currently not on anticoagulation.  Past Medical History:  Diagnosis Date   Arthritis    Bilateral Hands   Carotid artery occlusion    Complication of anesthesia    difficulty awakening from anesthesia, & N & V   COPD (chronic obstructive pulmonary disease) (HCC)    GERD (gastroesophageal reflux disease)    Hyperlipidemia    Hypertension    PONV (postoperative nausea and vomiting)    Recurrent upper  respiratory infection (URI)    last bout of bronchitis 04/2011.   Rupture of kidney    Shortness of breath    at times due to COPD.   Stroke [facility])    no deficits.    Past Surgical History:  Procedure Laterality Date   APPENDECTOMY     ELBOW SURGERY Left    x3   ENDARTERECTOMY  06/29/2011   Procedure: ENDARTERECTOMY CAROTID;  Surgeon: Pryor Ochoa, MD;  Location: [facility] OR;  Service: Vascular;  Laterality: Left;   EYE SURGERY  Aug. and Dec. 2012   Detatched retina and Macular Deg.   EYE SURGERY Bilateral 03/31/2013   Eyelid / Eyebrow   INGUINAL HERNIA REPAIR Right 08/17/2020   Procedure: LAPAROSCOPIC RIGHT INGUINAL HERNIA REPAIR;  Surgeon: Axel Filler, MD;  Location: [facility] OR;  Service: General;  Laterality: Right;   INSERTION OF MESH  08/17/2020   Procedure: INSERTION OF MESH;  Surgeon: Axel Filler, MD;  Location: [facility] OR;  Service: General;;   KIDNEY SURGERY     SHOULDER SURGERY Left     Current Medications: Current Meds  Medication Sig   acetaminophen (TYLENOL) 500 MG tablet Take 500 mg by mouth every 6 (six) hours as needed for moderate pain.   aspirin 81 MG tablet Take 81 mg by mouth daily.   atorvastatin (LIPITOR) 40 MG tablet Take 40 mg by mouth daily.   calcium carbonate (TUMS - DOSED IN MG  ELEMENTAL CALCIUM) 500 MG chewable tablet Chew 1,000 mg by mouth daily as needed for indigestion or heartburn.   clobetasol cream (TEMOVATE) 0.05 % Apply 1 application  topically daily as needed (itching).   DUPIXENT 300 MG/2ML SOAJ Inject 300 mg into the skin every 14 (fourteen) days.   famotidine (PEPCID) 20 MG tablet Take 1 tablet (20 mg total) by mouth 2 (two) times daily.   ketorolac (ACULAR) 0.5 % ophthalmic solution Place 1 drop into the right eye in the morning, at noon, and at bedtime.   nebivolol (BYSTOLIC) 5 MG tablet Take 5 mg by mouth daily.   omeprazole (PRILOSEC) 40 MG capsule TAKE 1 CAPSULE (40 MG TOTAL) BY MOUTH DAILY.   Polyethyl Glyc-Propyl Glyc PF (SYSTANE  HYDRATION PF) 0.4-0.3 % SOLN Place 1 drop into both eyes daily.   prednisoLONE acetate (PRED FORTE) 1 % ophthalmic suspension Place 1 drop into the right eye in the morning, at noon, and at bedtime.   tamsulosin (FLOMAX) 0.4 MG CAPS capsule Take 0.4 mg by mouth in the morning and at bedtime.   Tiotropium Bromide-Olodaterol (STIOLTO RESPIMAT) 2.5-2.5 MCG/ACT AERS Inhale 2 puffs into the lungs daily.    Allergies:   Vicodin [hydrocodone-acetaminophen], Ace inhibitors, Latex, and Tape   Social History   Socioeconomic History   Marital status: Married    Spouse name: Scarlette Calico   Number of children: 1   Years of education: Not on file   Highest education level: Not on file  Occupational History   Occupation: retired  Tobacco Use   Smoking status: Former    Current packs/day: 0.00    Average packs/day: 1 pack/day for 35.0 years (35.0 ttl pk-yrs)    Types: Cigarettes    Start date: 06/02/1956    Quit date: 06/03/1991    Years since quitting: 31.6   Smokeless tobacco: Never  Vaping Use   Vaping status: Never Used  Substance and Sexual Activity   Alcohol use: Yes    Alcohol/week: 12.0 standard drinks of alcohol    Types: 12 Cans of beer per week    Comment: Drinks a beer daily   Drug use: No   Sexual activity: Not on file  Other Topics Concern   Not on file  Social History Narrative   Not on file   Social Determinants of Health   Financial Resource Strain: Not on file  Food Insecurity: Not on file  Transportation Needs: Not on file  Physical Activity: Not on file  Stress: Not on file  Social Connections: Unknown (07/13/2021)   Received [facility]   Social Network    Social Network: Not on file     Family History:  The patient's family history includes Heart attack in his mother; Heart disease in his mother; Hyperlipidemia in his mother; Hypertension in his mother; Kidney disease in his father; Varicose Veins in his mother.   ROS:   Please see the  history of present illness.    ROS All other systems reviewed and are negative.      No data to display             PHYSICAL EXAM:   VS:  BP (!) 148/78   Pulse 76   Ht 6' (1.829 m)   Wt 167 lb (75.8 kg)   BMI 22.65 kg/m    GEN: Well nourished, well developed, in no acute distress  HEENT: normal  Neck: no JVD, carotid bruits, or masses Cardiac: irregularly irregular; no murmurs,  rubs, or gallops,no edema.  Intact distal pulses bilaterally.  Respiratory:  clear to auscultation bilaterally, normal work of breathing GI: soft, nontender, nondistended, + BS MS: no deformity or atrophy  Skin: warm and dry, no rash Neuro:  Alert and Oriented x 3, Strength and sensation are intact Psych: euthymic mood, full affect  Wt Readings from Last 3 Encounters:  01/20/23 167 lb (75.8 kg)  01/19/23 168 lb 14.4 oz (76.6 kg)  08/10/21 165 lb 9.6 oz (75.1 kg)      Studies/Labs Reviewed:           Recent Labs: 01/19/2023: BUN 13; Creatinine, Ser 1.33; Hemoglobin 14.8; Platelets 130; Potassium 4.1; Sodium 141   Lipid Panel No results found for: "CHOL", "TRIG", "HDL", "CHOLHDL", "VLDL", "LDLCALC", "LDLDIRECT"  ASSESSMENT:    1. New onset atrial fibrillation (HCC)   2. Preoperative clearance   3. Bilateral carotid artery stenosis      PLAN:  In order of problems listed above:  New onset atrial fibrillation -Duration unknown -He is completely asymptomatic and unaware of his irregular heartbeat -Heart rate adequately controlled -Continue Bystolic 5 mg daily for rate control. 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  Plan was for surgical excision -I do not think that new onset atrial fibrillation precludes proceeding with surgical excision of his intramuscular lipoma. -Would recommend starting Eliquis 5 mg twice daily after his surgery next Tuesday if ok with Surgery -His perioperative risk of a major cardiac event is 0.4% according to the Revised Cardiac Risk Index (RCRI).  Therefore, the patient is at low risk for perioperative complications.   The patient's  functional capacity is good at 5.62 METs according to the Duke Activity Status Index (DASI). Recommendations: According to ACC/AHA guidelines, no further cardiovascular testing needed.  The patient may proceed to surgery at acceptable risk.    Bilateral carotid artery stenosis -Carotid Dopplers done 01/18/2023 demonstrated 1 to 39% bilateral carotid stenosis -Okay to stop aspirin since we are starting Eliquis -Continue statin therapy  Hyperlipidemia -LDL <70 due to vascular disease -Continue prescription drug management with atorvastatin 40 mg daily -Check FLP and ALT   Time Spent: 20 minutes total time of encounter, including 15 minutes spent in face-to-face patient care on the date of this encounter. This time includes coordination of care and counseling regarding above mentioned problem list. Remainder of non-face-to-face time involved reviewing chart documents/testing relevant to the patient encounter and documentation in the medical record. I have independently reviewed documentation from referring provider  Followup:  3 months  Medication Adjustments/Labs and Tests Ordered: Current medicines are reviewed at length with the patient today.  Concerns regarding medicines are outlined above.  Medication changes, Labs and Tests ordered today are listed in the Patient Instructions below.  There are no Patient Instructions on file for this visit.   Signed, Armanda Magic,  MD  01/20/2023 2:05 PM    [facility] [address] [place], [place], [place]  [address] Phone: [phone]; Fax: [phone]

## 2023-01-20 NOTE — Progress Notes (Signed)
Anesthesia Chart Review:  88 year old male with pertinent history including HTN, former smoker with associated COPD (quit 1993), severe postoperative nausea and vomiting, GERD, TIA/CVA (2011), carotid disease s/p left endarterectomy 2013 (bilateral 1 to 39% stenosis on duplex 01/06/2023).  I evaluated patient at PAT appointment he reports of mild cough.  He reports for the last 2 to 3 days he has a feeling of irritation in his upper throat causing a mild dry cough.  He denies any other signs or symptoms of illness, no fevers, no shortness of breath.  Denies any sick contacts.  He is using OTC cough suppressant. On exam he is well-appearing, in no acute distress.  Lungs are clear to auscultation bilaterally.  He did not cough throughout my interview with him.  Anticipate as long as he remains stable or improving he will be able to proceed as planned.  Low suspicion for URI.  Preop EKG notable for rate controlled A-fib.  This appears to be a new diagnosis.  Patient denies any history of atrial fibrillation, denies any palpitations, chest pain, shortness of breath. He is still quite activite at 88 years old, denies any recent decline in his functional status. He has no awareness of being in A-fib.  Discussed with patient that he will need cardiology evaluation prior to undergoing elective surgery and that he will likely need discussion of starting anticoagulation following surgery especially in light of his history of prior CVA.  He does currently take an 81 mg ASA daily.   Preop labs reviewed, creatinine mildly elevated at 1.33, mild thrombocytopenia with platelets 130, otherwise unremarkable.  Pt had preop eval with cardiologist Dr. Mayford Knife 01/20/23. 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  Plan was for surgical excision -I do not think that new onset atrial fibrillation precludes proceeding with  surgical excision of his intramuscular lipoma. -Would recommend starting Eliquis 5 mg twice daily after his surgery next Tuesday if ok with Surgery -His perioperative risk of a major cardiac event is 0.4% according to the Revised Cardiac Risk Index (RCRI).  Therefore, the patient is at low risk for perioperative complications.   The patient's  functional capacity is good at 5.62 METs according to the Duke Activity Status Index (DASI). Recommendations: According to ACC/AHA guidelines, no further cardiovascular testing needed.  The patient may proceed to surgery at acceptable risk."   Carotid duplex 01/06/2023: Summary:  Right Carotid: Velocities in the right ICA are consistent with a 1-39% stenosis.  Left Carotid: Velocities in the left ICA are consistent with a 1-39% stenosis.  Vertebrals: Bilateral vertebral arteries demonstrate antegrade flow.  Subclavians: Normal flow hemodynamics were seen in bilateral subclavian arteries.      Zannie Cove [facility] Short Stay Center/Anesthesiology Phone [phone] 01/20/2023 2:44 PM

## 2023-01-20 NOTE — Patient Instructions (Addendum)
Medication Instructions:  Please start eliquis 5 mg twice a day with a meal once your surgery has been performed and your surgeon has advised you what day to start.  Please stop taking your aspirin at this time.  *If you need a refill on your cardiac medications before your next appointment, please call your pharmacy*   Lab Work: Please return to our office when you have been fasting in order to complete a lipid panel and an ALT.  If you have labs (blood work) drawn today and your tests are completely normal, you will receive your results only by: MyChart Message (if you have MyChart) OR A paper copy in the mail If you have any lab test that is abnormal or we need to change your treatment, we will call you to review the results.   Testing/Procedures: Your physician has requested that you have an echocardiogram. Echocardiography is a painless test that uses sound waves to create images of your heart. It provides your doctor with information about the size and shape of your heart and how well your heart's chambers and valves are working. This procedure takes approximately one hour. There are no restrictions for this procedure. Please do NOT wear cologne, perfume, aftershave, or lotions (deodorant is allowed). Please arrive 15 minutes prior to your appointment time.  Please note: We ask at that you not bring children with you during ultrasound (echo/ vascular) testing. Due to room size and safety concerns, children are not allowed in the ultrasound rooms during exams. Our front office staff cannot provide observation of children in our lobby area while testing is being conducted. An adult accompanying a patient to their appointment will only be allowed in the ultrasound room at the discretion of the ultrasound technician under special circumstances. We apologize for any inconvenience.     Follow-Up: At [facility], you and your health needs are our priority.  As part of our  continuing mission to provide you with exceptional heart care, we have created designated Provider Care Teams.  These Care Teams include your primary Cardiologist (physician) and Advanced Practice Providers (APPs -  Physician Assistants and Nurse Practitioners) who all work together to provide you with the care you need, when you need it.  We recommend signing up for the patient portal called "MyChart".  Sign up information is provided on this After Visit Summary.  MyChart is used to connect with patients for Virtual Visits (Telemedicine).  Patients are able to view lab/test results, encounter notes, upcoming appointments, etc.  Non-urgent messages can be sent to your provider as well.   To learn more about what you can do with MyChart, go to [email or web address].    Your next appointment:   3 month(s)  Provider:   Dr. Armanda Magic, MD   Other Instructions Please make an appointment to be seen at our AFIB CLINIC for follow up.

## 2023-01-20 NOTE — Addendum Note (Signed)
Addended by: Luellen Pucker on: 01/20/2023 02:20 PM   Modules accepted: Orders

## 2023-01-20 NOTE — Progress Notes (Signed)
Thanks very much.

## 2023-01-24 ENCOUNTER — Ambulatory Visit (HOSPITAL_COMMUNITY): Payer: Medicare HMO | Admitting: Physician Assistant

## 2023-01-24 ENCOUNTER — Encounter (HOSPITAL_COMMUNITY)
Admission: RE | Disposition: A | Discharge status: Home / Self Care | Payer: Self-pay | Source: Home / Self Care | Attending: Orthopedic Surgery

## 2023-01-24 ENCOUNTER — Ambulatory Visit (HOSPITAL_COMMUNITY)
Admission: RE | Admit: 2023-01-24 | Discharge: 2023-01-24 | Disposition: A | Discharge status: Home / Self Care | Payer: Medicare HMO | Attending: Orthopedic Surgery | Admitting: Orthopedic Surgery

## 2023-01-24 ENCOUNTER — Other Ambulatory Visit: Payer: Self-pay

## 2023-01-24 ENCOUNTER — Encounter (HOSPITAL_COMMUNITY): Payer: Self-pay | Admitting: Orthopedic Surgery

## 2023-01-24 ENCOUNTER — Ambulatory Visit (HOSPITAL_BASED_OUTPATIENT_CLINIC_OR_DEPARTMENT_OTHER): Payer: Self-pay | Admitting: Anesthesiology

## 2023-01-24 DIAGNOSIS — Z79899 Other long term (current) drug therapy: Secondary | ICD-10-CM | POA: Diagnosis not present

## 2023-01-24 DIAGNOSIS — M1990 Unspecified osteoarthritis, unspecified site: Secondary | ICD-10-CM | POA: Insufficient documentation

## 2023-01-24 DIAGNOSIS — R2232 Localized swelling, mass and lump, left upper limb: Secondary | ICD-10-CM

## 2023-01-24 DIAGNOSIS — D1722 Benign lipomatous neoplasm of skin and subcutaneous tissue of left arm: Secondary | ICD-10-CM

## 2023-01-24 DIAGNOSIS — Z01818 Encounter for other preprocedural examination: Secondary | ICD-10-CM

## 2023-01-24 DIAGNOSIS — Z7982 Long term (current) use of aspirin: Secondary | ICD-10-CM | POA: Insufficient documentation

## 2023-01-24 DIAGNOSIS — M2550 Pain in unspecified joint: Secondary | ICD-10-CM | POA: Diagnosis not present

## 2023-01-24 DIAGNOSIS — E785 Hyperlipidemia, unspecified: Secondary | ICD-10-CM | POA: Insufficient documentation

## 2023-01-24 DIAGNOSIS — Z87891 Personal history of nicotine dependence: Secondary | ICD-10-CM | POA: Insufficient documentation

## 2023-01-24 DIAGNOSIS — J449 Chronic obstructive pulmonary disease, unspecified: Secondary | ICD-10-CM | POA: Diagnosis not present

## 2023-01-24 DIAGNOSIS — Z7901 Long term (current) use of anticoagulants: Secondary | ICD-10-CM | POA: Insufficient documentation

## 2023-01-24 DIAGNOSIS — I10 Essential (primary) hypertension: Secondary | ICD-10-CM | POA: Diagnosis not present

## 2023-01-24 DIAGNOSIS — K219 Gastro-esophageal reflux disease without esophagitis: Secondary | ICD-10-CM | POA: Insufficient documentation

## 2023-01-24 HISTORY — PX: LIPOMA EXCISION: SHX5283

## 2023-01-24 SURGERY — EXCISION LIPOMA
Anesthesia: Regional | Site: Elbow | Laterality: Left

## 2023-01-24 MED ORDER — PHENYLEPHRINE 80 MCG/ML (10ML) SYRINGE FOR IV PUSH (FOR BLOOD PRESSURE SUPPORT)
PREFILLED_SYRINGE | INTRAVENOUS | Status: AC
  Filled 2023-01-24: qty 10

## 2023-01-24 MED ORDER — FENTANYL CITRATE (PF) 250 MCG/5ML IJ SOLN
INTRAMUSCULAR | Status: DC | PRN
  Administered 2023-01-24 (×2): 50 ug via INTRAVENOUS

## 2023-01-24 MED ORDER — BUPIVACAINE HCL (PF) 0.25 % IJ SOLN
INTRAMUSCULAR | Status: DC | PRN
  Administered 2023-01-24: 20 mL

## 2023-01-24 MED ORDER — TRAMADOL HCL 50 MG PO TABS: 50.0000 mg | ORAL_TABLET | Freq: Four times a day (QID) | ORAL | 0 refills | Status: DC | PRN

## 2023-01-24 MED ORDER — OXYCODONE HCL 5 MG PO TABS: 5.0000 mg | ORAL_TABLET | Freq: Once | ORAL | Status: DC | PRN

## 2023-01-24 MED ORDER — POVIDONE-IODINE 7.5 % EX SOLN
Freq: Once | CUTANEOUS | Status: DC
  Filled 2023-01-24: qty 118

## 2023-01-24 MED ORDER — 0.9 % SODIUM CHLORIDE (POUR BTL) OPTIME
TOPICAL | Status: DC | PRN
  Administered 2023-01-24: 1000 mL

## 2023-01-24 MED ORDER — ONDANSETRON HCL 4 MG/2ML IJ SOLN
INTRAMUSCULAR | Status: DC | PRN
  Administered 2023-01-24: 4 mg via INTRAVENOUS

## 2023-01-24 MED ORDER — EPHEDRINE SULFATE-NACL 50-0.9 MG/10ML-% IV SOSY
PREFILLED_SYRINGE | INTRAVENOUS | Status: DC | PRN
  Administered 2023-01-24: 10 mg via INTRAVENOUS
  Administered 2023-01-24 (×4): 5 mg via INTRAVENOUS

## 2023-01-24 MED ORDER — CHLORHEXIDINE GLUCONATE 0.12 % MT SOLN: 15.0000 mL | Freq: Once | OROMUCOSAL | Status: AC

## 2023-01-24 MED ORDER — ALBUMIN HUMAN 5 % IV SOLN: INTRAVENOUS | Status: DC | PRN

## 2023-01-24 MED ORDER — BUPIVACAINE HCL (PF) 0.25 % IJ SOLN
INTRAMUSCULAR | Status: AC
  Filled 2023-01-24: qty 30

## 2023-01-24 MED ORDER — PROPOFOL 10 MG/ML IV BOLUS
INTRAVENOUS | Status: DC | PRN
  Administered 2023-01-24: 120 mg via INTRAVENOUS
  Administered 2023-01-24 (×2): 50 mg via INTRAVENOUS
  Administered 2023-01-24: 30 mg via INTRAVENOUS

## 2023-01-24 MED ORDER — PROPOFOL 10 MG/ML IV BOLUS
INTRAVENOUS | Status: AC
  Filled 2023-01-24: qty 20

## 2023-01-24 MED ORDER — POVIDONE-IODINE 10 % EX SWAB
2.0000 | Freq: Once | CUTANEOUS | Status: AC
  Administered 2023-01-24: 2 via TOPICAL

## 2023-01-24 MED ORDER — LIDOCAINE 2% (20 MG/ML) 5 ML SYRINGE
INTRAMUSCULAR | Status: AC
  Filled 2023-01-24: qty 5

## 2023-01-24 MED ORDER — CHLORHEXIDINE GLUCONATE 0.12 % MT SOLN
OROMUCOSAL | Status: AC
  Administered 2023-01-24: 15 mL via OROMUCOSAL
  Filled 2023-01-24: qty 15

## 2023-01-24 MED ORDER — DEXAMETHASONE SODIUM PHOSPHATE 10 MG/ML IJ SOLN
INTRAMUSCULAR | Status: AC
  Filled 2023-01-24: qty 1

## 2023-01-24 MED ORDER — LACTATED RINGERS IV SOLN: INTRAVENOUS | Status: DC

## 2023-01-24 MED ORDER — PHENYLEPHRINE HCL-NACL 20-0.9 MG/250ML-% IV SOLN
INTRAVENOUS | Status: DC | PRN
  Administered 2023-01-24: 40 ug/min via INTRAVENOUS

## 2023-01-24 MED ORDER — OXYCODONE HCL 5 MG/5ML PO SOLN: 5.0000 mg | Freq: Once | ORAL | Status: DC | PRN

## 2023-01-24 MED ORDER — DEXAMETHASONE SODIUM PHOSPHATE 10 MG/ML IJ SOLN
INTRAMUSCULAR | Status: DC | PRN
  Administered 2023-01-24: 10 mg via INTRAVENOUS

## 2023-01-24 MED ORDER — ONDANSETRON HCL 4 MG/2ML IJ SOLN
INTRAMUSCULAR | Status: AC
  Filled 2023-01-24: qty 2

## 2023-01-24 MED ORDER — ORAL CARE MOUTH RINSE: 15.0000 mL | Freq: Once | OROMUCOSAL | Status: AC

## 2023-01-24 MED ORDER — PROPOFOL 1000 MG/100ML IV EMUL
INTRAVENOUS | Status: AC
  Filled 2023-01-24: qty 100

## 2023-01-24 MED ORDER — DROPERIDOL 2.5 MG/ML IJ SOLN: 0.6250 mg | Freq: Once | INTRAMUSCULAR | Status: DC | PRN

## 2023-01-24 MED ORDER — LIDOCAINE 2% (20 MG/ML) 5 ML SYRINGE
INTRAMUSCULAR | Status: DC | PRN
  Administered 2023-01-24: 100 mg via INTRAVENOUS

## 2023-01-24 MED ORDER — FENTANYL CITRATE (PF) 250 MCG/5ML IJ SOLN
INTRAMUSCULAR | Status: AC
  Filled 2023-01-24: qty 5

## 2023-01-24 MED ORDER — CEFAZOLIN SODIUM-DEXTROSE 2-4 GM/100ML-% IV SOLN
2.0000 g | INTRAVENOUS | Status: AC
  Administered 2023-01-24: 2 g via INTRAVENOUS
  Filled 2023-01-24: qty 100

## 2023-01-24 MED ORDER — PHENYLEPHRINE 80 MCG/ML (10ML) SYRINGE FOR IV PUSH (FOR BLOOD PRESSURE SUPPORT)
PREFILLED_SYRINGE | INTRAVENOUS | Status: DC | PRN
  Administered 2023-01-24: 80 ug via INTRAVENOUS

## 2023-01-24 MED ORDER — EPHEDRINE 5 MG/ML INJ
INTRAVENOUS | Status: AC
  Filled 2023-01-24: qty 10

## 2023-01-24 MED ORDER — FENTANYL CITRATE (PF) 100 MCG/2ML IJ SOLN: 25.0000 ug | INTRAMUSCULAR | Status: DC | PRN

## 2023-01-24 SURGICAL SUPPLY — 55 items
BAG COUNTER SPONGE SURGICOUNT (BAG) ×2 IMPLANT
BENZOIN TINCTURE PRP APPL 2/3 (GAUZE/BANDAGES/DRESSINGS) ×2 IMPLANT
BLADE CLIPPER SURG (BLADE) IMPLANT
BNDG ELASTIC 2X5.8 VLCR STR LF (GAUZE/BANDAGES/DRESSINGS) IMPLANT
BNDG ELASTIC 3INX 5YD STR LF (GAUZE/BANDAGES/DRESSINGS) IMPLANT
BNDG ELASTIC 4INX 5YD STR LF (GAUZE/BANDAGES/DRESSINGS) IMPLANT
BNDG ELASTIC 4X5.8 VLCR STR LF (GAUZE/BANDAGES/DRESSINGS) IMPLANT
BNDG ESMARK 4X9 LF (GAUZE/BANDAGES/DRESSINGS) IMPLANT
BNDG STRETCH GAUZE 3IN X12FT (GAUZE/BANDAGES/DRESSINGS) IMPLANT
CNTNR URN SCR LID CUP LEK RST (MISCELLANEOUS) IMPLANT
CORD BIPOLAR FORCEPS 12FT (ELECTRODE) IMPLANT
COVER SURGICAL LIGHT HANDLE (MISCELLANEOUS) ×2 IMPLANT
CUFF TOURN SGL QUICK 18X4 (TOURNIQUET CUFF) IMPLANT
CUFF TRNQT CYL 24X4X16.5-23 (TOURNIQUET CUFF) IMPLANT
DRAPE INCISE IOBAN 66X45 STRL (DRAPES) IMPLANT
DRAPE U-SHAPE 47X51 STRL (DRAPES) IMPLANT
DRSG EMULSION OIL 3X3 NADH (GAUZE/BANDAGES/DRESSINGS) IMPLANT
DURAPREP 26ML APPLICATOR (WOUND CARE) ×2 IMPLANT
ELECT REM PT RETURN 9FT ADLT (ELECTROSURGICAL)
ELECTRODE REM PT RTRN 9FT ADLT (ELECTROSURGICAL) IMPLANT
GAUZE PAD ABD 8X10 STRL (GAUZE/BANDAGES/DRESSINGS) IMPLANT
GAUZE SPONGE 4X4 12PLY STRL (GAUZE/BANDAGES/DRESSINGS) IMPLANT
GAUZE XEROFORM 1X8 LF (GAUZE/BANDAGES/DRESSINGS) IMPLANT
GLOVE BIOGEL PI IND STRL 8 (GLOVE) ×2 IMPLANT
GOWN STRL REUS W/ TWL LRG LVL3 (GOWN DISPOSABLE) ×4 IMPLANT
KIT BASIN OR (CUSTOM PROCEDURE TRAY) ×2 IMPLANT
KIT TURNOVER KIT B (KITS) ×2 IMPLANT
MANIFOLD NEPTUNE II (INSTRUMENTS) ×2 IMPLANT
NS IRRIG 1000ML POUR BTL (IV SOLUTION) ×2 IMPLANT
PACK ORTHO EXTREMITY (CUSTOM PROCEDURE TRAY) ×2 IMPLANT
PAD ARMBOARD 7.5X6 YLW CONV (MISCELLANEOUS) ×4 IMPLANT
PAD CAST 4YDX4 CTTN HI CHSV (CAST SUPPLIES) IMPLANT
PENCIL BUTTON HOLSTER BLD 10FT (ELECTRODE) IMPLANT
SLING ARM FOAM STRAP XLG (SOFTGOODS) IMPLANT
SPECIMEN JAR SMALL (MISCELLANEOUS) ×2 IMPLANT
SPLINT PLASTER CAST XFAST 5X30 (CAST SUPPLIES) IMPLANT
STRIP CLOSURE SKIN 1/2X4 (GAUZE/BANDAGES/DRESSINGS) ×2 IMPLANT
SUCTION TUBE FRAZIER 10FR DISP (SUCTIONS) IMPLANT
SUT ETHIBOND 4 0 TF (SUTURE) IMPLANT
SUT ETHILON 3 0 PS 1 (SUTURE) IMPLANT
SUT ETHILON 4 0 P 3 18 (SUTURE) IMPLANT
SUT MNCRL AB 3-0 PS2 27 (SUTURE) IMPLANT
SUT NYLON ETHILON 5-0 P-3 1X18 (SUTURE) IMPLANT
SUT POLY ETHIBOND 5-0 P-3 1X18 (SUTURE) IMPLANT
SUT PROLENE 4 0 P 3 18 (SUTURE) IMPLANT
SUT SILK 2-0 18XBRD TIE 12 (SUTURE) IMPLANT
SUT SILK 4 0 PS 2 (SUTURE) IMPLANT
SUT VIC AB 0 CT2 27 (SUTURE) IMPLANT
SUT VIC AB 2-0 CT2 27 (SUTURE) IMPLANT
SUT VIC AB 3-0 FS2 27 (SUTURE) IMPLANT
SUT VIC AB 3-0 SH 27X BRD (SUTURE) IMPLANT
TOWEL GREEN STERILE (TOWEL DISPOSABLE) ×2 IMPLANT
TOWEL GREEN STERILE FF (TOWEL DISPOSABLE) ×2 IMPLANT
TUBE CONNECTING 12X1/4 (SUCTIONS) IMPLANT
WATER STERILE IRR 1000ML POUR (IV SOLUTION) ×2 IMPLANT

## 2023-01-24 NOTE — H&P (Signed)
Robert Bowers is an 88 y.o. male.   Chief Complaint: left elbow mass HPI:  Robert Bowers is a 88 y.o. male who presents to the office reporting left elbow pain.  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  The pain comes and goes.  He feels like he has a shooting pain associated with this mass that goes up and down the arm.  Masses around 3 x 4 cm.  Does have "nerve pain" on a daily basis.  Also has a history of significant injury to that left elbow about 20 years ago and the arthritis present which has been there for a while is not giving him the same pain symptoms that he associates with this mass.  MRI scan of the elbow demonstrates a 3 x 4 cm lipomatous mass within the pronator teres muscle most consistent with a simple intramuscular lipoma with no concerning features..    Past Medical History:  Diagnosis Date   Arthritis    Bilateral Hands   Carotid artery occlusion    Complication of anesthesia    difficulty awakening from anesthesia, & N & V   COPD (chronic obstructive pulmonary disease) (HCC)    GERD (gastroesophageal reflux disease)    Hyperlipidemia    Hypertension    PONV (postoperative nausea and vomiting)    Recurrent upper respiratory infection (URI)    last bout of bronchitis 04/2011.   Rupture of kidney    Shortness of breath    at times due to COPD.   Stroke [name])    no deficits.    Past Surgical History:  Procedure Laterality Date   APPENDECTOMY     ELBOW SURGERY Left    x3   ENDARTERECTOMY  06/29/2011   Procedure: ENDARTERECTOMY CAROTID;  Surgeon: Pryor Ochoa, MD;  Location: [facility] OR;  Service: Vascular;  Laterality: Left;   EYE SURGERY  Aug. and Dec. 2012   Detatched retina and Macular Deg.   EYE SURGERY Bilateral 03/31/2013   Eyelid / Eyebrow   INGUINAL HERNIA REPAIR Right 08/17/2020   Procedure: LAPAROSCOPIC RIGHT INGUINAL HERNIA REPAIR;  Surgeon: Axel Filler, MD;  Location: [facility] OR;  Service: General;  Laterality: Right;    INSERTION OF MESH  08/17/2020   Procedure: INSERTION OF MESH;  Surgeon: Axel Filler, MD;  Location: [facility] OR;  Service: General;;   KIDNEY SURGERY     SHOULDER SURGERY Left     Family History  Problem Relation Age of Onset   Heart disease Mother        Heart Disease before age 80   Hypertension Mother    Hyperlipidemia Mother    Heart attack Mother    Varicose Veins Mother    Kidney disease Father    Anesthesia problems Neg Hx    Colon cancer Neg Hx    Social History:  reports that he quit smoking about 31 years ago. His smoking use included cigarettes. He started smoking about 66 years ago. He has a 35 pack-year smoking history. He has never used smokeless tobacco. He reports current alcohol use of about 12.0 standard drinks of alcohol per week. He reports that he does not use drugs.  Allergies:  Allergies  Allergen Reactions   Vicodin [Hydrocodone-Acetaminophen] Other (See Comments)    Hallucinations.   Ace Inhibitors Cough   Latex Rash   Tape Itching and Rash    Paper Tape is okay.    Medications Prior to Admission  Medication Sig Dispense Refill   acetaminophen (TYLENOL) 500 MG tablet Take 500 mg by mouth every 6 (six) hours as needed for moderate pain.     aspirin 81 MG tablet Take 81 mg by mouth daily.     atorvastatin (LIPITOR) 40 MG tablet Take 40 mg by mouth daily.     calcium carbonate (TUMS - DOSED IN MG ELEMENTAL CALCIUM) 500 MG chewable tablet Chew 1,000 mg by mouth daily as needed for indigestion or heartburn.     clobetasol cream (TEMOVATE) 0.05 % Apply 1 application  topically daily as needed (itching).     DUPIXENT 300 MG/2ML SOAJ Inject 300 mg into the skin every 14 (fourteen) days.     famotidine (PEPCID) 20 MG tablet Take 1 tablet (20 mg total) by mouth 2 (two) times daily. 30 tablet 3   nebivolol (BYSTOLIC) 5 MG tablet Take 5 mg by mouth daily.     omeprazole (PRILOSEC) 40 MG capsule TAKE 1 CAPSULE (40 MG TOTAL) BY MOUTH DAILY. 90 capsule 0   Polyethyl  Glyc-Propyl Glyc PF (SYSTANE HYDRATION PF) 0.4-0.3 % SOLN Place 1 drop into both eyes daily.     prednisoLONE acetate (PRED FORTE) 1 % ophthalmic suspension Place 1 drop into the right eye in the morning, at noon, and at bedtime.     tamsulosin (FLOMAX) 0.4 MG CAPS capsule Take 0.4 mg by mouth in the morning and at bedtime.     Tiotropium Bromide-Olodaterol (STIOLTO RESPIMAT) 2.5-2.5 MCG/ACT AERS Inhale 2 puffs into the lungs daily.     apixaban (ELIQUIS) 5 MG TABS tablet Take 1 tablet (5 mg total) by mouth 2 (two) times daily. Please STOP aspirin when you start this medication. 180 tablet 3    No results found for this or any previous visit (from the past 48 hour(s)). No results found.  Review of Systems  Musculoskeletal:  Positive for arthralgias.  All other systems reviewed and are negative.   Blood pressure (!) 150/77, pulse 66, temperature 97.8 F (36.6 C), temperature source Oral, resp. rate 18, height 6' (1.829 m), weight 75.8 kg, SpO2 97%. Physical Exam Vitals reviewed.  HENT:     Head: Normocephalic.     Nose: Nose normal.     Mouth/Throat:     Mouth: Mucous membranes are moist.  Eyes:     Pupils: Pupils are equal, round, and reactive to light.  Cardiovascular:     Rate and Rhythm: Normal rate.     Pulses: Normal pulses.  Pulmonary:     Effort: Pulmonary effort is normal.  Abdominal:     General: Abdomen is flat.  Musculoskeletal:     Cervical back: Normal range of motion.  Skin:    General: Skin is warm.     Capillary Refill: Capillary refill takes less than 2 seconds.  Neurological:     General: No focal deficit present.     Mental Status: He is alert.  Psychiatric:        Mood and Affect: Mood normal.     Ortho exam demonstrates lack of about 10 degrees of full extension in that left elbow with flexion to around 125. Pronation supination full. Well-healed surgical incision on the lateral aspect of the elbow. 3 x 4 cm lipomatous mass just distal to the medial  epicondyle. Negative Tinel's in this region. Ulnar nerve does not subluxate.  Assessment/Plan Impression is symptomatic left elbow lipoma. I think it may be irritating some branches of that medial antebrachial cutaneous nerve. Andrey Campanile  would like to have this excised. I do not think that is unreasonable at this time. Risk and benefits are discussed with the patient including not limited to infection nerve and vessel damage as well as incomplete pain relief. All questions answered.   Burnard Bunting, MD 01/24/2023, 6:15 AM

## 2023-01-24 NOTE — Transfer of Care (Signed)
Immediate Anesthesia Transfer of Care Note  Patient: Robert Bowers  Procedure(s) Performed: LEFT ELBOW LIPOMA REMOVAL (Left: Elbow)  Patient Location: PACU  Anesthesia Type:General  Level of Consciousness: drowsy  Airway & Oxygen Therapy: Patient Spontanous Breathing, Patient connected to face mask oxygen, and Patient connected to face mask  Post-op Assessment: Report given to RN and Post -op Vital signs reviewed and stable  Post vital signs: Reviewed and stable  Last Vitals:  Vitals Value Taken Time  BP 119/70 01/24/23 0902  Temp 36.2 C 01/24/23 0902  Pulse 78 01/24/23 0905  Resp 23 01/24/23 0905  SpO2 100 % 01/24/23 0905  Vitals shown include unfiled device data.  Last Pain:  Vitals:   01/24/23 0614  TempSrc:   PainSc: 0-No pain         Complications: No notable events documented.

## 2023-01-24 NOTE — Brief Op Note (Signed)
   01/24/2023  8:51 AM  PATIENT:  Robert Bowers  88 y.o. male  PRE-OPERATIVE DIAGNOSIS:  LEFT ELBOW LIPOMA  POST-OPERATIVE DIAGNOSIS:  LEFT ELBOW LIPOMA  PROCEDURE:  Procedure(s): LEFT ELBOW LIPOMA REMOVAL  SURGEON:  Surgeon(s): August Saucer, Corrie Mckusick, MD  ASSISTANT: Karenann Cai, PA  ANESTHESIA:   General  EBL: 1 ml    Total I/O In: 250 [IV Piggyback:250] Out: 1 [Blood:1]  BLOOD ADMINISTERED: none  DRAINS: None  LOCAL MEDICATIONS USED: Plain Marcaine  SPECIMEN: Lipoma to pathology COUNTS:  YES  TOURNIQUET:   Total Tourniquet Time Documented: Upper Arm (Left) - 20 minutes Total: Upper Arm (Left) - 20 minutes   DICTATION: .Other Dictation: Dictation Number [id number]  PLAN OF CARE: Discharge to home after PACU  PATIENT DISPOSITION:  PACU - hemodynamically stable

## 2023-01-24 NOTE — Anesthesia Procedure Notes (Addendum)
Procedure Name: LMA Insertion Date/Time: 01/24/2023 7:44 AM  Performed by: Nederland Nation, MDPre-anesthesia Checklist: Patient identified, Emergency Drugs available, Suction available and Patient being monitored Patient Re-evaluated:Patient Re-evaluated prior to induction Oxygen Delivery Method: Circle system utilized Preoxygenation: Pre-oxygenation with 100% oxygen Induction Type: IV induction LMA: LMA inserted LMA Size: 5.0 Number of attempts: 1 Placement Confirmation: positive ETCO2 and breath sounds checked- equal and bilateral Dental Injury: Teeth and Oropharynx as per pre-operative assessment

## 2023-01-24 NOTE — Op Note (Signed)
NAME: Robert Bowers, ABADI MEDICAL RECORD NO: [id number] ACCOUNT NO: [id number] DATE OF BIRTH: 02-25-1935 FACILITY: [facility] LOCATION: [facility] PHYSICIAN: Graylin Shiver. August Saucer, MD  Operative Report   PREOPERATIVE DIAGNOSIS:  Left arm lipoma, compressing median nerve.  POSTOPERATIVE DIAGNOSIS:  Left arm lipoma, compressing median nerve.  PROCEDURE:  Left elbow lipoma removal.  SURGEON:  Graylin Shiver. August Saucer, MD  ASSISTANT:  Karenann Cai, PA.  INDICATIONS:  The patient is an 88 year old male with a many-month history of left arm pain in the median distribution.  MRI scan shows lipoma intramuscular, which is adjacent to the median nerve.  He has failed conservative measures and presents for  operative management after explanation of risks and benefits.  PROCEDURE IN DETAIL:  The patient was brought to the operating room where a general anesthetic was induced. Preoperative antibiotics administered. Timeout was called.  The left arm was prescrubbed with alcohol and Betadine, allowed the air to dry,  prepped with DuraPrep solution and draped in sterile manner.  [name] was used to cover the operative field.  After calling a timeout, the arm was elevated and exsanguinated with Esmarch wrap.  Tourniquet was inflated.  The patient's skin was extremely  thin.  Not unexpected in an 88 year old patient.  An S-shaped incision was made across the elbow flexion crease extending about 4 cm proximal, 4 cm distal to the elbow flexion crease.  Skin and subcutaneous tissue were sharply divided.  Crossing  superficial nerve branches were protected.  One large vein was ligated.  This was not the basilic vein.  This was a basilic vein branch.  The lipoma was then dissected free from its intramuscular location.  Complete removal was visualized.  This was  compressing the median nerve, which was intact by inspection.  The mass itself measured about 4 x 4 cm.  At this time, thorough irrigation was performed.  Skin edges were anesthetized  using plain Marcaine.  Tourniquet was released.  Bleeding ports were  encountered using bipolar electrocautery.  We were able to use 3-0 Vicryl sutures to close some of the fascia that was overriding the lipoma.  The skin was then closed using 4-0 nylon simple sutures.  There was bruising around this very thin skin even  after the very short procedure lasting about 20 minutes under the tourniquet time.  Well-padded posterior splint was applied.  The patient tolerated the procedure well without immediate complications.  [name]'s assistance was required at all times for  retraction, opening, closing, mobilization of tissue.  His assistance was a medical necessity.   NIK D: 01/24/2023 8:55:33 am T: 01/24/2023 9:38:00 am  JOB: [id number]/ [id number]

## 2023-01-24 NOTE — Anesthesia Postprocedure Evaluation (Signed)
Anesthesia Post Note  Patient: Robert Bowers  Procedure(s) Performed: LEFT ELBOW LIPOMA REMOVAL (Left: Elbow)     Patient location during evaluation: PACU Anesthesia Type: General Level of consciousness: awake and alert Pain management: pain level controlled Vital Signs Assessment: post-procedure vital signs reviewed and stable Respiratory status: spontaneous breathing, nonlabored ventilation, respiratory function stable and patient connected to nasal cannula oxygen Cardiovascular status: blood pressure returned to baseline and stable Postop Assessment: no apparent nausea or vomiting Anesthetic complications: no   No notable events documented.  Last Vitals:  Vitals:   01/24/23 0930 01/24/23 0945  BP: (!) 142/84 135/78  Pulse: 76 68  Resp: 15 15  Temp:  (!) 36.4 C  SpO2: 94% 94%    Last Pain:  Vitals:   01/24/23 0945  TempSrc:   PainSc: 0-No pain                 [place]iskany Nation

## 2023-01-25 ENCOUNTER — Ambulatory Visit: Payer: Medicare HMO | Admitting: Physician Assistant

## 2023-01-25 ENCOUNTER — Encounter (HOSPITAL_COMMUNITY): Payer: Self-pay | Admitting: Orthopedic Surgery

## 2023-01-25 VITALS — BP 151/80 | HR 73 | Temp 98.3°F | Resp 18 | Ht 72.0 in | Wt 169.7 lb

## 2023-01-25 DIAGNOSIS — Z9889 Other specified postprocedural states: Secondary | ICD-10-CM

## 2023-01-25 DIAGNOSIS — I6523 Occlusion and stenosis of bilateral carotid arteries: Secondary | ICD-10-CM

## 2023-01-25 LAB — SURGICAL PATHOLOGY

## 2023-01-25 NOTE — Progress Notes (Signed)
Office Note     CC:  follow up Requesting Provider:  Cleatis Polka., MD  HPI: Robert Bowers is a 88 y.o. ([birth year]) male who presents for surveillance follow up of carotid artery stenosis. He has remote history of left CEA in 2013 by Dr. Hart [place]. This was for asymptomatic high grade stenosis. We have been following his bilateral 1-39% stenosis since. He has had no recurrent TIA or Stroke like symptoms  Today he reports overall doing well. He had surgery yesterday to remove a lipoma from his left elbow.  He denies any amaurosis fugax. He does say that he was recently started on Dupixent and since then has noticed some visual changes. Otherwise no slurred speech, facial drooping, unilateral upper or lower extremity weakness or numbness. He does not have any trouble with ambulation. He denies any pain in his legs when walking or at rest. He does report some hip discomfort but that has been present for some time. No non healing wounds. He does have frequent cramping in his legs at night. He usually gets a spoonful of mustard from fridge and this helps. He does have some mild swelling in his legs but this is not bothersome to him. He does not elevate or wear any compression.  He was just told he has atrial fibrillation at time of his surgery yesterday to remove lipoma on his left arm. Has follow up with Cardiology on Monday 01/30/23 for further management. He was advised to start Eliquis but he is apprehensive and is waiting for another evaluation prior to taking it.  He is medically managed on Statin.   Past Medical History:  Diagnosis Date   Arthritis    Bilateral Hands   Carotid artery occlusion    Complication of anesthesia    difficulty awakening from anesthesia, & N & V   COPD (chronic obstructive pulmonary disease) (HCC)    GERD (gastroesophageal reflux disease)    Hyperlipidemia    Hypertension    PONV (postoperative nausea and vomiting)    Recurrent upper respiratory infection  (URI)    last bout of bronchitis 04/2011.   Rupture of kidney    Shortness of breath    at times due to COPD.   Stroke [facility])    no deficits.    Past Surgical History:  Procedure Laterality Date   APPENDECTOMY     ELBOW SURGERY Left    x3   ENDARTERECTOMY  06/29/2011   Procedure: ENDARTERECTOMY CAROTID;  Surgeon: Pryor Ochoa, MD;  Location: [facility] OR;  Service: Vascular;  Laterality: Left;   EYE SURGERY  Aug. and Dec. 2012   Detatched retina and Macular Deg.   EYE SURGERY Bilateral 03/31/2013   Eyelid / Eyebrow   INGUINAL HERNIA REPAIR Right 08/17/2020   Procedure: LAPAROSCOPIC RIGHT INGUINAL HERNIA REPAIR;  Surgeon: Axel Filler, MD;  Location: [facility] OR;  Service: General;  Laterality: Right;   INSERTION OF MESH  08/17/2020   Procedure: INSERTION OF MESH;  Surgeon: Axel Filler, MD;  Location: [facility] OR;  Service: General;;   KIDNEY SURGERY     LIPOMA EXCISION Left 01/24/2023   Procedure: LEFT ELBOW LIPOMA REMOVAL;  Surgeon: Cammy Copa, MD;  Location: [facility] OR;  Service: Orthopedics;  Laterality: Left;   SHOULDER SURGERY Left     Social History   Socioeconomic History   Marital status: Married    Spouse name: Scarlette Calico   Number of children: 1   Years of education: Not on file  Highest education level: Not on file  Occupational History   Occupation: retired  Tobacco Use   Smoking status: Former    Current packs/day: 0.00    Average packs/day: 1 pack/day for 35.0 years (35.0 ttl pk-yrs)    Types: Cigarettes    Start date: 06/02/1956    Quit date: 06/03/1991    Years since quitting: 31.6   Smokeless tobacco: Never  Vaping Use   Vaping status: Never Used  Substance and Sexual Activity   Alcohol use: Yes    Alcohol/week: 12.0 standard drinks of alcohol    Types: 12 Cans of beer per week    Comment: Drinks a beer daily   Drug use: No   Sexual activity: Not on file  Other Topics Concern   Not on file  Social History Narrative   Not on file   Social Determinants  of Health   Financial Resource Strain: Not on file  Food Insecurity: Not on file  Transportation Needs: Not on file  Physical Activity: Not on file  Stress: Not on file  Social Connections: Unknown (07/13/2021)   Received from [facility]   Social Network    Social Network: Not on file  Intimate Partner Violence: Unknown (06/04/2021)   Received from [facility]   HITS    Physically Hurt: Not on file    Insult or Talk Down To: Not on file    Threaten Physical Harm: Not on file    Scream or Curse: Not on file    Family History  Problem Relation Age of Onset   Heart disease Mother        Heart Disease before age 41   Hypertension Mother    Hyperlipidemia Mother    Heart attack Mother    Varicose Veins Mother    Kidney disease Father    Anesthesia problems Neg Hx    Colon cancer Neg Hx     Current Outpatient Medications  Medication Sig Dispense Refill   acetaminophen (TYLENOL) 500 MG tablet Take 500 mg by mouth every 6 (six) hours as needed for moderate pain.     apixaban (ELIQUIS) 5 MG TABS tablet Take 1 tablet (5 mg total) by mouth 2 (two) times daily. Please STOP aspirin when you start this medication. 180 tablet 3   atorvastatin (LIPITOR) 40 MG tablet Take 40 mg by mouth daily.     calcium carbonate (TUMS - DOSED IN MG ELEMENTAL CALCIUM) 500 MG chewable tablet Chew 1,000 mg by mouth daily as needed for indigestion or heartburn.     clobetasol cream (TEMOVATE) 0.05 % Apply 1 application  topically daily as needed (itching).     DUPIXENT 300 MG/2ML SOAJ Inject 300 mg into the skin every 14 (fourteen) days.     famotidine (PEPCID) 20 MG tablet Take 1 tablet (20 mg total) by mouth 2 (two) times daily. 30 tablet 3   nebivolol (BYSTOLIC) 5 MG tablet Take 5 mg by mouth daily.     omeprazole (PRILOSEC) 40 MG capsule TAKE 1 CAPSULE (40 MG TOTAL) BY MOUTH DAILY. 90 capsule 0   Polyethyl Glyc-Propyl Glyc PF (SYSTANE HYDRATION PF) 0.4-0.3 % SOLN Place 1  drop into both eyes daily.     prednisoLONE acetate (PRED FORTE) 1 % ophthalmic suspension Place 1 drop into the right eye in the morning, at noon, and at bedtime.     tamsulosin (FLOMAX) 0.4 MG CAPS capsule Take 0.4 mg by mouth in the morning and at  bedtime.     Tiotropium Bromide-Olodaterol (STIOLTO RESPIMAT) 2.5-2.5 MCG/ACT AERS Inhale 2 puffs into the lungs daily.     traMADol (ULTRAM) 50 MG tablet Take 1 tablet (50 mg total) by mouth every 6 (six) hours as needed. 20 tablet 0   No current facility-administered medications for this visit.    Allergies  Allergen Reactions   Vicodin [Hydrocodone-Acetaminophen] Other (See Comments)    Hallucinations.   Ace Inhibitors Cough   Latex Rash   Tape Itching and Rash    Paper Tape is okay.     REVIEW OF SYSTEMS:  [X]  denotes positive finding, [ ]  denotes negative finding Cardiac  Comments:  Chest pain or chest pressure:    Shortness of breath upon exertion:    Short of breath when lying flat:    Irregular heart rhythm:        Vascular    Pain in calf, thigh, or hip brought on by ambulation:    Pain in feet at night that wakes you up from your sleep:     Blood clot in your veins:    Leg swelling:         Pulmonary    Oxygen at home:    Productive cough:     Wheezing:         Neurologic    Sudden weakness in arms or legs:     Sudden numbness in arms or legs:     Sudden onset of difficulty speaking or slurred speech:    Temporary loss of vision in one eye:     Problems with dizziness:         Gastrointestinal    Blood in stool:     Vomited blood:         Genitourinary    Burning when urinating:     Blood in urine:        Psychiatric    Major depression:         Hematologic    Bleeding problems:    Problems with blood clotting too easily:        Skin    Rashes or ulcers:        Constitutional    Fever or chills:      PHYSICAL EXAMINATION:  Vitals:   01/25/23 1330  BP: (!) 151/80  Pulse: 73  Resp: 18   Temp: 98.3 F (36.8 C)  TempSrc: Temporal  SpO2: 97%  Weight: 169 lb 11.2 oz (77 kg)  Height: 6' (1.829 m)    General:  WDWN in NAD; vital signs documented above Gait: Normal HENT: WNL, normocephalic Pulmonary: normal non-labored breathing , without wheezing Cardiac: regular HR Abdomen: soft Vascular Exam/Pulses: 2+ radial, 2+ DP pulses bilaterally, feet warm and well perfused Extremities: without ischemic changes, without Gangrene , without cellulitis; without open wounds; mild edema, left greater than right leg/ ankle. Multiple varicose veins and spider veins of both lower legs Musculoskeletal: no muscle wasting or atrophy  Neurologic: A&O X 3 Psychiatric:  The pt has Normal affect.  Non-Invasive Vascular Imaging:  01/06/23 VAS US Carotid Duplex: Summary:  Right Carotid: Velocities in the right ICA are consistent with a 1-39% stenosis.   Left Carotid: Velocities in the left ICA are consistent with a 1-39% stenosis.   Vertebrals: Bilateral vertebral arteries demonstrate antegrade flow.  Subclavians: Normal flow hemodynamics were seen in bilateral subclavian  arteries.   ASSESSMENT/PLAN:: 88 y.o. male here for follow up for carotid artery stenosis. He has remote history  of left CEA in 2013 by Dr. Hart [place]. This was for asymptomatic high grade stenosis. We have been following his bilateral 1-39% stenosis since. He has had no recurrent TIA or Stroke like symptoms. His duplex today is stable with bilateral 1-39% ICA stenosis. Normal flow in the vertebral and subclavian arteries bilaterally. No new neurological symptoms.  - Elevate legs as needed to help with swelling and cramping - Continue Statin - Discussed that he could keep his routine follow up or follow up as needed because the likelihood of needing any carotid intervention in his lifetime is very unlikely - He and his wife would like to follow up again in 18 months with carotid duplex   Graceann Congress, PA-C [facility] [phone]  Clinic MD:   Randie Heinz

## 2023-01-30 DIAGNOSIS — R2232 Localized swelling, mass and lump, left upper limb: Secondary | ICD-10-CM

## 2023-01-30 DIAGNOSIS — Z1330 Encounter for screening examination for mental health and behavioral disorders, unspecified: Secondary | ICD-10-CM | POA: Diagnosis not present

## 2023-01-30 DIAGNOSIS — I4891 Unspecified atrial fibrillation: Secondary | ICD-10-CM | POA: Diagnosis not present

## 2023-02-01 ENCOUNTER — Telehealth (HOSPITAL_COMMUNITY): Payer: Self-pay | Admitting: Cardiology

## 2023-02-01 ENCOUNTER — Ambulatory Visit: Payer: Medicare HMO | Admitting: Orthopedic Surgery

## 2023-02-01 ENCOUNTER — Encounter: Payer: Self-pay | Admitting: Orthopedic Surgery

## 2023-02-01 DIAGNOSIS — R2232 Localized swelling, mass and lump, left upper limb: Secondary | ICD-10-CM

## 2023-02-01 NOTE — Telephone Encounter (Signed)
Pt canceled afib clinic appt from referral. Established with [facility]

## 2023-02-01 NOTE — Telephone Encounter (Signed)
Patients spouse called and cancelled echocardiogram and does not wish to reschedule at this time. Order will be removed from the echo WQ. Thank you.

## 2023-02-01 NOTE — Progress Notes (Signed)
Post-Op Visit Note   Patient: Robert Bowers           Date of Birth: February 16, 1935           MRN: [id number] Visit Date: 02/01/2023 PCP: Cleatis Polka., MD   Assessment & Plan:  Chief Complaint:  Chief Complaint  Patient presents with   Left Elbow - Routine Post Op    LEFT ELBOW LIPOMA REMOVAL (surgery date 01-24-23)   Visit Diagnoses: No diagnosis found.  Plan: Robert Bowers is an 88 year old patient is 8 days out left elbow lipoma removal.  Pathology benign.  On exam incision intact.  Patient is now on Eliquis.  Range of motion intact.  Motor or sensory function to the hand intact.  Expected amount of postop bruising is present.  No erythema or induration.  Overall the skin is healed reasonably well considering how thin it was at the time of surgery.  Plan at this time is elbow range of motion as tolerated and come back to see Robert Bowers on Monday for suture removal and release.  Follow-Up Instructions: No follow-ups on file.   Orders:  No orders of the defined types were placed in this encounter.  No orders of the defined types were placed in this encounter.   Imaging: No results found.  PMFS History: Patient Active Problem List   Diagnosis Date Noted   Elbow mass, left 01/30/2023   Gastroesophageal reflux disease 03/26/2020   Globus sensation 03/26/2020   Aftercare following surgery of the circulatory system, NEC 07/23/2013   Occlusion and stenosis of carotid artery without mention of cerebral infarction 06/07/2011   Past Medical History:  Diagnosis Date   Arthritis    Bilateral Hands   Carotid artery occlusion    Complication of anesthesia    difficulty awakening from anesthesia, & N & V   COPD (chronic obstructive pulmonary disease) (HCC)    GERD (gastroesophageal reflux disease)    Hyperlipidemia    Hypertension    PONV (postoperative nausea and vomiting)    Recurrent upper respiratory infection (URI)    last bout of bronchitis 04/2011.   Rupture of kidney     Shortness of breath    at times due to COPD.   Stroke [facility])    no deficits.    Family History  Problem Relation Age of Onset   Heart disease Mother        Heart Disease before age 71   Hypertension Mother    Hyperlipidemia Mother    Heart attack Mother    Varicose Veins Mother    Kidney disease Father    Anesthesia problems Neg Hx    Colon cancer Neg Hx     Past Surgical History:  Procedure Laterality Date   APPENDECTOMY     ELBOW SURGERY Left    x3   ENDARTERECTOMY  06/29/2011   Procedure: ENDARTERECTOMY CAROTID;  Surgeon: Pryor Ochoa, MD;  Location: [facility] OR;  Service: Vascular;  Laterality: Left;   EYE SURGERY  Aug. and Dec. 2012   Detatched retina and Macular Deg.   EYE SURGERY Bilateral 03/31/2013   Eyelid / Eyebrow   INGUINAL HERNIA REPAIR Right 08/17/2020   Procedure: LAPAROSCOPIC RIGHT INGUINAL HERNIA REPAIR;  Surgeon: Axel Filler, MD;  Location: [facility] OR;  Service: General;  Laterality: Right;   INSERTION OF MESH  08/17/2020   Procedure: INSERTION OF MESH;  Surgeon: Axel Filler, MD;  Location: [facility] OR;  Service: General;;   KIDNEY SURGERY  LIPOMA EXCISION Left 01/24/2023   Procedure: LEFT ELBOW LIPOMA REMOVAL;  Surgeon: Cammy Copa, MD;  Location: [facility] OR;  Service: Orthopedics;  Laterality: Left;   SHOULDER SURGERY Left    Social History   Occupational History   Occupation: retired  Tobacco Use   Smoking status: Former    Current packs/day: 0.00    Average packs/day: 1 pack/day for 35.0 years (35.0 ttl pk-yrs)    Types: Cigarettes    Start date: 06/02/1956    Quit date: 06/03/1991    Years since quitting: 31.6   Smokeless tobacco: Never  Vaping Use   Vaping status: Never Used  Substance and Sexual Activity   Alcohol use: Yes    Alcohol/week: 12.0 standard drinks of alcohol    Types: 12 Cans of beer per week    Comment: Drinks a beer daily   Drug use: No   Sexual activity: Not on file

## 2023-02-02 DIAGNOSIS — I351 Nonrheumatic aortic (valve) insufficiency: Secondary | ICD-10-CM | POA: Diagnosis not present

## 2023-02-02 DIAGNOSIS — K219 Gastro-esophageal reflux disease without esophagitis: Secondary | ICD-10-CM | POA: Diagnosis not present

## 2023-02-02 DIAGNOSIS — I4819 Other persistent atrial fibrillation: Secondary | ICD-10-CM | POA: Diagnosis not present

## 2023-02-02 DIAGNOSIS — Z87891 Personal history of nicotine dependence: Secondary | ICD-10-CM | POA: Diagnosis not present

## 2023-02-02 DIAGNOSIS — I083 Combined rheumatic disorders of mitral, aortic and tricuspid valves: Secondary | ICD-10-CM | POA: Diagnosis not present

## 2023-02-02 DIAGNOSIS — I10 Essential (primary) hypertension: Secondary | ICD-10-CM | POA: Diagnosis not present

## 2023-02-02 DIAGNOSIS — J449 Chronic obstructive pulmonary disease, unspecified: Secondary | ICD-10-CM | POA: Diagnosis not present

## 2023-02-02 DIAGNOSIS — I700 Atherosclerosis of aorta: Secondary | ICD-10-CM | POA: Diagnosis not present

## 2023-02-02 DIAGNOSIS — Z8673 Personal history of transient ischemic attack (TIA), and cerebral infarction without residual deficits: Secondary | ICD-10-CM | POA: Diagnosis not present

## 2023-02-02 DIAGNOSIS — E785 Hyperlipidemia, unspecified: Secondary | ICD-10-CM | POA: Diagnosis not present

## 2023-02-02 DIAGNOSIS — I4891 Unspecified atrial fibrillation: Secondary | ICD-10-CM | POA: Diagnosis not present

## 2023-02-02 DIAGNOSIS — I6523 Occlusion and stenosis of bilateral carotid arteries: Secondary | ICD-10-CM | POA: Diagnosis not present

## 2023-02-02 DIAGNOSIS — Z9889 Other specified postprocedural states: Secondary | ICD-10-CM | POA: Diagnosis not present

## 2023-02-06 ENCOUNTER — Ambulatory Visit (INDEPENDENT_AMBULATORY_CARE_PROVIDER_SITE_OTHER): Payer: Medicare HMO | Admitting: Surgical

## 2023-02-06 ENCOUNTER — Ambulatory Visit (HOSPITAL_COMMUNITY): Payer: Medicare HMO | Admitting: Internal Medicine

## 2023-02-06 DIAGNOSIS — R2232 Localized swelling, mass and lump, left upper limb: Secondary | ICD-10-CM

## 2023-02-09 DIAGNOSIS — Z8669 Personal history of other diseases of the nervous system and sense organs: Secondary | ICD-10-CM | POA: Diagnosis not present

## 2023-02-09 DIAGNOSIS — H35351 Cystoid macular degeneration, right eye: Secondary | ICD-10-CM | POA: Diagnosis not present

## 2023-02-09 DIAGNOSIS — H5213 Myopia, bilateral: Secondary | ICD-10-CM | POA: Diagnosis not present

## 2023-02-09 DIAGNOSIS — H02403 Unspecified ptosis of bilateral eyelids: Secondary | ICD-10-CM | POA: Diagnosis not present

## 2023-02-09 DIAGNOSIS — G245 Blepharospasm: Secondary | ICD-10-CM | POA: Diagnosis not present

## 2023-02-09 DIAGNOSIS — Z961 Presence of intraocular lens: Secondary | ICD-10-CM | POA: Diagnosis not present

## 2023-02-12 ENCOUNTER — Encounter: Payer: Self-pay | Admitting: Surgical

## 2023-02-12 NOTE — Progress Notes (Signed)
Post-Op Visit Note   Patient: Robert Bowers           Date of Birth: 1934-11-11           MRN: [id number] Visit Date: 02/06/2023 PCP: Cleatis Polka., MD   Assessment & Plan:  Chief Complaint:  Chief Complaint  Patient presents with   Left Elbow - Routine Post Op      LEFT ELBOW LIPOMA REMOVAL (surgery date 01-24-23)     Visit Diagnoses:  1. Mass of left elbow     Plan: Patient is a 88 year old male who presents for evaluation s/p left elbow lipoma removal on 01/24/2023.  He is doing well without any significant complaints aside from the sutures that are still in the incision poking into his elbow.  He denies any fevers, chills, drainage from the incision.  The pain he was having preoperatively has improved though he has a new pain with soreness in the area of the incision as expected.  On exam, patient has intact EPL, FPL, finger abduction with 2+ radial pulse of the left upper extremity.  Incision looks to be healing well with sutures intact.  Skin is markedly improved compared with time of surgery.  There is no evidence of cellulitis or dehiscence of the incision.  Each nylon suture was taken out starting with every other suture and after no evidence of incision dehiscence, the rest of the sutures were removed.  These were replaced with Steri-Strips.  He was cautioned against any lifting with the operative arm or any submersion underwater though he can take a shower.  Follow-up with the office in 2 to 3 weeks with Dr. August Saucer and likely release at that time depending on how the incision is looking.  Follow-Up Instructions: No follow-ups on file.   Orders:  No orders of the defined types were placed in this encounter.  No orders of the defined types were placed in this encounter.   Imaging: No results found.  PMFS History: Patient Active Problem List   Diagnosis Date Noted   Elbow mass, left 01/30/2023   Gastroesophageal reflux disease 03/26/2020   Globus sensation  03/26/2020   Aftercare following surgery of the circulatory system, NEC 07/23/2013   Occlusion and stenosis of carotid artery without mention of cerebral infarction 06/07/2011   Past Medical History:  Diagnosis Date   Arthritis    Bilateral Hands   Carotid artery occlusion    Complication of anesthesia    difficulty awakening from anesthesia, & N & V   COPD (chronic obstructive pulmonary disease) (HCC)    GERD (gastroesophageal reflux disease)    Hyperlipidemia    Hypertension    PONV (postoperative nausea and vomiting)    Recurrent upper respiratory infection (URI)    last bout of bronchitis 04/2011.   Rupture of kidney    Shortness of breath    at times due to COPD.   Stroke [facility])    no deficits.    Family History  Problem Relation Age of Onset   Heart disease Mother        Heart Disease before age 31   Hypertension Mother    Hyperlipidemia Mother    Heart attack Mother    Varicose Veins Mother    Kidney disease Father    Anesthesia problems Neg Hx    Colon cancer Neg Hx     Past Surgical History:  Procedure Laterality Date   APPENDECTOMY     ELBOW SURGERY Left  x3   ENDARTERECTOMY  06/29/2011   Procedure: ENDARTERECTOMY CAROTID;  Surgeon: Pryor Ochoa, MD;  Location: [facility] OR;  Service: Vascular;  Laterality: Left;   EYE SURGERY  Aug. and Dec. 2012   Detatched retina and Macular Deg.   EYE SURGERY Bilateral 03/31/2013   Eyelid / Eyebrow   INGUINAL HERNIA REPAIR Right 08/17/2020   Procedure: LAPAROSCOPIC RIGHT INGUINAL HERNIA REPAIR;  Surgeon: Axel Filler, MD;  Location: [facility] OR;  Service: General;  Laterality: Right;   INSERTION OF MESH  08/17/2020   Procedure: INSERTION OF MESH;  Surgeon: Axel Filler, MD;  Location: [facility] OR;  Service: General;;   KIDNEY SURGERY     LIPOMA EXCISION Left 01/24/2023   Procedure: LEFT ELBOW LIPOMA REMOVAL;  Surgeon: Cammy Copa, MD;  Location: [facility] OR;  Service: Orthopedics;  Laterality: Left;   SHOULDER SURGERY Left     Social History   Occupational History   Occupation: retired  Tobacco Use   Smoking status: Former    Current packs/day: 0.00    Average packs/day: 1 pack/day for 35.0 years (35.0 ttl pk-yrs)    Types: Cigarettes    Start date: 06/02/1956    Quit date: 06/03/1991    Years since quitting: 31.7   Smokeless tobacco: Never  Vaping Use   Vaping status: Never Used  Substance and Sexual Activity   Alcohol use: Yes    Alcohol/week: 12.0 standard drinks of alcohol    Types: 12 Cans of beer per week    Comment: Drinks a beer daily   Drug use: No   Sexual activity: Not on file

## 2023-02-13 DIAGNOSIS — Z8673 Personal history of transient ischemic attack (TIA), and cerebral infarction without residual deficits: Secondary | ICD-10-CM | POA: Insufficient documentation

## 2023-02-13 DIAGNOSIS — J449 Chronic obstructive pulmonary disease, unspecified: Secondary | ICD-10-CM | POA: Insufficient documentation

## 2023-02-13 DIAGNOSIS — Z7901 Long term (current) use of anticoagulants: Secondary | ICD-10-CM | POA: Insufficient documentation

## 2023-02-13 DIAGNOSIS — I6523 Occlusion and stenosis of bilateral carotid arteries: Secondary | ICD-10-CM | POA: Insufficient documentation

## 2023-02-14 DIAGNOSIS — I10 Essential (primary) hypertension: Secondary | ICD-10-CM | POA: Diagnosis not present

## 2023-02-14 DIAGNOSIS — Z8673 Personal history of transient ischemic attack (TIA), and cerebral infarction without residual deficits: Secondary | ICD-10-CM | POA: Diagnosis not present

## 2023-02-14 DIAGNOSIS — I4819 Other persistent atrial fibrillation: Secondary | ICD-10-CM | POA: Diagnosis not present

## 2023-02-14 DIAGNOSIS — I6523 Occlusion and stenosis of bilateral carotid arteries: Secondary | ICD-10-CM | POA: Diagnosis not present

## 2023-02-14 DIAGNOSIS — E785 Hyperlipidemia, unspecified: Secondary | ICD-10-CM | POA: Diagnosis not present

## 2023-02-14 DIAGNOSIS — Z1330 Encounter for screening examination for mental health and behavioral disorders, unspecified: Secondary | ICD-10-CM | POA: Diagnosis not present

## 2023-02-14 DIAGNOSIS — Z7901 Long term (current) use of anticoagulants: Secondary | ICD-10-CM | POA: Diagnosis not present

## 2023-02-14 DIAGNOSIS — J449 Chronic obstructive pulmonary disease, unspecified: Secondary | ICD-10-CM | POA: Diagnosis not present

## 2023-02-20 DIAGNOSIS — G4733 Obstructive sleep apnea (adult) (pediatric): Secondary | ICD-10-CM | POA: Diagnosis not present

## 2023-02-20 DIAGNOSIS — I4891 Unspecified atrial fibrillation: Secondary | ICD-10-CM | POA: Diagnosis not present

## 2023-02-21 DIAGNOSIS — H35351 Cystoid macular degeneration, right eye: Secondary | ICD-10-CM | POA: Diagnosis not present

## 2023-02-21 DIAGNOSIS — Z8669 Personal history of other diseases of the nervous system and sense organs: Secondary | ICD-10-CM | POA: Diagnosis not present

## 2023-02-21 DIAGNOSIS — H5315 Visual distortions of shape and size: Secondary | ICD-10-CM | POA: Diagnosis not present

## 2023-02-21 DIAGNOSIS — Z961 Presence of intraocular lens: Secondary | ICD-10-CM | POA: Diagnosis not present

## 2023-02-21 DIAGNOSIS — G245 Blepharospasm: Secondary | ICD-10-CM | POA: Diagnosis not present

## 2023-02-27 ENCOUNTER — Ambulatory Visit (INDEPENDENT_AMBULATORY_CARE_PROVIDER_SITE_OTHER): Payer: Medicare HMO | Admitting: Surgical

## 2023-02-27 ENCOUNTER — Encounter: Payer: Self-pay | Admitting: Surgical

## 2023-02-27 DIAGNOSIS — R2232 Localized swelling, mass and lump, left upper limb: Secondary | ICD-10-CM

## 2023-02-27 NOTE — Progress Notes (Cosign Needed)
Post-Op Visit Note   Patient: Robert Bowers           Date of Birth: 16-Jun-1934           MRN: [id number] Visit Date: 02/27/2023 PCP: Cleatis Polka., MD   Assessment & Plan:  Chief Complaint:  Chief Complaint  Patient presents with   Left Elbow - Routine Post Op            LEFT ELBOW LIPOMA REMOVAL (surgery date 01-24-23)      Visit Diagnoses:  1. Mass of left elbow     Plan: Patient is a 88 year old male who presents s/p left elbow lipoma removal on 01/24/2023.  He is about a month out.  Does note some occasional pain in the area of the incision that will radiate down to the distal forearm.  The pain that was radiating proximally from the elbow has completely resolved.  He feels about 30% improvement compared with prior to surgery at this point.  He has not been doing any lifting.  No fevers or chills.  No drainage from the incision.  Steri-Strips removed today in the office.  Incision looks to be healing well without any evidence of infection or dehiscence.  He has 2+ radial pulse of the operative extremity.  Intact EPL, FPL, finger abduction, grip strength testing.  Full active range of motion of the left elbow.  Plan is increase activity as tolerated with pain as his guide.  Follow-up in 4 weeks for clinical recheck with Dr. August Saucer with likely release at that time.  Follow-Up Instructions: No follow-ups on file.   Orders:  No orders of the defined types were placed in this encounter.  No orders of the defined types were placed in this encounter.   Imaging: No results found.  PMFS History: Patient Active Problem List   Diagnosis Date Noted   Elbow mass, left 01/30/2023   Gastroesophageal reflux disease 03/26/2020   Globus sensation 03/26/2020   Aftercare following surgery of the circulatory system, NEC 07/23/2013   Occlusion and stenosis of carotid artery without mention of cerebral infarction 06/07/2011   Past Medical History:  Diagnosis Date   Arthritis     Bilateral Hands   Carotid artery occlusion    Complication of anesthesia    difficulty awakening from anesthesia, & N & V   COPD (chronic obstructive pulmonary disease) (HCC)    GERD (gastroesophageal reflux disease)    Hyperlipidemia    Hypertension    PONV (postoperative nausea and vomiting)    Recurrent upper respiratory infection (URI)    last bout of bronchitis 04/2011.   Rupture of kidney    Shortness of breath    at times due to COPD.   Stroke [facility])    no deficits.    Family History  Problem Relation Age of Onset   Heart disease Mother        Heart Disease before age 12   Hypertension Mother    Hyperlipidemia Mother    Heart attack Mother    Varicose Veins Mother    Kidney disease Father    Anesthesia problems Neg Hx    Colon cancer Neg Hx     Past Surgical History:  Procedure Laterality Date   APPENDECTOMY     ELBOW SURGERY Left    x3   ENDARTERECTOMY  06/29/2011   Procedure: ENDARTERECTOMY CAROTID;  Surgeon: Pryor Ochoa, MD;  Location: [facility] OR;  Service: Vascular;  Laterality: Left;  EYE SURGERY  Aug. and Dec. 2012   Detatched retina and Macular Deg.   EYE SURGERY Bilateral 03/31/2013   Eyelid / Eyebrow   INGUINAL HERNIA REPAIR Right 08/17/2020   Procedure: LAPAROSCOPIC RIGHT INGUINAL HERNIA REPAIR;  Surgeon: Axel Filler, MD;  Location: [facility] OR;  Service: General;  Laterality: Right;   INSERTION OF MESH  08/17/2020   Procedure: INSERTION OF MESH;  Surgeon: Axel Filler, MD;  Location: [facility] OR;  Service: General;;   KIDNEY SURGERY     LIPOMA EXCISION Left 01/24/2023   Procedure: LEFT ELBOW LIPOMA REMOVAL;  Surgeon: Cammy Copa, MD;  Location: [facility] OR;  Service: Orthopedics;  Laterality: Left;   SHOULDER SURGERY Left    Social History   Occupational History   Occupation: retired  Tobacco Use   Smoking status: Former    Current packs/day: 0.00    Average packs/day: 1 pack/day for 35.0 years (35.0 ttl pk-yrs)    Types: Cigarettes    Start  date: 06/02/1956    Quit date: 06/03/1991    Years since quitting: 31.7   Smokeless tobacco: Never  Vaping Use   Vaping status: Never Used  Substance and Sexual Activity   Alcohol use: Yes    Alcohol/week: 12.0 standard drinks of alcohol    Types: 12 Cans of beer per week    Comment: Drinks a beer daily   Drug use: No   Sexual activity: Not on file

## 2023-03-02 DIAGNOSIS — I129 Hypertensive chronic kidney disease with stage 1 through stage 4 chronic kidney disease, or unspecified chronic kidney disease: Secondary | ICD-10-CM | POA: Diagnosis not present

## 2023-03-02 DIAGNOSIS — E785 Hyperlipidemia, unspecified: Secondary | ICD-10-CM | POA: Diagnosis not present

## 2023-03-02 DIAGNOSIS — N1831 Chronic kidney disease, stage 3a: Secondary | ICD-10-CM | POA: Diagnosis not present

## 2023-03-02 DIAGNOSIS — M8580 Other specified disorders of bone density and structure, unspecified site: Secondary | ICD-10-CM | POA: Diagnosis not present

## 2023-03-07 ENCOUNTER — Other Ambulatory Visit (HOSPITAL_COMMUNITY): Payer: Medicare HMO

## 2023-03-14 DIAGNOSIS — I10 Essential (primary) hypertension: Secondary | ICD-10-CM | POA: Diagnosis not present

## 2023-03-14 DIAGNOSIS — Z0000 Encounter for general adult medical examination without abnormal findings: Secondary | ICD-10-CM | POA: Diagnosis not present

## 2023-03-14 DIAGNOSIS — Z1339 Encounter for screening examination for other mental health and behavioral disorders: Secondary | ICD-10-CM | POA: Diagnosis not present

## 2023-03-14 DIAGNOSIS — N1831 Chronic kidney disease, stage 3a: Secondary | ICD-10-CM | POA: Diagnosis not present

## 2023-03-14 DIAGNOSIS — D6869 Other thrombophilia: Secondary | ICD-10-CM | POA: Diagnosis not present

## 2023-03-14 DIAGNOSIS — E785 Hyperlipidemia, unspecified: Secondary | ICD-10-CM | POA: Diagnosis not present

## 2023-03-14 DIAGNOSIS — J449 Chronic obstructive pulmonary disease, unspecified: Secondary | ICD-10-CM | POA: Diagnosis not present

## 2023-03-14 DIAGNOSIS — I69954 Hemiplegia and hemiparesis following unspecified cerebrovascular disease affecting left non-dominant side: Secondary | ICD-10-CM | POA: Diagnosis not present

## 2023-03-14 DIAGNOSIS — Z23 Encounter for immunization: Secondary | ICD-10-CM | POA: Diagnosis not present

## 2023-03-14 DIAGNOSIS — I6529 Occlusion and stenosis of unspecified carotid artery: Secondary | ICD-10-CM | POA: Diagnosis not present

## 2023-03-14 DIAGNOSIS — I480 Paroxysmal atrial fibrillation: Secondary | ICD-10-CM | POA: Diagnosis not present

## 2023-03-14 DIAGNOSIS — I129 Hypertensive chronic kidney disease with stage 1 through stage 4 chronic kidney disease, or unspecified chronic kidney disease: Secondary | ICD-10-CM | POA: Diagnosis not present

## 2023-03-14 DIAGNOSIS — Z1331 Encounter for screening for depression: Secondary | ICD-10-CM | POA: Diagnosis not present

## 2023-04-11 DIAGNOSIS — D485 Neoplasm of uncertain behavior of skin: Secondary | ICD-10-CM | POA: Diagnosis not present

## 2023-04-11 DIAGNOSIS — L2089 Other atopic dermatitis: Secondary | ICD-10-CM | POA: Diagnosis not present

## 2023-04-11 DIAGNOSIS — B078 Other viral warts: Secondary | ICD-10-CM | POA: Diagnosis not present

## 2023-04-20 ENCOUNTER — Ambulatory Visit: Payer: Medicare HMO | Admitting: Cardiology

## 2023-05-16 DIAGNOSIS — I2089 Other forms of angina pectoris: Secondary | ICD-10-CM | POA: Diagnosis not present

## 2023-05-16 DIAGNOSIS — I10 Essential (primary) hypertension: Secondary | ICD-10-CM | POA: Diagnosis not present

## 2023-05-16 DIAGNOSIS — I480 Paroxysmal atrial fibrillation: Secondary | ICD-10-CM | POA: Diagnosis not present

## 2023-05-18 DIAGNOSIS — Z1330 Encounter for screening examination for mental health and behavioral disorders, unspecified: Secondary | ICD-10-CM | POA: Diagnosis not present

## 2023-05-18 DIAGNOSIS — R001 Bradycardia, unspecified: Secondary | ICD-10-CM | POA: Diagnosis not present

## 2023-05-18 DIAGNOSIS — I4819 Other persistent atrial fibrillation: Secondary | ICD-10-CM | POA: Diagnosis not present

## 2023-05-18 DIAGNOSIS — R5383 Other fatigue: Secondary | ICD-10-CM | POA: Diagnosis not present

## 2023-05-31 DIAGNOSIS — I2089 Other forms of angina pectoris: Secondary | ICD-10-CM | POA: Diagnosis not present

## 2023-06-01 ENCOUNTER — Other Ambulatory Visit (INDEPENDENT_AMBULATORY_CARE_PROVIDER_SITE_OTHER): Payer: Self-pay

## 2023-06-01 ENCOUNTER — Ambulatory Visit: Admitting: Orthopedic Surgery

## 2023-06-01 DIAGNOSIS — M79605 Pain in left leg: Secondary | ICD-10-CM

## 2023-06-02 ENCOUNTER — Encounter: Payer: Self-pay | Admitting: Orthopedic Surgery

## 2023-06-02 DIAGNOSIS — R931 Abnormal findings on diagnostic imaging of heart and coronary circulation: Secondary | ICD-10-CM | POA: Diagnosis not present

## 2023-06-02 DIAGNOSIS — E785 Hyperlipidemia, unspecified: Secondary | ICD-10-CM | POA: Diagnosis not present

## 2023-06-02 DIAGNOSIS — J449 Chronic obstructive pulmonary disease, unspecified: Secondary | ICD-10-CM | POA: Diagnosis not present

## 2023-06-02 DIAGNOSIS — I4819 Other persistent atrial fibrillation: Secondary | ICD-10-CM | POA: Diagnosis not present

## 2023-06-02 DIAGNOSIS — Z7901 Long term (current) use of anticoagulants: Secondary | ICD-10-CM | POA: Diagnosis not present

## 2023-06-02 DIAGNOSIS — I2089 Other forms of angina pectoris: Secondary | ICD-10-CM | POA: Diagnosis not present

## 2023-06-02 DIAGNOSIS — I10 Essential (primary) hypertension: Secondary | ICD-10-CM | POA: Diagnosis not present

## 2023-06-02 DIAGNOSIS — I6523 Occlusion and stenosis of bilateral carotid arteries: Secondary | ICD-10-CM | POA: Diagnosis not present

## 2023-06-02 NOTE — Progress Notes (Signed)
 Office Visit Note   Patient: Robert Bowers           Date of Birth: [birth year]           MRN: [id number] Visit Date: 06/01/2023 Requested by: Cleatis Polka., MD [address] [place],  [place] [address] PCP: Cleatis Polka., MD  Subjective: Chief Complaint  Patient presents with   Left Leg - Pain    HPI: Robert Bowers is a 89 y.o. male who presents to the office reporting 4 to 6 months of left leg pain and low back pain.  Describes pain in the left buttocks radiating to the ankle with occasional weakness.  Pain does keep him awake at night.  He actually has to sleep with his hip flexed in order to give him relief.  With the leg straight he has worsening symptoms.  Hard for him to walk in the morning.  No history of prior back surgery and is really had no workup of the lumbar spine.  Patient feels weakness to stand.  He describes decreased walking endurance.  His wife has had significant back problems and he has been doing home exercises program of stretching and activity modification for at least 3 months.  This has not helped him at all.  In fact his symptoms are getting worse.  He does take Eliquis for atrial fibrillation discovered at his last surgical encounter for elbow lipoma within the past year.  Takes Tylenol for symptoms which does not help much.  Denies any fevers or chills or saddle paresthesias..                ROS: All systems reviewed are negative as they relate to the chief complaint within the history of present illness.  Patient denies fevers or chills.  Assessment & Plan: Visit Diagnoses:  1. Pain in left leg     Plan: Impression is left leg radiculopathy in a patient who has not as much expected degenerative change in the lumbar spine based on his age.  I think he may have bulging or herniated soft tissue disc versus stenosis affecting that left side.  Symptoms ongoing for 6 months with failure of conservative treatment including physical therapy/home exercise  program of stretching and activity modification.  No red flag symptoms today but based on slight weakness with hip flexion as well as duration of symptoms as well as presence of night pain MRI indicated with possible ESI's to follow.  Do not anticipate that this would be a surgical problem.  Follow-Up Instructions: No follow-ups on file.   Orders:  Orders Placed This Encounter  Procedures   XR FEMUR MIN 2 VIEWS LEFT   XR Lumbar Spine 2-3 Views   MR Lumbar Spine w/o contrast   No orders of the defined types were placed in this encounter.     Procedures: No procedures performed   Clinical Data: No additional findings.  Objective: Vital Signs: There were no vitals taken for this visit.  Physical Exam:  Constitutional: Patient appears well-developed HEENT:  Head: Normocephalic Eyes:EOM are normal Neck: Normal range of motion Cardiovascular: Normal rate Pulmonary/chest: Effort normal Neurologic: Patient is alert Skin: Skin is warm Psychiatric: Patient has normal mood and affect  Ortho Exam: Ortho exam demonstrates palpable pedal pulses.  Has 5 out of 5 ankle dorsiflexion plantarflexion quad hamstring strength.  No groin pain with internal/external rotation of the leg.  No trochanteric tenderness is present.  5- out of 5 hip flexion  on the left compared to the right.  Femoral stretch positive on the left negative on the right.  No nerve root tension signs.  No definite paresthesias L1-S1 bilaterally.  Specialty Comments:  No specialty comments available.  Imaging: XR Lumbar Spine 2-3 Views Result Date: 06/02/2023 AP lateral radiographs lumbar spine reviewed.  Mild degenerative changes present in the facet joints and to a lesser degree between the vertebral bodies.  Normal lordosis with slight postural scoliosis noted on the AP view.  Calcification of the aorta is present.  No acute compression fracture or spondylolisthesis.  XR FEMUR MIN 2 VIEWS LEFT Result Date: 06/02/2023 AP  lateral radiographs left femur reviewed.  No acute fracture.  Bone cortices normal.  No hip joint arthritis.  No acute abnormality.    PMFS History: Patient Active Problem List   Diagnosis Date Noted   Elbow mass, left 01/30/2023   Gastroesophageal reflux disease 03/26/2020   Globus sensation 03/26/2020   Aftercare following surgery of the circulatory system, NEC 07/23/2013   Occlusion and stenosis of carotid artery without mention of cerebral infarction 06/07/2011   Past Medical History:  Diagnosis Date   Arthritis    Bilateral Hands   Carotid artery occlusion    Complication of anesthesia    difficulty awakening from anesthesia, & N & V   COPD (chronic obstructive pulmonary disease) (HCC)    GERD (gastroesophageal reflux disease)    Hyperlipidemia    Hypertension    PONV (postoperative nausea and vomiting)    Recurrent upper respiratory infection (URI)    last bout of bronchitis 04/2011.   Rupture of kidney    Shortness of breath    at times due to COPD.   Stroke [facility])    no deficits.    Family History  Problem Relation Age of Onset   Heart disease Mother        Heart Disease before age 46   Hypertension Mother    Hyperlipidemia Mother    Heart attack Mother    Varicose Veins Mother    Kidney disease Father    Anesthesia problems Neg Hx    Colon cancer Neg Hx     Past Surgical History:  Procedure Laterality Date   APPENDECTOMY     ELBOW SURGERY Left    x3   ENDARTERECTOMY  06/29/2011   Procedure: ENDARTERECTOMY CAROTID;  Surgeon: Pryor Ochoa, MD;  Location: [facility] OR;  Service: Vascular;  Laterality: Left;   EYE SURGERY  Aug. and Dec. 2012   Detatched retina and Macular Deg.   EYE SURGERY Bilateral 03/31/2013   Eyelid / Eyebrow   INGUINAL HERNIA REPAIR Right 08/17/2020   Procedure: LAPAROSCOPIC RIGHT INGUINAL HERNIA REPAIR;  Surgeon: Axel Filler, MD;  Location: [facility] OR;  Service: General;  Laterality: Right;   INSERTION OF MESH  08/17/2020   Procedure:  INSERTION OF MESH;  Surgeon: Axel Filler, MD;  Location: [facility] OR;  Service: General;;   KIDNEY SURGERY     LIPOMA EXCISION Left 01/24/2023   Procedure: LEFT ELBOW LIPOMA REMOVAL;  Surgeon: Cammy Copa, MD;  Location: [facility] OR;  Service: Orthopedics;  Laterality: Left;   SHOULDER SURGERY Left    Social History   Occupational History   Occupation: retired  Tobacco Use   Smoking status: Former    Current packs/day: 0.00    Average packs/day: 1 pack/day for 35.0 years (35.0 ttl pk-yrs)    Types: Cigarettes    Start date: 06/02/1956    Quit date:  06/03/1991    Years since quitting: 32.0   Smokeless tobacco: Never  Vaping Use   Vaping status: Never Used  Substance and Sexual Activity   Alcohol use: Yes    Alcohol/week: 12.0 standard drinks of alcohol    Types: 12 Cans of beer per week    Comment: Drinks a beer daily   Drug use: No   Sexual activity: Not on file

## 2023-06-13 ENCOUNTER — Ambulatory Visit
Admission: RE | Admit: 2023-06-13 | Discharge: 2023-06-13 | Discharge status: Home / Self Care | Source: Ambulatory Visit | Attending: Orthopedic Surgery | Admitting: Orthopedic Surgery

## 2023-06-13 DIAGNOSIS — M419 Scoliosis, unspecified: Secondary | ICD-10-CM | POA: Diagnosis not present

## 2023-06-13 DIAGNOSIS — M79605 Pain in left leg: Secondary | ICD-10-CM

## 2023-06-13 DIAGNOSIS — M47816 Spondylosis without myelopathy or radiculopathy, lumbar region: Secondary | ICD-10-CM | POA: Diagnosis not present

## 2023-06-21 ENCOUNTER — Other Ambulatory Visit

## 2023-06-30 ENCOUNTER — Ambulatory Visit: Admitting: Surgical

## 2023-06-30 ENCOUNTER — Telehealth: Payer: Self-pay

## 2023-06-30 DIAGNOSIS — M79605 Pain in left leg: Secondary | ICD-10-CM | POA: Diagnosis not present

## 2023-06-30 DIAGNOSIS — M4807 Spinal stenosis, lumbosacral region: Secondary | ICD-10-CM | POA: Diagnosis not present

## 2023-06-30 NOTE — Telephone Encounter (Signed)
 Patient saw [facility] today. He is having severe pain and would like to be put on a cancellation list for ESI. If you can get patient in any sooner, please contact him.

## 2023-07-02 ENCOUNTER — Encounter: Payer: Self-pay | Admitting: Surgical

## 2023-07-02 NOTE — Progress Notes (Signed)
 Office Visit Note   Patient: Robert Bowers           Date of Birth: Nov 15, 1934           MRN: [id number] Visit Date: 06/30/2023 Requested by: Jeannine Milroy., MD [address] [place],  [place] [address] PCP: Jeannine Milroy., MD  Subjective: Chief Complaint  Patient presents with   Lower Back - Pain    MRI review    HPI: Robert Bowers is a 89 y.o. male who presents to the office for MRI review. Patient denies any changes in symptoms.  Continues to complain mainly of worsening left leg pain that radiates down the left leg to about the level of the ankle.  Worse with laying down.  It is somewhat tolerable during the day but it is severe at night and he is really not getting any significant amount of sleep over the last few weeks in particular.  Has tingling in the same distribution of his nerve pain.  No numbness.  Leg will give out on him.  Has no right-sided symptoms.  Takes Eliquis .  No history of prior back surgery.  No bladder incontinence.  No true bowel incontinence but he does note that several hours after he has a bowel movement, he will feel like he has some sensation of leakage and have to go wipe his buttocks again.  This is a new sensation that has been ongoing only over the last 4 months or so.  MRI results revealed: MR Lumbar Spine w/o contrast Result Date: 06/30/2023 CLINICAL DATA:  Back pain/lumbar symptoms.  Spinal stenosis. EXAM: MRI LUMBAR SPINE WITHOUT CONTRAST TECHNIQUE: Multiplanar, multisequence MR imaging of the lumbar spine was performed. No intravenous contrast was administered. COMPARISON:  Radiographs 06/01/2023 FINDINGS: Segmentation: The lowest lumbar type non-rib-bearing vertebra is labeled as L5. Alignment: Mild dextroconvex lumbar scoliosis with rotary component. No subluxation. Vertebrae: Schmorl's nodes posteriorly along the L4-5 endplates. Disc desiccation noted at L2-3, L3-4, and L5-S1 with loss of disc height at L3-4. Type 2 degenerative endplate  findings at L3-4. Conus medullaris and cauda equina: Conus extends to the L1 level. Conus and cauda equina appear normal. Paraspinal and other soft tissues: Fluid signal intensity renal cysts are observed or partially observed in both kidneys. No further imaging workup of these lesions is indicated. Disc levels: T12-L1: Unremarkable L1-2: Unremarkable L2-3: Borderline bilateral subarticular lateral recess stenosis due to disc bulge. L3-4: Moderate central narrowing of the thecal sac with moderate left and mild right subarticular lateral recess stenosis and mild bilateral foraminal stenosis due to short pedicles, disc bulge, and degenerative facet arthropathy along with ligamentum flavum redundancy. L4-5: Prominent central narrowing of the thecal sac noted with moderate to prominent bilateral subarticular lateral recess stenosis and mild right foraminal stenosis due to short pedicles, disc bulge, facet arthropathy, and ligamentum flavum redundancy. L5-S1: Moderate right foraminal stenosis and mild bilateral subarticular lateral recess stenosis due to disc bulge and facet arthropathy. IMPRESSION: 1. Lumbar spondylosis and degenerative disc disease, causing prominent impingement at L4-5 and moderate impingement at L3-4 and L5-S1. 2. Mild dextroconvex lumbar scoliosis with rotary component. Electronically Signed   By: Freida Jes M.D.   On: 06/30/2023 14:08                 ROS: All systems reviewed are negative as they relate to the chief complaint within the history of present illness.  Patient denies fevers or chills.  Assessment & Plan: Visit Diagnoses:  1. Pain in left leg   2. Spinal stenosis of lumbosacral region     Plan: Robert Bowers is a 89 y.o. male who presents to the office for review of MRI lumbar spine.  MRI demonstrates moderate impingement at multiple levels with severe central canal stenosis noted at L4-L5 with bilateral lateral recess stenosis.  Having primarily left leg radicular  pain that is getting to the point where it is quite unbearable for him.  He is at the point where he wants to consider surgical evaluation.  Plan refer patient to Dr. Sulema Endo who has done surgery on his wife.  We will also plan to set him up for lumbar spine ESI to get that ball rolling since he will need clearance to come off of his Eliquis  in case Dr. Sulema Endo wants to try that first.  No red flag symptoms but he is having some symptoms of anal leakage that has been noticeable in the last 4 months in a similar timeframe as his left leg pain.   Follow-Up Instructions: Return for After procedure.   Orders:  Orders Placed This Encounter  Procedures   Ambulatory referral to Physical Medicine Rehab   Ambulatory referral to Orthopedic Surgery   No orders of the defined types were placed in this encounter.     Procedures: No procedures performed   Clinical Data: No additional findings.  Objective: Vital Signs: There were no vitals taken for this visit.  Physical Exam:  Constitutional: Patient appears well-developed HEENT:  Head: Normocephalic Eyes:EOM are normal Neck: Normal range of motion Cardiovascular: Normal rate Pulmonary/chest: Effort normal Neurologic: Patient is alert Skin: Skin is warm Psychiatric: Patient has normal mood and affect  Ortho Exam: Ortho exam demonstrates intact hip flexion, quadricep, hamstring, dorsiflexion, plantarflexion, EHL rated 5/5 bilaterally.  No clonus noted bilaterally.  Positive straight leg raise on left, negative on right.  No pain with hip range of motion bilaterally.  Specialty Comments:  No specialty comments available.  Imaging: No results found.   PMFS History: Patient Active Problem List   Diagnosis Date Noted   Elbow mass, left 01/30/2023   Gastroesophageal reflux disease 03/26/2020   Globus sensation 03/26/2020   Aftercare following surgery of the circulatory system, NEC 07/23/2013   Occlusion and stenosis of carotid artery  without mention of cerebral infarction 06/07/2011   Past Medical History:  Diagnosis Date   Arthritis    Bilateral Hands   Carotid artery occlusion    Complication of anesthesia    difficulty awakening from anesthesia, & N & V   COPD (chronic obstructive pulmonary disease) (HCC)    GERD (gastroesophageal reflux disease)    Hyperlipidemia    Hypertension    PONV (postoperative nausea and vomiting)    Recurrent upper respiratory infection (URI)    last bout of bronchitis 04/2011.   Rupture of kidney    Shortness of breath    at times due to COPD.   Stroke [facility])    no deficits.    Family History  Problem Relation Age of Onset   Heart disease Mother        Heart Disease before age 56   Hypertension Mother    Hyperlipidemia Mother    Heart attack Mother    Varicose Veins Mother    Kidney disease Father    Anesthesia problems Neg Hx    Colon cancer Neg Hx     Past Surgical History:  Procedure Laterality Date   APPENDECTOMY  ELBOW SURGERY Left    x3   ENDARTERECTOMY  06/29/2011   Procedure: ENDARTERECTOMY CAROTID;  Surgeon: Palma Bob, MD;  Location: [facility] OR;  Service: Vascular;  Laterality: Left;   EYE SURGERY  Aug. and Dec. 2012   Detatched retina and Macular Deg.   EYE SURGERY Bilateral 03/31/2013   Eyelid / Eyebrow   INGUINAL HERNIA REPAIR Right 08/17/2020   Procedure: LAPAROSCOPIC RIGHT INGUINAL HERNIA REPAIR;  Surgeon: Shela Derby, MD;  Location: [facility] OR;  Service: General;  Laterality: Right;   INSERTION OF MESH  08/17/2020   Procedure: INSERTION OF MESH;  Surgeon: Shela Derby, MD;  Location: [facility] OR;  Service: General;;   KIDNEY SURGERY     LIPOMA EXCISION Left 01/24/2023   Procedure: LEFT ELBOW LIPOMA REMOVAL;  Surgeon: Jasmine Mesi, MD;  Location: [facility] OR;  Service: Orthopedics;  Laterality: Left;   SHOULDER SURGERY Left    Social History   Occupational History   Occupation: retired  Tobacco Use   Smoking status: Former    Current  packs/day: 0.00    Average packs/day: 1 pack/day for 35.0 years (35.0 ttl pk-yrs)    Types: Cigarettes    Start date: 06/02/1956    Quit date: 06/03/1991    Years since quitting: 32.1   Smokeless tobacco: Never  Vaping Use   Vaping status: Never Used  Substance and Sexual Activity   Alcohol  use: Yes    Alcohol /week: 12.0 standard drinks of alcohol     Types: 12 Cans of beer per week    Comment: Drinks a beer daily   Drug use: No   Sexual activity: Not on file

## 2023-07-03 ENCOUNTER — Telehealth: Payer: Self-pay

## 2023-07-03 NOTE — Telephone Encounter (Signed)
   Pre-operative Risk Assessment    Patient Name: Robert Bowers  DOB: March 27, 1934 MRN: [id number]   Date of last office visit: 01/20/23 Gaylyn Keas, MD Date of next office visit: NONE   Request for Surgical Clearance    Procedure:   EPIDURAL STEROID INJECTION  Date of Surgery:  Clearance TBD                                Surgeon:  DR Nettie Barb Group or Practice Name:  [facility] Phone number:  [phone] Fax number:  [phone] ATTN: DR Daisey Dryer SCHEDULING   Type of Clearance Requested:   - Medical  - Pharmacy:  Hold Apixaban  (Eliquis ) 3-5 DAYS PRIOR ( NOTED IN EPIC 01/25/23 NOT TAKING)   Type of Anesthesia:  Not Indicated   Additional requests/questions:    SignedCollin Deal   07/03/2023, 4:22 PM

## 2023-07-04 ENCOUNTER — Telehealth: Payer: Self-pay | Admitting: Physical Medicine and Rehabilitation

## 2023-07-04 DIAGNOSIS — I480 Paroxysmal atrial fibrillation: Secondary | ICD-10-CM | POA: Insufficient documentation

## 2023-07-04 DIAGNOSIS — D6869 Other thrombophilia: Secondary | ICD-10-CM | POA: Insufficient documentation

## 2023-07-04 DIAGNOSIS — M810 Age-related osteoporosis without current pathological fracture: Secondary | ICD-10-CM | POA: Insufficient documentation

## 2023-07-04 DIAGNOSIS — D692 Other nonthrombocytopenic purpura: Secondary | ICD-10-CM | POA: Insufficient documentation

## 2023-07-04 DIAGNOSIS — L209 Atopic dermatitis, unspecified: Secondary | ICD-10-CM | POA: Insufficient documentation

## 2023-07-04 DIAGNOSIS — N401 Enlarged prostate with lower urinary tract symptoms: Secondary | ICD-10-CM | POA: Insufficient documentation

## 2023-07-04 DIAGNOSIS — G4733 Obstructive sleep apnea (adult) (pediatric): Secondary | ICD-10-CM | POA: Insufficient documentation

## 2023-07-04 DIAGNOSIS — M8580 Other specified disorders of bone density and structure, unspecified site: Secondary | ICD-10-CM | POA: Insufficient documentation

## 2023-07-04 HISTORY — DX: Obstructive sleep apnea (adult) (pediatric): G47.33

## 2023-07-04 NOTE — Telephone Encounter (Signed)
 Patient with diagnosis of PAF on Eliquis  for anticoagulation.    Procedure: EPIDURAL STEROID INJECTION  Date of procedure: TBD   CHA2DS2-VASc Score = 6  This indicates a 9.7% annual risk of stroke. The patient's score is based upon: CHF History: 0 HTN History: 1 Diabetes History: 0 Stroke History: 2 Vascular Disease History: 1 Age Score: 2 Gender Score: 0   CrCl 52 ml/min Platelet count 124K    Per office protocol, patient can hold Eliquis  for 3 days prior to procedure.     **This guidance is not considered finalized until pre-operative APP has relayed final recommendations.**

## 2023-07-04 NOTE — Telephone Encounter (Signed)
 Please call patient to verify that he is [facility] patient and ask if he is taking Eliquis  or any other blood thinner.  Thank you, Moira Andrews

## 2023-07-04 NOTE — Telephone Encounter (Signed)
 Patient's wife Alois Arnt called and left message on voicemail wanting to speak to someone in Dr. Ona Bidding office regarding injections and her husband taking Eliquis .  Please call her at [phone].

## 2023-07-05 NOTE — Telephone Encounter (Signed)
 Primary Cardiologist:Traci Micael Adas, MD   Preoperative team, please contact this patient and set up a phone call appointment for further preoperative risk assessment. Please obtain consent and complete medication review. Thank you for your help.   I confirm that guidance regarding antiplatelet and oral anticoagulation therapy has been completed and, if necessary, noted below.  Per office protocol, patient can hold Eliquis  for 3 days prior to procedure.   I also confirmed the patient resides in the state of [place] . As per [facility] Medical Board telemedicine laws, the patient must reside in the state in which the provider is licensed.   Gerldine Koch, NP-C  07/05/2023, 7:47 AM [address], [address] [place], [place] [address] Office [phone] Fax [phone]

## 2023-07-05 NOTE — Telephone Encounter (Signed)
 S/W PT spouse and she stated that they no longer needed the clearance request from our office. She stated that they have been seeing a different Cardiologist office and got the clearance request through them.

## 2023-07-06 ENCOUNTER — Ambulatory Visit: Admitting: Physical Medicine and Rehabilitation

## 2023-07-06 ENCOUNTER — Other Ambulatory Visit: Payer: Self-pay

## 2023-07-06 VITALS — BP 176/89 | HR 76

## 2023-07-06 DIAGNOSIS — M5416 Radiculopathy, lumbar region: Secondary | ICD-10-CM | POA: Diagnosis not present

## 2023-07-06 MED ORDER — METHYLPREDNISOLONE ACETATE 40 MG/ML IJ SUSP
40.0000 mg | Freq: Once | INTRAMUSCULAR | Status: AC
  Administered 2023-07-06: 40 mg

## 2023-07-06 NOTE — Patient Instructions (Signed)

## 2023-07-06 NOTE — Progress Notes (Signed)
 Pain Scale   Average Pain 5 Patient advising he has severe pain in lower back at night and early AM/ Patient states as the day goes on his pain decreases with walking and moving. Patient does advise his pain does radiate to his Left leg.        +Driver, -BT, -Dye Allergies.

## 2023-07-10 NOTE — Procedures (Signed)
 Lumbosacral Transforaminal Epidural Steroid Injection - Sub-Pedicular Approach with Fluoroscopic Guidance  Patient: Robert Bowers      Date of Birth: January 23, 1935 MRN: [id number] PCP: Jeannine Milroy., MD      Visit Date: 07/06/2023   Universal Protocol:    Date/Time: 07/06/2023  Consent Given By: the patient  Position: PRONE  Additional Comments: Vital signs were monitored before and after the procedure. Patient was prepped and draped in the usual sterile fashion. The correct patient, procedure, and site was verified.   Injection Procedure Details:   Procedure diagnoses: Lumbar radiculopathy [M54.16]    Meds Administered:  Meds ordered this encounter  Medications   methylPREDNISolone  acetate (DEPO-MEDROL ) injection 40 mg    Laterality: Left  Location/Site: L4  Needle:5.0 in., 22 ga.  Short bevel or Quincke spinal needle  Needle Placement: Transforaminal  Findings:    -Comments: Excellent flow of contrast along the nerve, nerve root and into the epidural space.  Procedure Details: After squaring off the end-plates to get a true AP view, the C-arm was positioned so that an oblique view of the foramen as noted above was visualized. The target area is just inferior to the "nose of the scotty dog" or sub pedicular. The soft tissues overlying this structure were infiltrated with 2-3 ml. of 1% Lidocaine  without Epinephrine.  The spinal needle was inserted toward the target using a "trajectory" view along the fluoroscope beam.  Under AP and lateral visualization, the needle was advanced so it did not puncture dura and was located close the 6 O'Clock position of the pedical in AP tracterory. Biplanar projections were used to confirm position. Aspiration was confirmed to be negative for CSF and/or blood. A 1-2 ml. volume of Isovue-250 was injected and flow of contrast was noted at each level. Radiographs were obtained for documentation purposes.   After attaining the desired  flow of contrast documented above, a 0.5 to 1.0 ml test dose of 0.25% Marcaine  was injected into each respective transforaminal space.  The patient was observed for 90 seconds post injection.  After no sensory deficits were reported, and normal lower extremity motor function was noted,   the above injectate was administered so that equal amounts of the injectate were placed at each foramen (level) into the transforaminal epidural space.   Additional Comments:  The patient tolerated the procedure well Dressing: 2 x 2 sterile gauze and Band-Aid    Post-procedure details: Patient was observed during the procedure. Post-procedure instructions were reviewed.  Patient left the clinic in stable condition.

## 2023-07-10 NOTE — Progress Notes (Signed)
 Robert Bowers - 89 y.o. male MRN [id number]  Date of birth: 03/27/1934  Office Visit Note: Visit Date: 07/06/2023 PCP: Jeannine Milroy., MD Referred by: Jeannine Milroy., MD  Subjective: Chief Complaint  Patient presents with   Lower Back - Pain   HPI:  Robert Bowers is a 89 y.o. male who comes in today at the request of Prentis Brock, PA-C for planned Left L4-5 Lumbar Transforaminal epidural steroid injection with fluoroscopic guidance.  The patient has failed conservative care including home exercise, medications, time and activity modification.  This injection will be diagnostic and hopefully therapeutic.  Please see requesting physician notes for further details and justification.    ROS Otherwise per HPI.  Assessment & Plan: Visit Diagnoses:    ICD-10-CM   1. Lumbar radiculopathy  M54.16 XR C-ARM NO REPORT    Epidural Steroid injection    methylPREDNISolone  acetate (DEPO-MEDROL ) injection 40 mg      Plan: No additional findings.   Meds & Orders:  Meds ordered this encounter  Medications   methylPREDNISolone  acetate (DEPO-MEDROL ) injection 40 mg    Orders Placed This Encounter  Procedures   XR C-ARM NO REPORT   Epidural Steroid injection    Follow-up: Return for visit to requesting provider as needed.   Procedures: No procedures performed  Lumbosacral Transforaminal Epidural Steroid Injection - Sub-Pedicular Approach with Fluoroscopic Guidance  Patient: Robert Bowers      Date of Birth: [birth year] MRN: [id number] PCP: Jeannine Milroy., MD      Visit Date: 07/06/2023   Universal Protocol:    Date/Time: 07/06/2023  Consent Given By: the patient  Position: PRONE  Additional Comments: Vital signs were monitored before and after the procedure. Patient was prepped and draped in the usual sterile fashion. The correct patient, procedure, and site was verified.   Injection Procedure Details:   Procedure diagnoses: Lumbar radiculopathy [M54.16]     Meds Administered:  Meds ordered this encounter  Medications   methylPREDNISolone  acetate (DEPO-MEDROL ) injection 40 mg    Laterality: Left  Location/Site: L4  Needle:5.0 in., 22 ga.  Short bevel or Quincke spinal needle  Needle Placement: Transforaminal  Findings:    -Comments: Excellent flow of contrast along the nerve, nerve root and into the epidural space.  Procedure Details: After squaring off the end-plates to get a true AP view, the C-arm was positioned so that an oblique view of the foramen as noted above was visualized. The target area is just inferior to the "nose of the scotty dog" or sub pedicular. The soft tissues overlying this structure were infiltrated with 2-3 ml. of 1% Lidocaine  without Epinephrine.  The spinal needle was inserted toward the target using a "trajectory" view along the fluoroscope beam.  Under AP and lateral visualization, the needle was advanced so it did not puncture dura and was located close the 6 O'Clock position of the pedical in AP tracterory. Biplanar projections were used to confirm position. Aspiration was confirmed to be negative for CSF and/or blood. A 1-2 ml. volume of Isovue-250 was injected and flow of contrast was noted at each level. Radiographs were obtained for documentation purposes.   After attaining the desired flow of contrast documented above, a 0.5 to 1.0 ml test dose of 0.25% Marcaine  was injected into each respective transforaminal space.  The patient was observed for 90 seconds post injection.  After no sensory deficits were reported, and normal lower extremity motor function was noted,  the above injectate was administered so that equal amounts of the injectate were placed at each foramen (level) into the transforaminal epidural space.   Additional Comments:  The patient tolerated the procedure well Dressing: 2 x 2 sterile gauze and Band-Aid    Post-procedure details: Patient was observed during the  procedure. Post-procedure instructions were reviewed.  Patient left the clinic in stable condition.    Clinical History: CLINICAL DATA:  Back pain/lumbar symptoms.  Spinal stenosis.   EXAM: MRI LUMBAR SPINE WITHOUT CONTRAST   TECHNIQUE: Multiplanar, multisequence MR imaging of the lumbar spine was performed. No intravenous contrast was administered.   COMPARISON:  Radiographs 06/01/2023   FINDINGS: Segmentation: The lowest lumbar type non-rib-bearing vertebra is labeled as L5.   Alignment: Mild dextroconvex lumbar scoliosis with rotary component. No subluxation.   Vertebrae: Schmorl's nodes posteriorly along the L4-5 endplates. Disc desiccation noted at L2-3, L3-4, and L5-S1 with loss of disc height at L3-4. Type 2 degenerative endplate findings at L3-4.   Conus medullaris and cauda equina: Conus extends to the L1 level. Conus and cauda equina appear normal.   Paraspinal and other soft tissues: Fluid signal intensity renal cysts are observed or partially observed in both kidneys. No further imaging workup of these lesions is indicated.   Disc levels:   T12-L1: Unremarkable   L1-2: Unremarkable   L2-3: Borderline bilateral subarticular lateral recess stenosis due to disc bulge.   L3-4: Moderate central narrowing of the thecal sac with moderate left and mild right subarticular lateral recess stenosis and mild bilateral foraminal stenosis due to short pedicles, disc bulge, and degenerative facet arthropathy along with ligamentum flavum redundancy.   L4-5: Prominent central narrowing of the thecal sac noted with moderate to prominent bilateral subarticular lateral recess stenosis and mild right foraminal stenosis due to short pedicles, disc bulge, facet arthropathy, and ligamentum flavum redundancy.   L5-S1: Moderate right foraminal stenosis and mild bilateral subarticular lateral recess stenosis due to disc bulge and facet arthropathy.   IMPRESSION: 1. Lumbar  spondylosis and degenerative disc disease, causing prominent impingement at L4-5 and moderate impingement at L3-4 and L5-S1. 2. Mild dextroconvex lumbar scoliosis with rotary component.     Electronically Signed   By: Freida Jes M.D.   On: 06/30/2023 14:08     Objective:  VS:  HT:    WT:   BMI:     BP:(!) 176/89  HR:76bpm  TEMP: ( )  RESP:  Physical Exam Vitals and nursing note reviewed.  Constitutional:      General: He is not in acute distress.    Appearance: Normal appearance. He is not ill-appearing.  HENT:     Head: Normocephalic and atraumatic.     Right Ear: External ear normal.     Left Ear: External ear normal.     Nose: No congestion.  Eyes:     Extraocular Movements: Extraocular movements intact.  Cardiovascular:     Rate and Rhythm: Normal rate.     Pulses: Normal pulses.  Pulmonary:     Effort: Pulmonary effort is normal. No respiratory distress.  Abdominal:     General: There is no distension.     Palpations: Abdomen is soft.  Musculoskeletal:        General: No tenderness or signs of injury.     Cervical back: Neck supple.     Right lower leg: No edema.     Left lower leg: No edema.     Comments: Patient has good distal  strength without clonus.  Skin:    Findings: No erythema or rash.  Neurological:     General: No focal deficit present.     Mental Status: He is alert and oriented to person, place, and time.     Sensory: No sensory deficit.     Motor: No weakness or abnormal muscle tone.     Coordination: Coordination normal.  Psychiatric:        Mood and Affect: Mood normal.        Behavior: Behavior normal.      Imaging: No results found.

## 2023-07-18 ENCOUNTER — Other Ambulatory Visit (INDEPENDENT_AMBULATORY_CARE_PROVIDER_SITE_OTHER)

## 2023-07-18 ENCOUNTER — Ambulatory Visit: Admitting: Orthopedic Surgery

## 2023-07-18 VITALS — BP 123/74 | HR 81 | Ht 72.0 in | Wt 167.0 lb

## 2023-07-18 DIAGNOSIS — M5416 Radiculopathy, lumbar region: Secondary | ICD-10-CM

## 2023-07-18 DIAGNOSIS — M545 Low back pain, unspecified: Secondary | ICD-10-CM

## 2023-07-18 NOTE — Progress Notes (Signed)
 Orthopedic Spine Surgery Office Note  Assessment: Patient is a 89 y.o. male with low back pain that radiates into his legs. Improves with flexed posture of the lumbar spine consistent with neurogenic claudication. 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 Discussed that the conservative treatments include:  -activity modification  -physical therapy  -over the counter pain medications  -medrol  dosepak  -gabapentin/lyrica  -lumbar steroid injections -Patient has tried tylenol , aleve, tramadol , lumbar steroid injection -Talked about remaining options from PT as least invasive to surgery. He has been dealing with this for 5 months and it has been interfering with his ability to do simple tasks like doing dishes or cooking, so he was interested in surgery.  Discussed laminectomy as a treatment option for him.  After this discussion, he elected to proceed -Will next see at date of surgery   The patient has symptoms consistent with neurogenic claudication. The patient's symptoms were not improving with conservative treatment so operative management was discussed in the form of L4 and L5 segment laminectomies with partial medial facetectomies. The risks including but not limited to iatrogenic instability, dural tear, nerve root injury, paralysis, persistent pain, infection, bleeding, heart attack, death, fracture, dvt/pe, and need for additional procedures were discussed with the patient. The benefit of the surgery would be improvement in the patient's radiating leg pain. I explained that back pain relief is not the goal of the surgery and it is not reliably alleviated with this surgery. The alternatives to surgical management were covered with the patient and included continued monitoring, physical therapy, over-the-counter pain medications, ambulatory aids, repeat  injections, and activity modification. All the patient's questions were answered to his and his wife's satisfaction. After this discussion, the patient expressed understanding and elected to proceed with surgical intervention.     ___________________________________________________________________________   History:  Patient is a 89 y.o. male who presents today for lumbar spine.  Patient reports a history of chronic low back pain that he has had for years, but within the last 5 months he has noticed new symptoms.  He said he has felt worsening back pain and pain into his bilateral lower extremities.  On the left side he feels a going into the posterolateral aspect of the thigh and leg.  On the right side, he feels it in the buttock.  He notices the pain with walking and standing.  It gets better if he sits down.  He also finds himself leaning over a shopping cart at the store to help with the pain. He used to be able to do the dishes and cook but now is having difficulty doing it because he has to frequently sit down to help with the pain.  There was no trauma or injury that preceded the onset of this worsening pain.  He did get an injection with Dr. Daisey Dryer which was helpful with his leg pain.   Weakness: Yes, generally feels weak and tired. No specific weakness though Symptoms of imbalance: Denies Paresthesias and numbness: Denies Bowel or bladder incontinence: Denies Saddle anesthesia: Denies  Treatments tried: tylenol , aleve, tramadol , lumbar steroid injection  Review of systems: Denies fevers and chills, night sweats, unexplained weight loss, history of cancer. Has had pain that wakes him at night.  Past medical history: CKD HTN HLD GERD OSA TIA COPD Chronic low back pain  Allergies: losartan , vicodin, ACEi, latex, adhesive tape  Past  surgical history:  Appendectomy Hernia repair Endarterectomy Retina repair Left shoulder surgery  Social history: Denies use of nicotine  product (smoking, vaping, patches, smokeless) Alcohol  use: Yes, approximately 1 drink per day Denies recreational drug use   Physical Exam:  BMI of 22.7  General: no acute distress, appears stated age Neurologic: alert, answering questions appropriately, following commands Respiratory: unlabored breathing on room air, symmetric chest rise Psychiatric: appropriate affect, normal cadence to speech   MSK (spine):  -Strength exam      Left  Right EHL    5/5  5/5 TA    5/5  5/5 GSC    5/5  5/5 Knee extension  5/5  5/5 Hip flexion   5/5  5/5  -Sensory exam    Sensation intact to light touch in L2-S1 nerve distributions of bilateral lower extremities  -Achilles DTR: 2/4 on the left, 2/4 on the right -Patellar tendon DTR: 2/4 on the left, 2/4 on the right  -Straight leg raise: negative bilaterally -Femoral nerve stretch test: negative bilaterally -Clonus: no beats bilaterally -Palpable DP pulses bilaterally  -Left hip exam: no pain through range of motion, negative stinchfield, negative FABER -Right hip exam: no pain through range of motion, negative stinchfield, negative FABER  Imaging: XRs of the lumbar spine from 07/18/2023 were independently reviewed and interpreted, showing disc height loss at L3/4. No other significant stenosis seen. No evidence of instability on flexion/extension views. Lumbar coronal curvature that measures 12 degrees with apex to the right at L3. No fracture or dislocation seen.   MRI of the lumbar spine from 06/13/2023 was independently reviewed and interpreted, showing central and lateral recess stenosis at L4/5. No other significant stenosis seen.   Patient name: Robert Bowers Patient MRN: [id number] Date of visit: 07/18/23    Pre-operative Scores  ODI: 58% VAS leg: 10/10 VAS back: 7/10

## 2023-08-01 NOTE — Pre-Procedure Instructions (Signed)
 Surgical Instructions   Your procedure is scheduled on August 11, 2023. Report to [facility] Main Entrance "A" at 5:30 A.M., then check in with the Admitting office. Any questions or running late day of surgery: call [phone]  Questions prior to your surgery date: call [phone], Monday-Friday, 8am-4pm. If you experience any cold or flu symptoms such as cough, fever, chills, shortness of breath, etc. between now and your scheduled surgery, please notify us  at the above number.     Remember:  Do not eat after midnight the night before your surgery  You may drink clear liquids until 4:30 AM the morning of your surgery.   Clear liquids allowed are: Water, Non-Citrus Juices (without pulp), Carbonated Beverages, Clear Tea (no milk, honey, etc.), Black Coffee Only (NO MILK, CREAM OR POWDERED CREAMER of any kind), and Gatorade.  Patient Instructions  The night before surgery:  No food after midnight. ONLY clear liquids after midnight  The day of surgery (if you do NOT have diabetes):  Drink ONE (1) Pre-Surgery Clear Ensure by 4:30 AM the morning of surgery. Drink in one sitting. Do not sip.  This drink was given to you during your hospital  pre-op appointment visit.  Nothing else to drink after completing the  Pre-Surgery Clear Ensure.         If you have questions, please contact your surgeon's office.    Take these medicines the morning of surgery with A SIP OF WATER: atorvastatin (LIPITOR)  famotidine  (PEPCID )  nebivolol (BYSTOLIC)  omeprazole  (PRILOSEC)  prednisoLONE acetate (PRED FORTE) ophthalmic suspension  tamsulosin (FLOMAX)  Tiotropium Bromide-Olodaterol (STIOLTO RESPIMAT) Inhaler   May take these medicines IF NEEDED: acetaminophen  (TYLENOL )    STOP taking your apixaban  (ELIQUIS ) two days prior to surgery. Your last dose will be June 10th.   One week prior to surgery, STOP taking any Aspirin  (unless otherwise instructed by your surgeon) Aleve, Naproxen,  Ibuprofen, Motrin, Advil, Goody's, BC's, all herbal medications, fish oil, and non-prescription vitamins.                     Do NOT Smoke (Tobacco/Vaping) for 24 hours prior to your procedure.  If you use a CPAP at night, you may bring your mask/headgear for your overnight stay.   You will be asked to remove any contacts, glasses, piercing's, hearing aid's, dentures/partials prior to surgery. Please bring cases for these items if needed.    Patients discharged the day of surgery will not be allowed to drive home, and someone needs to stay with them for 24 hours.  SURGICAL WAITING ROOM VISITATION Patients may have no more than 2 support people in the waiting area - these visitors may rotate.   Pre-op nurse will coordinate an appropriate time for 1 ADULT support person, who may not rotate, to accompany patient in pre-op.  Children under the age of 57 must have an adult with them who is not the patient and must remain in the main waiting area with an adult.  If the patient needs to stay at the hospital during part of their recovery, the visitor guidelines for inpatient rooms apply.  Please refer to the [facility] website for the visitor guidelines for any additional information.   If you received a COVID test during your pre-op visit  it is requested that you wear a mask when out in public, stay away from anyone that may not be feeling well and notify your surgeon if you develop symptoms. If you have  been in contact with anyone that has tested positive in the last 10 days please notify you surgeon.      Pre-operative 5 CHG Bathing Instructions   You can play a key role in reducing the risk of infection after surgery. Your skin needs to be as free of germs as possible. You can reduce the number of germs on your skin by washing with CHG (chlorhexidine  gluconate) soap before surgery. CHG is an antiseptic soap that kills germs and continues to kill germs even after washing.   DO NOT use if  you have an allergy  to chlorhexidine /CHG or antibacterial soaps. If your skin becomes reddened or irritated, stop using the CHG and notify one of our RNs at [phone].   Please shower with the CHG soap starting 4 days before surgery using the following schedule:     Please keep in mind the following:  DO NOT shave, including legs and underarms, starting the day of your first shower.   You may shave your face at any point before/day of surgery.  Place clean sheets on your bed the day you start using CHG soap. Use a clean washcloth (not used since being washed) for each shower. DO NOT sleep with pets once you start using the CHG.   CHG Shower Instructions:  Wash your face and private area with normal soap. If you choose to wash your hair, wash first with your normal shampoo.  After you use shampoo/soap, rinse your hair and body thoroughly to remove shampoo/soap residue.  Turn the water OFF and apply about 3 tablespoons (45 ml) of CHG soap to a CLEAN washcloth.  Apply CHG soap ONLY FROM YOUR NECK DOWN TO YOUR TOES (washing for 3-5 minutes)  DO NOT use CHG soap on face, private areas, open wounds, or sores.  Pay special attention to the area where your surgery is being performed.  If you are having back surgery, having someone wash your back for you may be helpful. Wait 2 minutes after CHG soap is applied, then you may rinse off the CHG soap.  Pat dry with a clean towel  Put on clean clothes/pajamas   If you choose to wear lotion, please use ONLY the CHG-compatible lotions that are listed below.  Additional instructions for the day of surgery: DO NOT APPLY any lotions, deodorants, cologne, or perfumes.   Do not bring valuables to the hospital. [facility] is not responsible for any belongings/valuables. Do not wear nail polish, gel polish, artificial nails, or any other type of covering on natural nails (fingers and toes) Do not wear jewelry or makeup Put on clean/comfortable clothes.   Please brush your teeth.  Ask your nurse before applying any prescription medications to the skin.     CHG Compatible Lotions   Aveeno Moisturizing lotion  Cetaphil Moisturizing Cream  Cetaphil Moisturizing Lotion  Clairol Herbal Essence Moisturizing Lotion, Dry Skin  Clairol Herbal Essence Moisturizing Lotion, Extra Dry Skin  Clairol Herbal Essence Moisturizing Lotion, Normal Skin  Curel Age Defying Therapeutic Moisturizing Lotion with Alpha Hydroxy  Curel Extreme Care Body Lotion  Curel Soothing Hands Moisturizing Hand Lotion  Curel Therapeutic Moisturizing Cream, Fragrance-Free  Curel Therapeutic Moisturizing Lotion, Fragrance-Free  Curel Therapeutic Moisturizing Lotion, Original Formula  Eucerin Daily Replenishing Lotion  Eucerin Dry Skin Therapy Plus Alpha Hydroxy Crme  Eucerin Dry Skin Therapy Plus Alpha Hydroxy Lotion  Eucerin Original Crme  Eucerin Original Lotion  Eucerin Plus Crme Eucerin Plus Lotion  Eucerin TriLipid Replenishing Lotion  Keri  Anti-Bacterial Hand Lotion  Keri Deep Conditioning Original Lotion Dry Skin Formula Softly Scented  Keri Deep Conditioning Original Lotion, Fragrance Free Sensitive Skin Formula  Keri Lotion Fast Absorbing Fragrance Free Sensitive Skin Formula  Keri Lotion Fast Absorbing Softly Scented Dry Skin Formula  Keri Original Lotion  Keri Skin Renewal Lotion Keri Silky Smooth Lotion  Keri Silky Smooth Sensitive Skin Lotion  Nivea Body Creamy Conditioning Oil  Nivea Body Extra Enriched Lotion  Nivea Body Original Lotion  Nivea Body Sheer Moisturizing Lotion Nivea Crme  Nivea Skin Firming Lotion  NutraDerm 30 Skin Lotion  NutraDerm Skin Lotion  NutraDerm Therapeutic Skin Cream  NutraDerm Therapeutic Skin Lotion  ProShield Protective Hand Cream  Provon moisturizing lotion  Please read over the following fact sheets that you were given.

## 2023-08-02 ENCOUNTER — Encounter (HOSPITAL_COMMUNITY): Payer: Self-pay

## 2023-08-02 ENCOUNTER — Encounter (HOSPITAL_COMMUNITY)
Admission: RE | Admit: 2023-08-02 | Discharge: 2023-08-02 | Disposition: A | Discharge status: Home / Self Care | Source: Ambulatory Visit | Attending: Orthopedic Surgery | Admitting: Orthopedic Surgery

## 2023-08-02 ENCOUNTER — Other Ambulatory Visit: Payer: Self-pay

## 2023-08-02 VITALS — BP 175/80 | HR 72 | Temp 98.2°F | Resp 18 | Ht 72.0 in | Wt 169.8 lb

## 2023-08-02 DIAGNOSIS — Z7901 Long term (current) use of anticoagulants: Secondary | ICD-10-CM | POA: Diagnosis not present

## 2023-08-02 DIAGNOSIS — M48062 Spinal stenosis, lumbar region with neurogenic claudication: Secondary | ICD-10-CM | POA: Diagnosis not present

## 2023-08-02 DIAGNOSIS — Z9889 Other specified postprocedural states: Secondary | ICD-10-CM | POA: Insufficient documentation

## 2023-08-02 DIAGNOSIS — I129 Hypertensive chronic kidney disease with stage 1 through stage 4 chronic kidney disease, or unspecified chronic kidney disease: Secondary | ICD-10-CM | POA: Insufficient documentation

## 2023-08-02 DIAGNOSIS — G4733 Obstructive sleep apnea (adult) (pediatric): Secondary | ICD-10-CM | POA: Diagnosis not present

## 2023-08-02 DIAGNOSIS — Z01818 Encounter for other preprocedural examination: Secondary | ICD-10-CM

## 2023-08-02 DIAGNOSIS — N189 Chronic kidney disease, unspecified: Secondary | ICD-10-CM | POA: Insufficient documentation

## 2023-08-02 DIAGNOSIS — E785 Hyperlipidemia, unspecified: Secondary | ICD-10-CM | POA: Insufficient documentation

## 2023-08-02 DIAGNOSIS — I4891 Unspecified atrial fibrillation: Secondary | ICD-10-CM | POA: Insufficient documentation

## 2023-08-02 DIAGNOSIS — J449 Chronic obstructive pulmonary disease, unspecified: Secondary | ICD-10-CM | POA: Diagnosis not present

## 2023-08-02 DIAGNOSIS — K219 Gastro-esophageal reflux disease without esophagitis: Secondary | ICD-10-CM | POA: Insufficient documentation

## 2023-08-02 DIAGNOSIS — Z01812 Encounter for preprocedural laboratory examination: Secondary | ICD-10-CM | POA: Insufficient documentation

## 2023-08-02 DIAGNOSIS — I6523 Occlusion and stenosis of bilateral carotid arteries: Secondary | ICD-10-CM | POA: Insufficient documentation

## 2023-08-02 DIAGNOSIS — Z8673 Personal history of transient ischemic attack (TIA), and cerebral infarction without residual deficits: Secondary | ICD-10-CM | POA: Diagnosis not present

## 2023-08-02 LAB — CBC
HCT: 45 % (ref 39.0–52.0)
Hemoglobin: 15 g/dL (ref 13.0–17.0)
MCH: 29.9 pg (ref 26.0–34.0)
MCHC: 33.3 g/dL (ref 30.0–36.0)
MCV: 89.6 fL (ref 80.0–100.0)
Platelets: 124 10*3/uL — ABNORMAL LOW (ref 150–400)
RBC: 5.02 MIL/uL (ref 4.22–5.81)
RDW: 13 % (ref 11.5–15.5)
WBC: 4.1 10*3/uL (ref 4.0–10.5)
nRBC: 0 % (ref 0.0–0.2)

## 2023-08-02 LAB — SURGICAL PCR SCREEN
MRSA, PCR: NEGATIVE
Staphylococcus aureus: NEGATIVE

## 2023-08-02 LAB — BASIC METABOLIC PANEL WITH GFR
Anion gap: 6 (ref 5–15)
BUN: 15 mg/dL (ref 8–23)
CO2: 27 mmol/L (ref 22–32)
Calcium: 9.1 mg/dL (ref 8.9–10.3)
Chloride: 107 mmol/L (ref 98–111)
Creatinine, Ser: 1.28 mg/dL — ABNORMAL HIGH (ref 0.61–1.24)
GFR, Estimated: 55 mL/min — ABNORMAL LOW (ref >60)
Glucose, Bld: 94 mg/dL (ref 70–99)
Potassium: 4 mmol/L (ref 3.5–5.1)
Sodium: 140 mmol/L (ref 135–145)

## 2023-08-02 NOTE — Progress Notes (Signed)
 PCP - Dr. Jeannine Milroy. Cardiologist - Dr. Alric Asp LOV 05-16-23, follow up in 3 months PPM/ICD - Denies Device Orders - n/a Rep Notified - n/a  Chest x-ray - n/a EKG - 05-16-23 CE Stress Test - Denies ECHO - 02-02-23 Cardiac Cath - denies  Sleep Study - mild sleep apnea 01/2023 CPAP - Denies  Fasting Blood Sugar - Denies Checks Blood Sugar _____ times a day: N/A  Last dose of GLP1 agonist-  Denies GLP1 instructions: N/A  Blood Thinner Instructions: Eliquis  - last dose on 08/08/23 Aspirin  Instructions: Denies  ERAS Protcol - Clears till 0430 PRE-SURGERY Ensure or G2- n/a   COVID TEST- n/a   Anesthesia review: Yes, cardiac clearance  Patient denies shortness of breath, fever, cough and chest pain at PAT appointment   All instructions explained to the patient, with a verbal understanding of the material. Patient agrees to go over the instructions while at home for a better understanding. Patient also instructed to self quarantine after being tested for COVID-19. The opportunity to ask questions was provided.

## 2023-08-02 NOTE — Progress Notes (Addendum)
 Surgical Instructions     Your procedure is scheduled on August 11, 2023. Report to [facility] Main Entrance "A" at 5:30 A.M., then check in with the Admitting office. Any questions or running late day of surgery: call [phone]   Questions prior to your surgery date: call [phone], Monday-Friday, 8am-4pm. If you experience any cold or flu symptoms such as cough, fever, chills, shortness of breath, etc. between now and your scheduled surgery, please notify us  at the above number.            Remember:       Do not eat after midnight the night before your surgery   You may drink clear liquids until 4:30 AM the morning of your surgery.   Clear liquids allowed are: Water, Non-Citrus Juices (without pulp), Carbonated Beverages, Clear Tea (no milk, honey, etc.), Black Coffee Only (NO MILK, CREAM OR POWDERED CREAMER of any kind), and Gatorade.   Patient Instructions   The night before surgery:  No food after midnight. ONLY clear liquids after midnight   The day of surgery (if you do NOT have diabetes):  Drink ONE (1) Pre-Surgery Clear Ensure by 4:30 AM the morning of surgery. Drink in one sitting. Do not sip.  This drink was given to you during your hospital pre-op appointment visit.   Nothing else to drink after completing the Pre-Surgery Clear Ensure.          If you have questions, please contact your surgeon's office.          Take these medicines the morning of surgery with A SIP OF WATER:  famotidine  (PEPCID )  omeprazole  (PRILOSEC)  prednisoLONE acetate (PRED FORTE) ophthalmic suspension  tamsulosin (FLOMAX)  Tiotropium Bromide-Olodaterol (STIOLTO RESPIMAT) Inhaler     May take these medicines IF NEEDED: acetaminophen  (TYLENOL )      STOP taking your apixaban  (ELIQUIS ) two days prior to surgery. Your last dose will be June 10th.     One week prior to surgery, STOP taking any Aspirin  (unless otherwise instructed by your surgeon) Aleve, Naproxen, Ibuprofen, Motrin,  Advil, Goody's, BC's, all herbal medications, fish oil, and non-prescription vitamins.                     Do NOT Smoke (Tobacco/Vaping) for 24 hours prior to your procedure.   If you use a CPAP at night, you may bring your mask/headgear for your overnight stay.   You will be asked to remove any contacts, glasses, piercing's, hearing aid's, dentures/partials prior to surgery. Please bring cases for these items if needed.    Patients discharged the day of surgery will not be allowed to drive home, and someone needs to stay with them for 24 hours.   SURGICAL WAITING ROOM VISITATION Patients may have no more than 2 support people in the waiting area - these visitors may rotate.   Pre-op nurse will coordinate an appropriate time for 1 ADULT support person, who may not rotate, to accompany patient in pre-op.  Children under the age of 21 must have an adult with them who is not the patient and must remain in the main waiting area with an adult.   If the patient needs to stay at the hospital during part of their recovery, the visitor guidelines for inpatient rooms apply.   Please refer to the [facility] website for the visitor guidelines for any additional information.     If you received a COVID test during your pre-op visit  it  is requested that you wear a mask when out in public, stay away from anyone that may not be feeling well and notify your surgeon if you develop symptoms. If you have been in contact with anyone that has tested positive in the last 10 days please notify you surgeon.         Pre-operative 5 CHG Bathing Instructions    You can play a key role in reducing the risk of infection after surgery. Your skin needs to be as free of germs as possible. You can reduce the number of germs on your skin by washing with CHG (chlorhexidine  gluconate) soap before surgery. CHG is an antiseptic soap that kills germs and continues to kill germs even after washing.    DO NOT use if you have  an allergy  to chlorhexidine /CHG or antibacterial soaps. If your skin becomes reddened or irritated, stop using the CHG and notify one of our RNs at [phone].    Please shower with the CHG soap starting 4 days before surgery using the following schedule:       Please keep in mind the following:  DO NOT shave, including legs and underarms, starting the day of your first shower.   You may shave your face at any point before/day of surgery.  Place clean sheets on your bed the day you start using CHG soap. Use a clean washcloth (not used since being washed) for each shower. DO NOT sleep with pets once you start using the CHG.    CHG Shower Instructions:  Wash your face and private area with normal soap. If you choose to wash your hair, wash first with your normal shampoo.  After you use shampoo/soap, rinse your hair and body thoroughly to remove shampoo/soap residue.  Turn the water OFF and apply about 3 tablespoons (45 ml) of CHG soap to a CLEAN washcloth.  Apply CHG soap ONLY FROM YOUR NECK DOWN TO YOUR TOES (washing for 3-5 minutes)  DO NOT use CHG soap on face, private areas, open wounds, or sores.  Pay special attention to the area where your surgery is being performed.  If you are having back surgery, having someone wash your back for you may be helpful. Wait 2 minutes after CHG soap is applied, then you may rinse off the CHG soap.  Pat dry with a clean towel  Put on clean clothes/pajamas   If you choose to wear lotion, please use ONLY the CHG-compatible lotions that are listed below.   Additional instructions for the day of surgery: DO NOT APPLY any lotions, deodorants, cologne, or perfumes.   Do not bring valuables to the hospital. [facility] is not responsible for any belongings/valuables. Do not wear nail polish, gel polish, artificial nails, or any other type of covering on natural nails (fingers and toes) Do not wear jewelry or makeup Put on clean/comfortable clothes.   Please brush your teeth.  Ask your nurse before applying any prescription medications to the skin.        CHG Compatible Lotions    Aveeno Moisturizing lotion  Cetaphil Moisturizing Cream  Cetaphil Moisturizing Lotion  Clairol Herbal Essence Moisturizing Lotion, Dry Skin  Clairol Herbal Essence Moisturizing Lotion, Extra Dry Skin  Clairol Herbal Essence Moisturizing Lotion, Normal Skin  Curel Age Defying Therapeutic Moisturizing Lotion with Alpha Hydroxy  Curel Extreme Care Body Lotion  Curel Soothing Hands Moisturizing Hand Lotion  Curel Therapeutic Moisturizing Cream, Fragrance-Free  Curel Therapeutic Moisturizing Lotion, Fragrance-Free  Curel Therapeutic Moisturizing Lotion, Original  Formula  Eucerin Daily Replenishing Lotion  Eucerin Dry Skin Therapy Plus Alpha Hydroxy Crme  Eucerin Dry Skin Therapy Plus Alpha Hydroxy Lotion  Eucerin Original Crme  Eucerin Original Lotion  Eucerin Plus Crme Eucerin Plus Lotion  Eucerin TriLipid Replenishing Lotion  Keri Anti-Bacterial Hand Lotion  Keri Deep Conditioning Original Lotion Dry Skin Formula Softly Scented  Keri Deep Conditioning Original Lotion, Fragrance Free Sensitive Skin Formula  Keri Lotion Fast Absorbing Fragrance Free Sensitive Skin Formula  Keri Lotion Fast Absorbing Softly Scented Dry Skin Formula  Keri Original Lotion  Keri Skin Renewal Lotion Keri Silky Smooth Lotion  Keri Silky Smooth Sensitive Skin Lotion  Nivea Body Creamy Conditioning Oil  Nivea Body Extra Enriched Lotion  Nivea Body Original Lotion  Nivea Body Sheer Moisturizing Lotion Nivea Crme  Nivea Skin Firming Lotion  NutraDerm 30 Skin Lotion  NutraDerm Skin Lotion  NutraDerm Therapeutic Skin Cream  NutraDerm Therapeutic Skin Lotion  ProShield Protective Hand Cream  Provon moisturizing lotion   Please read over the following fact sheets that you were given.

## 2023-08-03 NOTE — Anesthesia Preprocedure Evaluation (Addendum)
 Anesthesia Evaluation  Patient identified by MRN, date of birth, ID band Patient awake    Reviewed: Allergy  & Precautions, H&P , NPO status , Patient's Chart, lab work & pertinent test results  History of Anesthesia Complications (+) PONV, PROLONGED EMERGENCE and history of anesthetic complications (no issues w/ last surgery)  Airway Mallampati: II  TM Distance: >3 FB Neck ROM: Full    Dental  (+) Teeth Intact, Dental Advisory Given   Pulmonary shortness of breath and with exertion, sleep apnea (mild OSA, no cpap) , COPD,  COPD inhaler, former smoker Quit smoking 1993, 35 pack year history    Pulmonary exam normal breath sounds clear to auscultation       Cardiovascular hypertension (168/79, no home meds), + Peripheral Vascular Disease  Normal cardiovascular exam+ dysrhythmias (eliquis  LD 3d) Atrial Fibrillation + Valvular Problems/Murmurs (mild-mod AI, mild MR, mild AS) AI, MR and AS  Rhythm:Regular Rate:Normal  TEE 02/02/2023 ([facility]): LVEF 65-70%  Realtime 3D imaging was utilized to visualize left atrial appendage. No evidence of thrombus in LAA.  Mild-moderate aortic valve regurgitation  Mild aortic stenosis Mild mitral regurgitation Mild tricuspid regurgitation Trace pulmonary regurgitation      Neuro/Psych CVA, No Residual Symptoms  negative psych ROS   GI/Hepatic Neg liver ROS,GERD  Medicated and Controlled,,  Endo/Other  negative endocrine ROS    Renal/GU Renal InsufficiencyRenal diseaseCr 1.28  negative genitourinary   Musculoskeletal  (+) Arthritis , Osteoarthritis,    Abdominal   Peds negative pediatric ROS (+)  Hematology negative hematology ROS (+) Hb 15, plt 124   Anesthesia Other Findings   Reproductive/Obstetrics negative OB ROS                             Anesthesia Physical Anesthesia Plan  ASA: 3  Anesthesia Plan: General   Post-op Pain Management: Tylenol   PO (pre-op)*   Induction: Intravenous  PONV Risk Score and Plan: 3 and Ondansetron , Dexamethasone  and Treatment may vary due to age or medical condition  Airway Management Planned: Oral ETT  Additional Equipment: None  Intra-op Plan:   Post-operative Plan: Extubation in OR  Informed Consent: I have reviewed the patients History and Physical, chart, labs and discussed the procedure including the risks, benefits and alternatives for the proposed anesthesia with the patient or authorized representative who has indicated his/her understanding and acceptance.     Dental advisory given  Plan Discussed with: CRNA  Anesthesia Plan Comments: ( )       Anesthesia Quick Evaluation

## 2023-08-03 NOTE — Progress Notes (Signed)
 Anesthesia Chart Review:   Case: [id number] Date/Time: 08/11/23 0715   Procedure: DECOMPRESSIVE LUMBAR LAMINECTOMY LEVEL 1 - L4-5 LAMINECTOMY   Anesthesia type: General   Diagnosis: Lumbar stenosis with neurogenic claudication [M48.062]   Pre-op diagnosis: LUMBAR STENOSIS WITH NEUROGENIC CLAUDICATION   [facility] ROOM 18 / [facility]   Surgeons: Diedra Fowler, MD       DISCUSSION: Patient is an 89 year old male scheduled for the above procedure.   History includes former smoker (quit 06/03/91), post-operative N/V, HTN, HLD, afib (s/p DCCV 02/02/23), CVA (2011), COPD, carotid artery disease (s/p left CEA 06/29/11; 1-39% BICA 01/06/23), COPD, GERD, CKD, OSA (mild 01/2023). Reported prolonged emergence with anesthesia. S/p left elbow lipoma removal 01/24/23.   Cardiologist is Dr. Christiane Cowing with [facility], last visit 05/16/23. He is followed for persistent afib, s/p DCCV 02/02/24. TEE at that time showed LVEF 65-70%, mild-moderate AR, mild AS/MR/TR. He also had recent Watchman Evaluation by Chuckie Craven, PA-C on 05/18/23, "The patient is being considered for Watchman LAA occlusion device. The patient has need for anticoagulation, considered high risk for stroke. We discussed that he does not currently have contraindication for long-term anticoagulation. He will continue Eliquis  5mg  BID and monitor for signs/symptoms of bleeding."    CCTA done in April 2025 to evaluated for CAD as cause for fatigue. Nebivolol also held then for bradycardia. CCTA showed CAC 431, moderate mid LAD with FFRct value of 0.92 with low likelihood of lesion-specific ischemia. Dr. Sulema Endo did reach out to Dr. Christiane Cowing who gave permission to hold Eliquis  for 2 days prior to surgery as requested (scanned under Media tab). Last dose planned for 08/08/23.   Anesthesia team to evaluate on the day of surgery.    VS: BP (!) 175/80   Pulse 72   Temp 36.8 C   Resp 18   Ht 6' (1.829 m)   Wt 77 kg   SpO2 99%   BMI 23.03 kg/m    PROVIDERS: Jeannine Milroy., MD is PCP  Alric Asp, MD is cardiologist Legrand Puma, MD is GI Merced Stair, MD was vascular surgeon (retired). Last visit 01/25/23 with Deneen Finical, PA-C with recent carotid US  showing 1-39% BICA stenosis. 18 month follow-up recommended.    LABS: Labs reviewed: Acceptable for surgery. PLT 123K, previously 130K on 01/19/23 and 124K on 02/02/23. Cr 1.28, previously 1.33 on 01/19/23 and 1.10 on 02/02/23.  (all labs ordered are listed, but only abnormal results are displayed)  Labs Reviewed  CBC - Abnormal; Notable for the following components:      Result Value   Platelets 124 (*)    All other components within normal limits  BASIC METABOLIC PANEL WITH GFR - Abnormal; Notable for the following components:   Creatinine, Ser 1.28 (*)    GFR, Estimated 55 (*)    All other components within normal limits  SURGICAL PCR SCREEN     OTHER: Home Sleep Study 02/20/2023 ([facility]): Impressions:  This home sleep test revealed mild obstructive sleep apnea with a total AHI of 5.8/hr (OSA AHI >=5/hr) with mild Oxygen  desaturation, O2 nadir was 86%. Mild snoring was observed.  Recommendations:  PAP therapy is an option for the treatment of mild sleep apnea if patient has other medical or psychological comorbidities...    IMAGES: CT Chest (over read CCTA 05/31/23; [facility]): - Mediastinum: No adenopathy.  - Visualized lung fields: There are a diffuse pulmonary nodules in the visualized lung bases. 3 mm semisolid  nodule in the right middle lobe on image 5. Subpleural nodular opacity measuring 6 mm in the right middle lobe on image 25. 2 mm nodule in the right middle lobe on image 28. Please see follow-up recommendations below.  - Visualized abdomen: No significant abnormality.  - Bones: No acute or aggressive bony abnormality.  - Other: N/A   Please note that a full report detailing cardiac findings of this exam will follow as an addendum.   Excerpt from "Guidelines for  Management of Incidental Pulmonary Nodules Detected on CT Images: From the Fleischner Society 2017"  Radiology 2017.    Note: Guidelines do not apply to lung cancer screening, patients with immunosuppression, or patients with known primary cancer.   Multiple nodules 6-71mm:  Low risk- CT at 3-63months, then consider CT at 18-24 months  High risk- CT at 3-67months, then at 18-24 months    EKG:  EKG 05/16/23 ([facility]; scanned under Media tab): Baseline artifact. Sinus bradycardia at 54 bpm.   EKG 01/19/23: Atrial fibrillation Left axis deviation Septal infarct , age undetermined Abnormal ECG When compared with ECG of 12-Aug-2020 11:14, rhythm change from sinus to afib Confirmed by [place] [phone]) on 01/19/2023 11:52:05 PM   CV: CTA Coronary 05/31/2023 ([facility]): IMPRESSION:  - Calcium score of 431.   -  Normal origin and trajectory of coronary arteries.  -  Moderate nonobstructive disease in the proximal to mid LAD.  -  CAD RADS 3 P 3. This study was [facility] to [facility] for CT FFR analysis.  -  Extracardiac findings will be interpreted and detailed in a separate report.  FFR 06/07/23: FFRct interpretation:  1. The stenosis in the prox to mid LAD has a low likelihood of  lesion-specific ischemia with an FFRct value of 0.92    TEE 02/02/2023 ([facility]): LVEF 65-70%  Realtime 3D imaging was utilized to visualize left atrial appendage. No evidence of thrombus in LAA.  Mild-moderate aortic valve regurgitation  Mild aortic stenosis Mild mitral regurgitation Mild tricuspid regurgitation Trace pulmonary regurgitation   Carotid duplex 01/06/2023: Summary:  Right Carotid: Velocities in the right ICA are consistent with a 1-39% stenosis.  Left Carotid: Velocities in the left ICA are consistent with a 1-39% stenosis.  Vertebrals: Bilateral vertebral arteries demonstrate antegrade flow.  Subclavians: Normal flow hemodynamics were seen in bilateral subclavian arteries.     Past Medical History:  Diagnosis Date   Arthritis    Bilateral Hands   Carotid artery occlusion    Chronic kidney disease due to hypertension 11/12/2014   Complication of anesthesia    difficulty awakening from anesthesia, & N & V   COPD (chronic obstructive pulmonary disease) (HCC)    GERD (gastroesophageal reflux disease)    Hyperlipidemia    Hypertension    Mild obstructive sleep apnea 07/04/2023   PONV (postoperative nausea and vomiting)    Recurrent upper respiratory infection (URI)    last bout of bronchitis 04/2011.   Rupture of kidney    Shortness of breath    at times due to COPD.   Stroke [facility])    no deficits.    Past Surgical History:  Procedure Laterality Date   APPENDECTOMY     ELBOW SURGERY Left    x3   ENDARTERECTOMY  06/29/2011   Procedure: ENDARTERECTOMY CAROTID;  Surgeon: Palma Bob, MD;  Location: [facility] OR;  Service: Vascular;  Laterality: Left;   EYE SURGERY  Aug. and Dec. 2012   Detatched retina and Macular Deg.  EYE SURGERY Bilateral 03/31/2013   Eyelid / Eyebrow   INGUINAL HERNIA REPAIR Right 08/17/2020   Procedure: LAPAROSCOPIC RIGHT INGUINAL HERNIA REPAIR;  Surgeon: Shela Derby, MD;  Location: [facility] OR;  Service: General;  Laterality: Right;   INSERTION OF MESH  08/17/2020   Procedure: INSERTION OF MESH;  Surgeon: Shela Derby, MD;  Location: [facility] OR;  Service: General;;   KIDNEY SURGERY     LIPOMA EXCISION Left 01/24/2023   Procedure: LEFT ELBOW LIPOMA REMOVAL;  Surgeon: Jasmine Mesi, MD;  Location: [facility];  Service: Orthopedics;  Laterality: Left;   SHOULDER SURGERY Left     MEDICATIONS:  acetaminophen  (TYLENOL ) 500 MG tablet   apixaban  (ELIQUIS ) 5 MG TABS tablet   atorvastatin (LIPITOR) 40 MG tablet   calcium carbonate (TUMS - DOSED IN MG ELEMENTAL CALCIUM) 500 MG chewable tablet   clobetasol cream (TEMOVATE) 0.05 %   DUPIXENT 300 MG/2ML SOAJ   famotidine  (PEPCID ) 20 MG tablet   omeprazole  (PRILOSEC) 40 MG capsule    Polyethyl Glyc-Propyl Glyc PF (SYSTANE HYDRATION PF) 0.4-0.3 % SOLN   prednisoLONE acetate (PRED FORTE) 1 % ophthalmic suspension   tamsulosin (FLOMAX) 0.4 MG CAPS capsule   Tiotropium Bromide-Olodaterol (STIOLTO RESPIMAT) 2.5-2.5 MCG/ACT AERS   traMADol  (ULTRAM ) 50 MG tablet   No current facility-administered medications for this encounter.    Ella Gun, PA-C Surgical Short Stay/Anesthesiology [facility] Phone [phone] [facility] Phone [phone] 08/03/2023 6:37 PM

## 2023-08-11 ENCOUNTER — Ambulatory Visit (HOSPITAL_COMMUNITY)

## 2023-08-11 ENCOUNTER — Ambulatory Visit (HOSPITAL_COMMUNITY): Payer: Self-pay

## 2023-08-11 ENCOUNTER — Other Ambulatory Visit: Payer: Self-pay

## 2023-08-11 ENCOUNTER — Observation Stay (HOSPITAL_COMMUNITY)
Admission: RE | Admit: 2023-08-11 | Discharge: 2023-08-12 | Disposition: A | Discharge status: Home / Self Care | Attending: Orthopedic Surgery | Admitting: Orthopedic Surgery

## 2023-08-11 ENCOUNTER — Ambulatory Visit (HOSPITAL_COMMUNITY): Payer: Self-pay | Admitting: Vascular Surgery

## 2023-08-11 ENCOUNTER — Encounter (HOSPITAL_COMMUNITY)
Admission: RE | Disposition: A | Discharge status: Home / Self Care | Payer: Self-pay | Source: Home / Self Care | Attending: Orthopedic Surgery

## 2023-08-11 ENCOUNTER — Encounter (HOSPITAL_COMMUNITY): Payer: Self-pay | Admitting: Orthopedic Surgery

## 2023-08-11 DIAGNOSIS — I480 Paroxysmal atrial fibrillation: Secondary | ICD-10-CM

## 2023-08-11 DIAGNOSIS — N189 Chronic kidney disease, unspecified: Secondary | ICD-10-CM | POA: Diagnosis not present

## 2023-08-11 DIAGNOSIS — I129 Hypertensive chronic kidney disease with stage 1 through stage 4 chronic kidney disease, or unspecified chronic kidney disease: Secondary | ICD-10-CM | POA: Insufficient documentation

## 2023-08-11 DIAGNOSIS — I10 Essential (primary) hypertension: Secondary | ICD-10-CM | POA: Diagnosis not present

## 2023-08-11 DIAGNOSIS — Z0189 Encounter for other specified special examinations: Secondary | ICD-10-CM | POA: Diagnosis not present

## 2023-08-11 DIAGNOSIS — Z8673 Personal history of transient ischemic attack (TIA), and cerebral infarction without residual deficits: Secondary | ICD-10-CM | POA: Insufficient documentation

## 2023-08-11 DIAGNOSIS — M48062 Spinal stenosis, lumbar region with neurogenic claudication: Secondary | ICD-10-CM

## 2023-08-11 DIAGNOSIS — N1831 Chronic kidney disease, stage 3a: Secondary | ICD-10-CM | POA: Diagnosis not present

## 2023-08-11 DIAGNOSIS — J449 Chronic obstructive pulmonary disease, unspecified: Secondary | ICD-10-CM

## 2023-08-11 DIAGNOSIS — Z91040 Latex allergy status: Secondary | ICD-10-CM | POA: Insufficient documentation

## 2023-08-11 DIAGNOSIS — Z01818 Encounter for other preprocedural examination: Principal | ICD-10-CM

## 2023-08-11 HISTORY — PX: DECOMPRESSIVE LUMBAR LAMINECTOMY LEVEL 1: SHX5791

## 2023-08-11 SURGERY — DECOMPRESSIVE LUMBAR LAMINECTOMY LEVEL 1
Anesthesia: General

## 2023-08-11 MED ORDER — ONDANSETRON HCL 4 MG PO TABS: 4.0000 mg | ORAL_TABLET | Freq: Four times a day (QID) | ORAL | Status: DC | PRN

## 2023-08-11 MED ORDER — DEXAMETHASONE SODIUM PHOSPHATE 10 MG/ML IJ SOLN
10.0000 mg | Freq: Once | INTRAMUSCULAR | Status: AC
  Filled 2023-08-11: qty 1

## 2023-08-11 MED ORDER — AMISULPRIDE (ANTIEMETIC) 5 MG/2ML IV SOLN: 10.0000 mg | Freq: Once | INTRAVENOUS | Status: DC | PRN

## 2023-08-11 MED ORDER — LIDOCAINE 2% (20 MG/ML) 5 ML SYRINGE
INTRAMUSCULAR | Status: AC
  Filled 2023-08-11: qty 5

## 2023-08-11 MED ORDER — ALBUTEROL SULFATE HFA 108 (90 BASE) MCG/ACT IN AERS: INHALATION_SPRAY | RESPIRATORY_TRACT | Status: DC | PRN

## 2023-08-11 MED ORDER — ONDANSETRON HCL 4 MG/2ML IJ SOLN
4.0000 mg | Freq: Four times a day (QID) | INTRAMUSCULAR | Status: DC | PRN
  Administered 2023-08-11: 4 mg via INTRAVENOUS
  Filled 2023-08-11: qty 2

## 2023-08-11 MED ORDER — ENSURE SURGERY PO LIQD
237.0000 mL | Freq: Two times a day (BID) | ORAL | Status: DC
  Administered 2023-08-11 – 2023-08-12 (×2): 237 mL via ORAL
  Filled 2023-08-11 (×3): qty 237

## 2023-08-11 MED ORDER — ONDANSETRON HCL 4 MG/2ML IJ SOLN
INTRAMUSCULAR | Status: AC
  Filled 2023-08-11: qty 2

## 2023-08-11 MED ORDER — TRANEXAMIC ACID-NACL 1000-0.7 MG/100ML-% IV SOLN
1000.0000 mg | INTRAVENOUS | Status: AC
  Filled 2023-08-11: qty 100

## 2023-08-11 MED ORDER — PROPOFOL 10 MG/ML IV BOLUS
INTRAVENOUS | Status: AC
  Filled 2023-08-11: qty 20

## 2023-08-11 MED ORDER — FAMOTIDINE 20 MG PO TABS
20.0000 mg | ORAL_TABLET | Freq: Two times a day (BID) | ORAL | Status: DC
  Administered 2023-08-11 – 2023-08-12 (×2): 20 mg via ORAL
  Filled 2023-08-11 (×2): qty 1

## 2023-08-11 MED ORDER — CEFAZOLIN SODIUM-DEXTROSE 2-4 GM/100ML-% IV SOLN
2.0000 g | INTRAVENOUS | Status: AC
  Filled 2023-08-11: qty 100

## 2023-08-11 MED ORDER — ACETAMINOPHEN 500 MG PO TABS
1000.0000 mg | ORAL_TABLET | Freq: Once | ORAL | Status: AC
  Administered 2023-08-11: 1000 mg via ORAL
  Filled 2023-08-11: qty 2

## 2023-08-11 MED ORDER — ROCURONIUM BROMIDE 10 MG/ML (PF) SYRINGE
PREFILLED_SYRINGE | INTRAVENOUS | Status: AC
  Filled 2023-08-11: qty 10

## 2023-08-11 MED ORDER — PHENYLEPHRINE 80 MCG/ML (10ML) SYRINGE FOR IV PUSH (FOR BLOOD PRESSURE SUPPORT): PREFILLED_SYRINGE | INTRAVENOUS | Status: DC | PRN

## 2023-08-11 MED ORDER — TAMSULOSIN HCL 0.4 MG PO CAPS
0.4000 mg | ORAL_CAPSULE | Freq: Two times a day (BID) | ORAL | Status: DC
  Administered 2023-08-11 – 2023-08-12 (×2): 0.4 mg via ORAL
  Filled 2023-08-11 (×2): qty 1

## 2023-08-11 MED ORDER — ONDANSETRON HCL 4 MG/2ML IJ SOLN: INTRAMUSCULAR | Status: DC | PRN

## 2023-08-11 MED ORDER — LACTATED RINGERS IV SOLN: INTRAVENOUS | Status: DC

## 2023-08-11 MED ORDER — VANCOMYCIN HCL 1000 MG IV SOLR
INTRAVENOUS | Status: AC
  Filled 2023-08-11: qty 20

## 2023-08-11 MED ORDER — OXYCODONE HCL 5 MG PO TABS
5.0000 mg | ORAL_TABLET | ORAL | Status: DC | PRN
  Administered 2023-08-11 (×2): 7.5 mg via ORAL
  Administered 2023-08-12: 5 mg via ORAL
  Administered 2023-08-12: 7.5 mg via ORAL
  Filled 2023-08-11 (×3): qty 2
  Filled 2023-08-11: qty 1

## 2023-08-11 MED ORDER — ALBUMIN HUMAN 5 % IV SOLN: INTRAVENOUS | Status: DC | PRN

## 2023-08-11 MED ORDER — POVIDONE-IODINE 10 % EX SWAB
2.0000 | Freq: Once | CUTANEOUS | Status: AC
  Administered 2023-08-11: 2 via TOPICAL

## 2023-08-11 MED ORDER — HYDROMORPHONE HCL 1 MG/ML IJ SOLN
INTRAMUSCULAR | Status: AC
  Filled 2023-08-11: qty 1

## 2023-08-11 MED ORDER — FENTANYL CITRATE (PF) 250 MCG/5ML IJ SOLN
INTRAMUSCULAR | Status: AC
  Filled 2023-08-11: qty 5

## 2023-08-11 MED ORDER — CHLORHEXIDINE GLUCONATE 0.12 % MT SOLN
15.0000 mL | Freq: Once | OROMUCOSAL | Status: AC
  Administered 2023-08-11: 15 mL via OROMUCOSAL
  Filled 2023-08-11: qty 15

## 2023-08-11 MED ORDER — UMECLIDINIUM BROMIDE 62.5 MCG/ACT IN AEPB
1.0000 | INHALATION_SPRAY | Freq: Every day | RESPIRATORY_TRACT | Status: DC
  Administered 2023-08-12: 1 via RESPIRATORY_TRACT
  Filled 2023-08-11: qty 7

## 2023-08-11 MED ORDER — ORAL CARE MOUTH RINSE: 15.0000 mL | Freq: Once | OROMUCOSAL | Status: AC

## 2023-08-11 MED ORDER — PREDNISOLONE ACETATE 1 % OP SUSP
1.0000 [drp] | Freq: Three times a day (TID) | OPHTHALMIC | Status: DC
  Filled 2023-08-11: qty 5

## 2023-08-11 MED ORDER — POLYETHYLENE GLYCOL 3350 17 G PO PACK
17.0000 g | PACK | Freq: Every day | ORAL | Status: DC
  Administered 2023-08-12: 17 g via ORAL

## 2023-08-11 MED ORDER — ONDANSETRON HCL 4 MG/2ML IJ SOLN: 4.0000 mg | Freq: Once | INTRAMUSCULAR | Status: DC | PRN

## 2023-08-11 MED ORDER — ARFORMOTEROL TARTRATE 15 MCG/2ML IN NEBU
15.0000 ug | INHALATION_SOLUTION | Freq: Two times a day (BID) | RESPIRATORY_TRACT | Status: DC
  Administered 2023-08-11 – 2023-08-12 (×3): 15 ug via RESPIRATORY_TRACT
  Filled 2023-08-11 (×3): qty 2

## 2023-08-11 MED ORDER — CEFAZOLIN SODIUM-DEXTROSE 2-4 GM/100ML-% IV SOLN
2.0000 g | Freq: Four times a day (QID) | INTRAVENOUS | Status: AC
  Administered 2023-08-11 (×2): 2 g via INTRAVENOUS
  Filled 2023-08-11 (×2): qty 100

## 2023-08-11 MED ORDER — POLYETHYL GLYC-PROPYL GLYC PF 0.4-0.3 % OP SOLN: 1.0000 [drp] | Freq: Every day | OPHTHALMIC | Status: DC

## 2023-08-11 MED ORDER — PHENYLEPHRINE HCL-NACL 20-0.9 MG/250ML-% IV SOLN: INTRAVENOUS | Status: DC | PRN

## 2023-08-11 MED ORDER — HYDROMORPHONE HCL 1 MG/ML IJ SOLN: 0.5000 mg | INTRAMUSCULAR | Status: AC | PRN

## 2023-08-11 MED ORDER — FENTANYL CITRATE (PF) 100 MCG/2ML IJ SOLN
25.0000 ug | INTRAMUSCULAR | Status: DC | PRN
  Administered 2023-08-11: 50 ug via INTRAVENOUS

## 2023-08-11 MED ORDER — FENTANYL CITRATE (PF) 100 MCG/2ML IJ SOLN
INTRAMUSCULAR | Status: AC
Start: 2023-08-11 — End: 2023-08-11
  Filled 2023-08-11: qty 2

## 2023-08-11 MED ORDER — PROPOFOL 10 MG/ML IV BOLUS: INTRAVENOUS | Status: DC | PRN

## 2023-08-11 MED ORDER — HYDROMORPHONE HCL 1 MG/ML IJ SOLN: 0.2500 mg | INTRAMUSCULAR | Status: DC | PRN

## 2023-08-11 MED ORDER — PANTOPRAZOLE SODIUM 40 MG PO TBEC
40.0000 mg | DELAYED_RELEASE_TABLET | Freq: Every day | ORAL | Status: DC
  Administered 2023-08-12: 40 mg via ORAL
  Filled 2023-08-11: qty 1

## 2023-08-11 MED ORDER — GLYCOPYRROLATE 0.2 MG/ML IJ SOLN: INTRAMUSCULAR | Status: DC | PRN

## 2023-08-11 MED ORDER — LIDOCAINE 2% (20 MG/ML) 5 ML SYRINGE: INTRAMUSCULAR | Status: DC | PRN

## 2023-08-11 MED ORDER — TRANEXAMIC ACID-NACL 1000-0.7 MG/100ML-% IV SOLN
1000.0000 mg | Freq: Once | INTRAVENOUS | Status: AC
  Administered 2023-08-11: 1000 mg via INTRAVENOUS
  Filled 2023-08-11: qty 100

## 2023-08-11 MED ORDER — SENNA 8.6 MG PO TABS
1.0000 | ORAL_TABLET | Freq: Two times a day (BID) | ORAL | Status: DC
Start: 2023-08-11 — End: 2023-08-12
  Administered 2023-08-11 – 2023-08-12 (×2): 8.6 mg via ORAL
  Filled 2023-08-11 (×2): qty 1

## 2023-08-11 MED ORDER — DEXAMETHASONE SODIUM PHOSPHATE 10 MG/ML IJ SOLN
INTRAMUSCULAR | Status: AC
  Filled 2023-08-11: qty 1

## 2023-08-11 MED ORDER — FENTANYL CITRATE (PF) 250 MCG/5ML IJ SOLN: INTRAMUSCULAR | Status: DC | PRN

## 2023-08-11 MED ORDER — HYDROMORPHONE HCL 1 MG/ML IJ SOLN
0.2500 mg | INTRAMUSCULAR | Status: DC | PRN
  Administered 2023-08-11: 0.5 mg via INTRAVENOUS
  Administered 2023-08-11: 0.25 mg via INTRAVENOUS
  Administered 2023-08-11 (×2): 0.5 mg via INTRAVENOUS
  Administered 2023-08-11: 0.25 mg via INTRAVENOUS

## 2023-08-11 MED ORDER — OXYCODONE HCL 5 MG/5ML PO SOLN: 5.0000 mg | Freq: Once | ORAL | Status: DC | PRN

## 2023-08-11 MED ORDER — OXYCODONE HCL 5 MG PO TABS: 5.0000 mg | ORAL_TABLET | Freq: Once | ORAL | Status: DC | PRN

## 2023-08-11 MED ORDER — ROCURONIUM BROMIDE 10 MG/ML (PF) SYRINGE: PREFILLED_SYRINGE | INTRAVENOUS | Status: DC | PRN

## 2023-08-11 MED ORDER — ATORVASTATIN CALCIUM 40 MG PO TABS
40.0000 mg | ORAL_TABLET | Freq: Every day | ORAL | Status: DC
  Administered 2023-08-11 – 2023-08-12 (×2): 40 mg via ORAL
  Filled 2023-08-11 (×3): qty 1

## 2023-08-11 MED ORDER — ACETAMINOPHEN 500 MG PO TABS
1000.0000 mg | ORAL_TABLET | Freq: Three times a day (TID) | ORAL | Status: DC
  Administered 2023-08-11 – 2023-08-12 (×3): 1000 mg via ORAL
  Filled 2023-08-11 (×3): qty 2

## 2023-08-11 MED ORDER — SUGAMMADEX SODIUM 200 MG/2ML IV SOLN: INTRAVENOUS | Status: DC | PRN

## 2023-08-11 SURGICAL SUPPLY — 38 items
BENZOIN TINCTURE AMPULE (MISCELLANEOUS) ×2 IMPLANT
BENZOIN TINCTURE PRP APPL 2/3 (GAUZE/BANDAGES/DRESSINGS) ×2 IMPLANT
BUR MATCHSTICK NEURO 3.0 LAGG (BURR) ×2 IMPLANT
CANISTER SUCTION 3000ML PPV (SUCTIONS) ×2 IMPLANT
COVER MAYO STAND STRL (DRAPES) ×6 IMPLANT
COVER SURGICAL LIGHT HANDLE (MISCELLANEOUS) ×2 IMPLANT
DRAIN HEMOVAC 7FR (DRAIN) IMPLANT
DRAPE C-ARM 42X72 X-RAY (DRAPES) ×2 IMPLANT
DRAPE UTILITY XL STRL (DRAPES) ×4 IMPLANT
DRESSING MEPILEX FLEX 4X4 (GAUZE/BANDAGES/DRESSINGS) ×2 IMPLANT
DRSG MEPILEX POST OP 4X8 (GAUZE/BANDAGES/DRESSINGS) ×2 IMPLANT
DRSG TEGADERM 4X10 (GAUZE/BANDAGES/DRESSINGS) IMPLANT
DRSG TEGADERM 4X4.75 (GAUZE/BANDAGES/DRESSINGS) ×6 IMPLANT
DURAPREP 26ML APPLICATOR (WOUND CARE) ×2 IMPLANT
ELECT PENCIL ROCKER SW 15FT (MISCELLANEOUS) ×2 IMPLANT
ELECTRODE REM PT RTRN 9FT ADLT (ELECTROSURGICAL) ×2 IMPLANT
GAUZE SPONGE 4X4 12PLY STRL (GAUZE/BANDAGES/DRESSINGS) ×2 IMPLANT
GLOVE BIO SURGEON STRL SZ7.5 (GLOVE) ×2 IMPLANT
GLOVE INDICATOR 7.5 STRL GRN (GLOVE) ×2 IMPLANT
GOWN STRL SURGICAL XL XLNG (GOWN DISPOSABLE) ×2 IMPLANT
KIT BASIN OR (CUSTOM PROCEDURE TRAY) ×2 IMPLANT
KIT POSITIONER JACKSON TABLE (MISCELLANEOUS) ×2 IMPLANT
KIT TURNOVER KIT B (KITS) ×2 IMPLANT
NS IRRIG 1000ML POUR BTL (IV SOLUTION) ×2 IMPLANT
PACK LAMINECTOMY ORTHO (CUSTOM PROCEDURE TRAY) ×2 IMPLANT
PATTIES SURGICAL .5 X.5 (GAUZE/BANDAGES/DRESSINGS) IMPLANT
SPONGE SURGIFOAM ABS GEL 100 (HEMOSTASIS) ×2 IMPLANT
SUCTION TUBE FRAZIER 10FR DISP (SUCTIONS) ×2 IMPLANT
SUT BONE WAX W31G (SUTURE) ×2 IMPLANT
SUT MNCRL AB 3-0 PS2 27 (SUTURE) ×2 IMPLANT
SUT MNCRL+ AB 3-0 CT1 36 (SUTURE) ×2 IMPLANT
SUT VIC AB 0 CT1 18XCR BRD8 (SUTURE) ×2 IMPLANT
SUT VIC AB 2-0 CT1 18 (SUTURE) ×2 IMPLANT
SUT VIC AB 2-0 CT2 18 VCP726D (SUTURE) ×2 IMPLANT
TOWEL GREEN STERILE (TOWEL DISPOSABLE) ×2 IMPLANT
TOWEL GREEN STERILE FF (TOWEL DISPOSABLE) ×2 IMPLANT
TUBING FEATHERFLOW (TUBING) ×2 IMPLANT
WATER STERILE IRR 1000ML POUR (IV SOLUTION) ×2 IMPLANT

## 2023-08-11 NOTE — Op Note (Signed)
 Orthopedic Spine Surgery Operative Report  Procedure: L4, L5 lumbar laminectomies with partial medial facetectomies  Modifier: none  Date of procedure: 08/11/2023  Patient name: Robert Bowers MRN: [id number] DOB: Jul 08, 1934  Surgeon: Colette Davies, MD Assistant: none Pre-operative diagnosis: lumbar stenosis with neurogenic claudication Post-operative diagnosis: same as above Findings: L4/5 hypertrophic facets and thickened ligamentum flavum  Specimens: none Anesthesia: general EBL: 150cc Complications: none Pre-incision antibiotic: ancef  TXA given prior to incision as well  Implants: none   Indication for procedure: Patient is a 89 y.o. male who presented to the office with symptoms consistent with neurogenic claudication. The patient had tried conservative treatments that did not provide any lasting relief. As result, operative management was discussed. The pre-operative MRI showed stenosis at L4 and L5 so L4 and L5 laminectomies and partial medial facetectomies was presented as a treatment option. The risks including but not limited to iatrogenic instability, dural tear, nerve root injury, paralysis, persistent pain, infection, bleeding, heart attack, death, stroke, fracture, dvt/pe, and need for additional procedures were discussed with the patient. The benefit of the surgery would be relief of the patient's radiating leg pain. The alternatives to surgical management were covered with the patient and included continued monitoring, physical therapy, over-the-counter pain medications, ambulatory aids, and activity modification. All the patient's questions were answered to his satisfaction. After this discussion, the patient expressed understanding and elected to proceed with surgical intervention.   Procedure Description: The patient was met in the pre-operative holding area. The patient's identity and consent were verified. The operative site was marked. The patient's remaining  questions about the surgery were answered. The patient was brought back to the operating room. General anesthesia was induced and an endotracheal tube was placed by the anesthesia staff. The patient was transferred to the prone [place] table in the prone position. All bony prominences were well padded. The head of the bed was slightly elevated and the eyes were free from compression by the face pillow. The surgical area was cleansed with alcohol . Fluoroscopy was then brought in to check rotation on the AP image and to mark the levels on the lateral image. The patient's skin was then prepped and draped in a standard, sterile fashion. A time out was performed that identified the patient, the procedure, and the operative levels. All team members agreed with what was stated in the time out.   A midline incision over the spinous processes of the previously marked levels was made and sharp dissection was continued down through the skin and dermis. Electrocautery was then used to continue the midline dissection down to the level of the spinous process. Subperiosteal dissection was performed using electrocautery to expose the lamina out lateral to the facet joint capsule. Care was taken to not violate the facet joint capsules. A lateral fluoroscopic image was taken to confirm the level. Subperiosteal dissection with electrocautery was then done to expose the lamina and pars interarticularis of L4 and L5. Again, care was taken to avoid disruption of the facet capsules.    A rongeur was used to remove the spinous processes and interspinous ligaments between the L3/4 interspinous ligament to the cranial portion of the L5 spinous process. Bone wax was used to obtain hemostasis at the bleeding bony surfaces. A high-speed burr was used to thin the lamina at L4 and the cranial aspect of L5 to the level of the ligamentum flavum. Above the level of the ligamentum, the lamina was thinned with the burr to the approximate level of  the ligamentum. Care was taken to leave at least 8mm of pars interarticularis on each side. A series of Kerrison rongeurs were used to remove the remaining lamina and ligamentum overlying the thecal sac. A woodsen was then used to protect the thecal sac as 3 and 4 kerrisons were used to remove the medial portion of the L4/5 facet joint on the left. The same process was then repeated to remove the medial portion of the L4/5 facet on the right side.    A woodsen was placed into the laminectomy site to palpate for any remaining areas of stenosis. Once it was confirmed with the woodsen that decompression had been completed from the medial pedicle wall to the contralateral medial pedicle wall and from the pedicle of L4 to the pedicle of L5, decompression was determined to be completed.   The wound was copiously irrigated with sterile saline. 1g of vancomycin powder was placed into the wound. The fascia was reapproximated with 0 vicryl suture. The subcutaneous fat was reapproximated with 0 vicryl suture. The deep dermal layer was reapproximated with 2-0 vicryl. The skin as closed with a 3-0 running monocryl. All counts were correct at the end of the case. Dermabond was applied over the skin. An island dressing was placed over the wound. The patient was transferred back to a bed and brought to the post-anesthesia care unit by anesthesia staff in stable condition.  Post-operative plan: The patient will recover in the post-anesthesia care unit and then go to the floor. The patient will receive two post-operative doses of ancef . The patient will be out of bed as tolerated with no brace. The patient will work with physical therapy. The patient will likely discharge to home tomorrow.    Colette Davies, MD Orthopedic Surgeon

## 2023-08-11 NOTE — Anesthesia Postprocedure Evaluation (Signed)
 Anesthesia Post Note  Patient: Robert Bowers  Procedure(s) Performed: DECOMPRESSIVE LUMBAR LAMINECTOMY LEVEL 1     Patient location during evaluation: PACU Anesthesia Type: General Level of consciousness: awake and alert, oriented and patient cooperative Pain management: pain level controlled Vital Signs Assessment: post-procedure vital signs reviewed and stable Respiratory status: spontaneous breathing, nonlabored ventilation and respiratory function stable Cardiovascular status: blood pressure returned to baseline and stable Postop Assessment: no apparent nausea or vomiting Anesthetic complications: no   No notable events documented.  Last Vitals:  Vitals:   08/11/23 1100 08/11/23 1115  BP: 107/60 132/72  Pulse: 66 72  Resp: 14 14  Temp:    SpO2: 93% 93%    Last Pain:  Vitals:   08/11/23 1053  TempSrc:   PainSc: Asleep    LLE Motor Response: Responds to commands (08/11/23 1115) LLE Sensation: Full sensation (08/11/23 1115) RLE Motor Response: Responds to commands (08/11/23 1115) RLE Sensation: Full sensation (08/11/23 1115)      Jacquelyne Matte

## 2023-08-11 NOTE — Plan of Care (Signed)

## 2023-08-11 NOTE — H&P (Signed)
 Orthopedic Spine Surgery H&P Note  Assessment: Patient is a 89 y.o. male with lumbar stenosis and neurogenic claudication   Plan: -Out of bed as tolerated, activity as tolerated, no brace -Covered the risks of surgery one more time with the patient and patient elected to proceed with planned surgery -Written consent verified -Hold anticoagulation in anticipation of surgery -Ancef  and TXA on all to OR -NPO for procedure -Site marked -To OR when ready  The risks covered this morning included but were not limited to: iatrogenic instability, dural tear, nerve root injury, paralysis, persistent pain, infection, bleeding, heart attack, death, fracture, dvt/pe, and need for additional procedures.  ___________________________________________________________________________  Chief Complaint: low back and bilateral lower extremity pain   History: Patient is 89 y.o. male who has been previously seen in the office for low back that goes into his posterolateral left leg and right buttock. This pain improved when seating or leaning forward consistent with neurogenic claudication. His symptoms failed to improve with conservative treatment so operative management was discussed at the last office visit. The patient presents today with no changes in his symptoms since the last office visit. See previous office note for further details.    Review of systems: General: denies fevers and chills, myalgias Neurologic: denies recent changes in vision, slurred speech Abdomen: denies nausea, vomiting, hematemesis Respiratory: denies cough, shortness of breath  Past medical history: CKD HTN HLD GERD OSA TIA COPD Chronic low back pain   Allergies: losartan , vicodin, ACEi, latex, adhesive tape   Past surgical history:  Appendectomy Hernia repair Endarterectomy Retina repair Left shoulder surgery   Social history: Denies use of nicotine product (smoking, vaping, patches, smokeless) Alcohol  use:  Yes, approximately 1 drink per day Denies recreational drug use  Family history: -reviewed and not pertinent to neurogenic claudication   Physical Exam:  BMI of 22.5  General: no acute distress, appears stated age Neurologic: alert, answering questions appropriately, following commands Cardiovascular: regular rate, no cyanosis Respiratory: unlabored breathing on room air, symmetric chest rise Psychiatric: appropriate affect, normal cadence to speech   MSK (spine):  -Strength exam      Left  Right  EHL    5/5  5/5 TA    5/5  5/5 GSC    5/5  5/5 Knee extension  5/5  5/5 Knee flexion   5/5  5/5 Hip flexion   5/5  5/5  -Sensory exam    Sensation intact to light touch in L2-S1 nerve distributions of bilateral lower extremities  -Palpable DP pulses bilaterally  Patient name: Robert Bowers Patient MRN: [id number] Date: 08/11/23

## 2023-08-11 NOTE — Anesthesia Procedure Notes (Signed)
 Procedure Name: Intubation Date/Time: 08/11/2023 7:44 AM  Performed by: Gloyd Happ, CRNAPre-anesthesia Checklist: Patient identified, Emergency Drugs available, Suction available and Patient being monitored Patient Re-evaluated:Patient Re-evaluated prior to induction Oxygen Delivery Method: Circle System Utilized Preoxygenation: Pre-oxygenation with 100% oxygen Induction Type: IV induction Ventilation: Mask ventilation without difficulty Laryngoscope Size: Mac and 4 Grade View: Grade I Tube type: Oral Tube size: 7.5 mm Number of attempts: 1 Airway Equipment and Method: Stylet and Oral airway Placement Confirmation: ETT inserted through vocal cords under direct vision, positive ETCO2 and breath sounds checked- equal and bilateral Secured at: 22 cm Tube secured with: Tape Dental Injury: Teeth and Oropharynx as per pre-operative assessment  Comments: Easy, atraumatic intubation. Head and neck midline. Teeth, tongue, and lips unchanged. Soft bite block in between molars and tongue free from pressure.

## 2023-08-11 NOTE — Transfer of Care (Signed)
 Immediate Anesthesia Transfer of Care Note  Patient: Robert Bowers  Procedure(s) Performed: DECOMPRESSIVE LUMBAR LAMINECTOMY LEVEL 1  Patient Location: PACU  Anesthesia Type:General  Level of Consciousness: drowsy  Airway & Oxygen Therapy: Patient Spontanous Breathing and Patient connected to face mask oxygen  Post-op Assessment: Report given to RN and Post -op Vital signs reviewed and stable  Post vital signs: Reviewed and stable  Last Vitals:  Vitals Value Taken Time  BP 100/49 08/11/23 10:55  Temp 36.4 C 08/11/23 10:53  Pulse 66 08/11/23 11:00  Resp 14 08/11/23 11:00  SpO2 93 % 08/11/23 11:00  Vitals shown include unfiled device data.  Last Pain:  Vitals:   08/11/23 1053  TempSrc:   PainSc: Asleep      Patients Stated Pain Goal: 1 (08/11/23 0617)  Complications: No notable events documented.

## 2023-08-11 NOTE — Progress Notes (Signed)
 Orthopedic Surgery Post-operative Progress Note  Assessment: Patient is a 89 y.o. male who is currently admitted after undergoing L4/5 laminectomy   Plan: -Operative plans complete -Drains: none -Out of bed as tolerated, no brace -No bending/lifting/twisting greater than 10 pounds -OT evaluate and treat -Pain control -Regular diet -No chemoprophylaxis for dvt or antiplatelets for 72 hours after surgery -Ancef  x2 post-operative doses -Disposition: to floor from PACU  ___________________________________________________________________________   Subjective: No acute events since surgery. Recovering in PACU. Pain well controlled. No pain radiating into either lower extremity.   Objective:  General: no acute distress, appropriate affect Neurologic: alert, answering questions appropriately, following commands Respiratory: unlabored breathing on room air Skin: dressing clear/dry/intact  MSK (spine):  -Strength exam      Right  Left  EHL    5/5  5/5 TA    5/5  5/5 GSC    5/5  5/5 Knee extension  5/5  5/5 Hip flexion   5/5  5/5  -Sensory exam    Sensation intact to light touch in L2-S1 nerve distributions of bilateral lower extremities   Patient name: Robert Bowers Patient MRN: [id number] Date: 08/11/23

## 2023-08-12 ENCOUNTER — Encounter (HOSPITAL_COMMUNITY): Payer: Self-pay | Admitting: Orthopedic Surgery

## 2023-08-12 ENCOUNTER — Other Ambulatory Visit (HOSPITAL_COMMUNITY): Payer: Self-pay

## 2023-08-12 DIAGNOSIS — M48062 Spinal stenosis, lumbar region with neurogenic claudication: Secondary | ICD-10-CM | POA: Diagnosis not present

## 2023-08-12 DIAGNOSIS — I129 Hypertensive chronic kidney disease with stage 1 through stage 4 chronic kidney disease, or unspecified chronic kidney disease: Secondary | ICD-10-CM | POA: Diagnosis not present

## 2023-08-12 DIAGNOSIS — J449 Chronic obstructive pulmonary disease, unspecified: Secondary | ICD-10-CM | POA: Diagnosis not present

## 2023-08-12 DIAGNOSIS — Z91040 Latex allergy status: Secondary | ICD-10-CM | POA: Diagnosis not present

## 2023-08-12 DIAGNOSIS — N189 Chronic kidney disease, unspecified: Secondary | ICD-10-CM | POA: Diagnosis not present

## 2023-08-12 DIAGNOSIS — Z8673 Personal history of transient ischemic attack (TIA), and cerebral infarction without residual deficits: Secondary | ICD-10-CM | POA: Diagnosis not present

## 2023-08-12 LAB — BASIC METABOLIC PANEL WITH GFR
Anion gap: 12 (ref 5–15)
BUN: 17 mg/dL (ref 8–23)
CO2: 25 mmol/L (ref 22–32)
Calcium: 9.3 mg/dL (ref 8.9–10.3)
Chloride: 101 mmol/L (ref 98–111)
Creatinine, Ser: 1.35 mg/dL — ABNORMAL HIGH (ref 0.61–1.24)
GFR, Estimated: 51 mL/min — ABNORMAL LOW (ref >60)
Glucose, Bld: 131 mg/dL — ABNORMAL HIGH (ref 70–99)
Potassium: 3.9 mmol/L (ref 3.5–5.1)
Sodium: 138 mmol/L (ref 135–145)

## 2023-08-12 LAB — CBC
HCT: 38.6 % — ABNORMAL LOW (ref 39.0–52.0)
Hemoglobin: 12.7 g/dL — ABNORMAL LOW (ref 13.0–17.0)
MCH: 29.5 pg (ref 26.0–34.0)
MCHC: 32.9 g/dL (ref 30.0–36.0)
MCV: 89.6 fL (ref 80.0–100.0)
Platelets: 128 10*3/uL — ABNORMAL LOW (ref 150–400)
RBC: 4.31 MIL/uL (ref 4.22–5.81)
RDW: 13.3 % (ref 11.5–15.5)
WBC: 10.1 10*3/uL (ref 4.0–10.5)
nRBC: 0 % (ref 0.0–0.2)

## 2023-08-12 MED ORDER — POLYETHYLENE GLYCOL 3350 17 GM/SCOOP PO POWD
17.0000 g | Freq: Every day | ORAL | 0 refills | Status: AC
  Filled 2023-08-12: qty 238, 14d supply, fill #0

## 2023-08-12 MED ORDER — SENNA 8.6 MG PO TABS
1.0000 | ORAL_TABLET | Freq: Two times a day (BID) | ORAL | 0 refills | Status: AC
Start: 2023-08-12 — End: 2023-08-26
  Filled 2023-08-12: qty 28, 14d supply, fill #0

## 2023-08-12 MED ORDER — ACETAMINOPHEN 500 MG PO TABS
1000.0000 mg | ORAL_TABLET | Freq: Three times a day (TID) | ORAL | 0 refills | Status: AC
  Filled 2023-08-12: qty 84, 14d supply, fill #0

## 2023-08-12 MED ORDER — METHOCARBAMOL 500 MG PO TABS
500.0000 mg | ORAL_TABLET | Freq: Three times a day (TID) | ORAL | 0 refills | Status: AC | PRN
  Filled 2023-08-12: qty 30, 10d supply, fill #0

## 2023-08-12 MED ORDER — OXYCODONE HCL 5 MG PO TABS
5.0000 mg | ORAL_TABLET | ORAL | 0 refills | Status: AC | PRN
  Filled 2023-08-12: qty 30, 5d supply, fill #0

## 2023-08-12 NOTE — Discharge Instructions (Signed)
 Orthopedic Surgery Discharge Instructions  Patient name: Robert Bowers Procedure Performed: L4/5 laminectomy Date of Surgery: 08/11/2023 Surgeon: Colette Davies, MD  Pre-operative Diagnosis: lumbar stenosis with neurogenic claudication Post-operative Diagnosis: same as above  Discharge Date: 08/12/2023 Discharged to: home Discharge Condition: stable  Activity: You should refrain from bending, lifting, or twisting with objects greater than ten pounds until six weeks after surgery. You are encouraged to walk as much as desired. You can perform household activities such as cleaning dishes, doing laundry, vacuuming, etc. as long as the ten-pound restriction is followed. You do not need to wear a brace during the post-operative period.   Incision Care: Your incision site has a dressing over it. That dressing should remain in place and dry at all times for a total of one week after surgery. After one week, you can remove the dressing. Underneath the dressing, you will find skin glue. You should leave this skin glue in place. It will fall off with time. Do not pick, rub, or scrub at it. Do not put cream or lotion over the surgical area. After one week and once the dressing is off, it is okay to let soap and water run over your incision. Again, do not pick, scrub, or rub at the skin glue when bathing. Do not submerge (e.g., take a bath, swim, go in a hot tub, etc.) until six weeks after surgery. There may be some bloody drainage from the incision into the dressing after surgery. This is normal. You do not need to replace the dressing. Continue to leave it in place for the one week as instructed above. Should the dressing become saturated with blood or drainage, please call the office for further instructions.   Medications: You have been prescribed oxycodone . This is a narcotic pain medication and should only be taken as prescribed. You should not drink alcohol  or operate heavy machinery (including driving)  while taking this medication. The oxycodone  can cause constipation as a side effect. For that reason, you have been prescribed senna and miralax. These are both laxatives. You do not need to take this medication if you develop diarrhea. Should you remain constipated even while taking these medications, please increase the dose of miralax to twice daily. Tylenol  has been prescribed to be taken every 8 hours, which will give you additional pain relief. Robaxin is a muscle relaxer that has been prescribed to you for muscle spasm type pain. Take this medication as needed.   You can use over-the-counter NSAIDs (ibuprofen, Aleve, Celebrex, naproxen, meloxicam, etc.) for additional pain relief after this surgery starting 72 hours after surgery. Do not use them before 72 hours. These medications are safe to take with the Tylenol  you have been prescribed. You should not take these medications if you have or have had kidney problems or gastrointestinal ulcers. Take these medications as instructed on the packaging.   In order to set expectations for opioid prescriptions, you will only be prescribed opioids for a total of six weeks after surgery and, at two-weeks after surgery, your opioid prescription will start to tapered (decreased dosage and number of pills). If you have ongoing need for opioid medication six weeks after surgery, you will be referred to pain management. If you are already established with a provider that is giving you opioid medications, you should schedule an appointment with them for six weeks after surgery if you feel you are going to need another prescription. State law only allows for opioid prescriptions one week at a  time. If you are running out of opioid medication near the end of the week, please call the office during business hours before running out so I can send you another prescription.   You may resume any home blood thinners (warfarin, lovenox, apixaban , plavix, xarelto, eliquis , etc)  72 hours after your surgery. Take these medications as they were previously prescribed.  Driving: You should not drive while taking narcotic pain medications. You should start getting back to driving slowly and you may want to try driving in a parking lot before doing anything more.   Diet: You are safe to resume your regular diet after surgery.   Reasons to Call the Office After Surgery: You should feel free to call the office with any concerns or questions you have in the post-operative period, but you should definitely notify the office if you develop: -shortness of breath, chest pain, or trouble breathing -excessive bleeding, drainage, redness, or swelling around the surgical site -fevers, chills, or pain that is getting worse with each passing day -persistent nausea or vomiting -new weakness in either leg -new or worsening numbness or tingling in either leg -numbness in the groin, bowel or bladder incontinence -other concerns about your surgery  Follow Up Appointments: You should have an office appointment scheduled for approximately two weeks after surgery. If you do not remember when this appointment is or do not already have it scheduled, please call the office to schedule.   Office Information:  -Colette Davies, MD -Phone number: [phone] -Address: [address]       [place], [place] [address]

## 2023-08-12 NOTE — Discharge Summary (Signed)
 Orthopedic Surgery Discharge Summary  Patient name: Robert Bowers Patient MRN: [id number] Admit today: 08/11/2023 Discharge date: 08/12/2023  Attending physician: Colette Davies, MD Final diagnosis: lumbar stenosis with neurogenic claudication Findings: L4/5 hypertrophic facets and thickened ligamentum flavum   Hospital course: Patient is a 89 y.o. male who was admitted after undergoing L4/5 laminectomy. The patient had significant pain immediately after surgery, but pain eventually was controlled with a multimodal regimen including oxycodone . Labs during the hospitalization revealed no significant anemia or electrolyte abnormalities. His creatinine was elevated consistent with his chronic kidney disease stage 3a - no change from his baseline creatinine. The patient worked with physical therapy who recommended discharge to home. The patient was tolerating an oral diet without issue and was voiding spontaneously after surgery. The patient's vitals were stable on the day of discharge. The patient was medically ready for discharge and was discharge to home on post-operative day one.  Instructions:   Orthopedic Surgery Discharge Instructions  Patient name: Robert Bowers Procedure Performed: L4/5 laminectomy Date of Surgery: 08/11/2023 Surgeon: Colette Davies, MD  Pre-operative Diagnosis: lumbar stenosis with neurogenic claudication Post-operative Diagnosis: same as above  Discharge Date: 08/12/2023 Discharged to: home Discharge Condition: stable  Activity: You should refrain from bending, lifting, or twisting with objects greater than ten pounds until six weeks after surgery. You are encouraged to walk as much as desired. You can perform household activities such as cleaning dishes, doing laundry, vacuuming, etc. as long as the ten-pound restriction is followed. You do not need to wear a brace during the post-operative period.   Incision Care: Your incision site has a dressing over it. That  dressing should remain in place and dry at all times for a total of one week after surgery. After one week, you can remove the dressing. Underneath the dressing, you will find skin glue. You should leave this skin glue in place. It will fall off with time. Do not pick, rub, or scrub at it. Do not put cream or lotion over the surgical area. After one week and once the dressing is off, it is okay to let soap and water run over your incision. Again, do not pick, scrub, or rub at the skin glue when bathing. Do not submerge (e.g., take a bath, swim, go in a hot tub, etc.) until six weeks after surgery. There may be some bloody drainage from the incision into the dressing after surgery. This is normal. You do not need to replace the dressing. Continue to leave it in place for the one week as instructed above. Should the dressing become saturated with blood or drainage, please call the office for further instructions.   Medications: You have been prescribed oxycodone . This is a narcotic pain medication and should only be taken as prescribed. You should not drink alcohol  or operate heavy machinery (including driving) while taking this medication. The oxycodone  can cause constipation as a side effect. For that reason, you have been prescribed senna and miralax. These are both laxatives. You do not need to take this medication if you develop diarrhea. Should you remain constipated even while taking these medications, please increase the dose of miralax to twice daily. Tylenol  has been prescribed to be taken every 8 hours, which will give you additional pain relief. Robaxin is a muscle relaxer that has been prescribed to you for muscle spasm type pain. Take this medication as needed.   You can use over-the-counter NSAIDs (ibuprofen, Aleve, Celebrex, naproxen, meloxicam, etc.) for additional pain  relief after this surgery starting 72 hours after surgery. Do not use them before 72 hours. These medications are safe to take  with the Tylenol  you have been prescribed. You should not take these medications if you have or have had kidney problems or gastrointestinal ulcers. Take these medications as instructed on the packaging.   In order to set expectations for opioid prescriptions, you will only be prescribed opioids for a total of six weeks after surgery and, at two-weeks after surgery, your opioid prescription will start to tapered (decreased dosage and number of pills). If you have ongoing need for opioid medication six weeks after surgery, you will be referred to pain management. If you are already established with a provider that is giving you opioid medications, you should schedule an appointment with them for six weeks after surgery if you feel you are going to need another prescription. State law only allows for opioid prescriptions one week at a time. If you are running out of opioid medication near the end of the week, please call the office during business hours before running out so I can send you another prescription.   You may resume any home blood thinners (warfarin, lovenox, apixaban , plavix, xarelto, eliquis , etc) 72 hours after your surgery. Take these medications as they were previously prescribed.  Driving: You should not drive while taking narcotic pain medications. You should start getting back to driving slowly and you may want to try driving in a parking lot before doing anything more.   Diet: You are safe to resume your regular diet after surgery.   Reasons to Call the Office After Surgery: You should feel free to call the office with any concerns or questions you have in the post-operative period, but you should definitely notify the office if you develop: -shortness of breath, chest pain, or trouble breathing -excessive bleeding, drainage, redness, or swelling around the surgical site -fevers, chills, or pain that is getting worse with each passing day -persistent nausea or vomiting -new weakness  in either leg -new or worsening numbness or tingling in either leg -numbness in the groin, bowel or bladder incontinence -other concerns about your surgery  Follow Up Appointments: You should have an office appointment scheduled for approximately two weeks after surgery. If you do not remember when this appointment is or do not already have it scheduled, please call the office to schedule.   Office Information:  -Colette Davies, MD -Phone number: [phone] -Address: [address]       [place], [place] [address]

## 2023-08-12 NOTE — Progress Notes (Signed)
Patient is discharged from room 3C11 at this time. Alert and in stable condition. IV site d/c'd and instructions read to patient and spouse with understanding verbalized and all questions answered. Left unit via wheelchair with all belongings at side. 

## 2023-08-12 NOTE — Progress Notes (Signed)
 Occupational Therapy Evaluation Patient Details Name: Robert Bowers MRN: [id number] DOB: 01-13-1935 Today's Date: 08/12/2023   History of Present Illness   Pt is a 89 yr old male who presented 08/11/23 due to lumbar stenosis. Pt s/p L4/5 lamiectomy. PMH: CKD, HTN, HLD, GERD, OSA, COPD, L shoulder sx, hernia repair.     Clinical Impressions Pt reported at PLOF they were ambulating with no DME but noted over last 4-5 months decrease in mobility due to pain. He was educated about precautions and handout given to family. Pt was able to complete UE dressing post set up and supervision for LE for cues. Pt then completed ambulation and steps with rail on R side with CGA due to pt feeling a little loopy due to medications. At this time Acute Occupational Therapy singing off. Recommendation for OP PT.      If plan is discharge home, recommend the following:   Help with stairs or ramp for entrance;Assistance with cooking/housework     Functional Status Assessment   Patient has had a recent decline in their functional status and demonstrates the ability to make significant improvements in function in a reasonable and predictable amount of time.     Equipment Recommendations   None recommended by OT     Recommendations for Other Services         Precautions/Restrictions   Precautions Precautions: Back Precaution Booklet Issued: Yes (comment) Recall of Precautions/Restrictions: Intact Restrictions Weight Bearing Restrictions Per Provider Order: No (Simultaneous filing. User may not have seen previous data.)     Mobility Bed Mobility Overal bed mobility: Modified Independent             General bed mobility comments: completed log roll with no cues but used bed rail    Transfers Overall transfer level: Needs assistance Equipment used: Rolling walker (2 wheels) Transfers: Sit to/from Stand Sit to Stand: Supervision                  Balance Overall balance  assessment: Needs assistance Sitting-balance support: Feet supported Sitting balance-Leahy Scale: Good     Standing balance support: Bilateral upper extremity supported, No upper extremity supported Standing balance-Leahy Scale: Fair Standing balance comment: Pt does better with BUE support as feeling a little off due to medications                           ADL either performed or assessed with clinical judgement   ADL Overall ADL's : Needs assistance/impaired Eating/Feeding: Independent;Sitting   Grooming: Wash/dry face;Supervision/safety;Standing   Upper Body Bathing: Set up;Sitting   Lower Body Bathing: Supervison/ safety;Sit to/from stand   Upper Body Dressing : Set up;Sitting   Lower Body Dressing: Supervision/safety;Sit to/from stand   Toilet Transfer: Supervision/safety   Toileting- Architect and Hygiene: Supervision/safety   Tub/ Optometrist guard assist   Functional mobility during ADLs: Contact guard assist;Rolling walker (2 wheels)       Vision Baseline Vision/History: 1 Wears glasses Ability to See in Adequate Light: 0 Adequate Patient Visual Report: No change from baseline Vision Assessment?: Wears glasses for driving;Wears glasses for reading     Perception Perception: Within Functional Limits       Praxis Praxis: WFL       Pertinent Vitals/Pain Pain Assessment Pain Assessment: Faces Faces Pain Scale: Hurts a little bit Pain Location: back Pain Descriptors / Indicators: Discomfort Pain Intervention(s): Limited activity within patient's tolerance, Monitored during session, Repositioned  Extremity/Trunk Assessment Upper Extremity Assessment Upper Extremity Assessment: Overall WFL for tasks assessed [facility] but has had sx to L shoulder and elbow and has had some limitations)   Lower Extremity Assessment Lower Extremity Assessment: Generalized weakness   Cervical / Trunk Assessment Cervical / Trunk  Assessment: Back Surgery   Communication Communication Communication: No apparent difficulties   Cognition Arousal: Alert Behavior During Therapy: WFL for tasks assessed/performed Cognition: No apparent impairments                               Following commands: Intact       Cueing  General Comments   Cueing Techniques: Verbal cues      Exercises     Shoulder Instructions      Home Living Family/patient expects to be discharged to:: Private residence Living Arrangements: Spouse/significant other Available Help at Discharge: Family Type of Home: House Home Access: Stairs to enter [employer] of Steps: 3 or 9 Entrance Stairs-Rails: Can reach both (with 9 steps) Home Layout: One level     Bathroom Shower/Tub: Walk-in shower;Tub/shower unit   Bathroom Toilet: Standard Bathroom Accessibility: Yes How Accessible: Accessible via walker Home Equipment: Rolling Walker (2 wheels);Shower seat;Shower seat - built in          Prior Functioning/Environment Prior Level of Function : Independent/Modified Independent             Mobility Comments: Pt reported decline in the last 5 months ADLs Comments: indep    OT Problem List: Pain   OT Treatment/Interventions: Self-care/ADL training;DME and/or AE instruction;Therapeutic activities;Patient/family education;Balance training      OT Goals(Current goals can be found in the care plan section)   Acute Rehab OT Goals Patient Stated Goal: to go home OT Goal Formulation: With patient Time For Goal Achievement: 08/26/23 Potential to Achieve Goals: Good   OT Frequency:  Min 1X/week    Co-evaluation              AM-PAC OT 6 Clicks Daily Activity     Outcome Measure Help from another person eating meals?: None Help from another person taking care of personal grooming?: None Help from another person toileting, which includes using toliet, bedpan, or urinal?: None Help from another  person bathing (including washing, rinsing, drying)?: None Help from another person to put on and taking off regular upper body clothing?: None Help from another person to put on and taking off regular lower body clothing?: None 6 Click Score: 24   End of Session Equipment Utilized During Treatment: Gait belt;Rolling walker (2 wheels) Nurse Communication: Mobility status  Activity Tolerance: Patient tolerated treatment well Patient left: with call bell/phone within reach;with family/visitor present;in bed  OT Visit Diagnosis: Pain Pain - part of body:  (back)                Time: 4098-1191 OT Time Calculation (min): 50 min Charges:  OT General Charges $OT Visit: 1 Visit OT Evaluation $OT Eval Low Complexity: 1 Low OT Treatments $Self Care/Home Management : 23-37 mins  Erving Heather OTR/L  Acute Rehab Services  [phone] office number   Stevphen Elders 08/12/2023, 8:36 AM

## 2023-08-12 NOTE — Progress Notes (Signed)
 Orthopedic Surgery Post-operative Progress Note  Assessment: Patient is a 89 y.o. male who is currently admitted after undergoing L4/5 laminectomy   Plan: -Operative plans complete -Drains: none -Out of bed as tolerated, no brace -No bending/lifting/twisting greater than 10 pounds -OT evaluate and treat -Pain control -Regular diet -No chemoprophylaxis for dvt or antiplatelets for 72 hours after surgery -Ancef  x2 post-operative doses -Anticipate discharge to home today  ___________________________________________________________________________   Subjective: No acute events overnight. Walked the halls and did the stairs with OT this morning. Pain well controlled. No radiating leg pain. Denies paresthesias and numbness.   Objective:  General: no acute distress, appropriate affect Neurologic: alert, answering questions appropriately, following commands Respiratory: unlabored breathing on room air Skin: dressing clear/dry/intact  MSK (spine):  -Strength exam      Right  Left  EHL    5/5  5/5 TA    5/5  5/5 GSC    5/5  5/5 Knee extension  5/5  5/5 Hip flexion   5/5  5/5  -Sensory exam    Sensation intact to light touch in L2-S1 nerve distributions of bilateral lower extremities   Patient name: Robert Bowers Patient MRN: [id number] Date: 08/12/23

## 2023-08-12 NOTE — Plan of Care (Signed)

## 2023-08-12 NOTE — Care Management Obs Status (Signed)
 MEDICARE OBSERVATION STATUS NOTIFICATION   Patient Details  Name: Robert Bowers MRN: [id number] Date of Birth: 1935/02/21   Medicare Observation Status Notification Given:  Yes    Felix Host 08/12/2023, 9:23 AM

## 2023-08-14 ENCOUNTER — Telehealth: Payer: Self-pay | Admitting: *Deleted

## 2023-08-14 NOTE — Telephone Encounter (Signed)
 Tried to call but, call could not be completed at this time

## 2023-08-14 NOTE — Telephone Encounter (Signed)
 I called and advised Blanca Bunch of Dr. Frieda Jew message and she understands.

## 2023-08-14 NOTE — Telephone Encounter (Signed)
 Pt's wife Mareo Portilla called and wanted someone to advise her on her husband who had surgery n Friday and is now running a fever of 100.1. Please call her @ [phone]

## 2023-08-24 ENCOUNTER — Ambulatory Visit: Admitting: Orthopedic Surgery

## 2023-08-24 DIAGNOSIS — Z9889 Other specified postprocedural states: Secondary | ICD-10-CM

## 2023-08-24 NOTE — Progress Notes (Signed)
 Orthopedic Surgery Post-operative Office Visit  Procedure: L4/5 laminectomy Date of Surgery: 08/11/2023 (~2 weeks post-op)  Assessment: Patient is a 89 y.o. who is doing well after surgery. Radiating leg pain has resolved.   Plan: -Operative plans complete -Out of bed as tolerated, no brace -No bending/lifting/twisting greater than 10 pounds -Okay to let soap/water run over incision but do not submerge -Pain management: tylenol  as needed -Return to office in 4 weeks, x-rays needed at next visit: none  ___________________________________________________________________________   Subjective: Patient has been at home since discharge from the hospital.  He has been doing well.  He has been keeping to lighter activities.  He is not having any radiating leg pain.  His back is sore but has been improving.  He is just been using Tylenol  to control his pain for the last week.  He has not noticed any redness or drainage around his incision.  He left the dressing on for the last 2 weeks.  Objective:  General: no acute distress, appropriate affect Neurologic: alert, answering questions appropriately, following commands Respiratory: unlabored breathing on room air Skin: incision is well approximated with no erythema, induration, active/expressible drainage  MSK (spine):  -Strength exam      Left  Right  EHL    5/5  5/5 TA    5/5  5/5 GSC    5/5  5/5 Knee extension  5/5  5/5 Hip flexion   5/5  5/5  -Sensory exam    Sensation intact to light touch in L2-S1 nerve distributions of bilateral lower extremities  Imaging: None obtained at today's visit   Patient name: Robert Bowers Patient MRN: [id number] Date of visit: 08/24/23

## 2023-09-12 DIAGNOSIS — I480 Paroxysmal atrial fibrillation: Secondary | ICD-10-CM | POA: Diagnosis not present

## 2023-09-12 DIAGNOSIS — Z1330 Encounter for screening examination for mental health and behavioral disorders, unspecified: Secondary | ICD-10-CM | POA: Diagnosis not present

## 2023-09-12 DIAGNOSIS — E785 Hyperlipidemia, unspecified: Secondary | ICD-10-CM | POA: Diagnosis not present

## 2023-09-18 DIAGNOSIS — J439 Emphysema, unspecified: Secondary | ICD-10-CM | POA: Diagnosis not present

## 2023-09-18 DIAGNOSIS — R918 Other nonspecific abnormal finding of lung field: Secondary | ICD-10-CM | POA: Diagnosis not present

## 2023-09-27 ENCOUNTER — Ambulatory Visit (INDEPENDENT_AMBULATORY_CARE_PROVIDER_SITE_OTHER): Admitting: Orthopedic Surgery

## 2023-09-27 DIAGNOSIS — Z9889 Other specified postprocedural states: Secondary | ICD-10-CM

## 2023-09-27 NOTE — Progress Notes (Signed)
 Orthopedic Surgery Post-operative Office Visit   Procedure: L4/5 laminectomy Date of Surgery: 08/11/2023 (~6 weeks post-op)   Assessment: Patient is a 89 y.o. who is doing well after surgery. Radiating leg pain has resolved. Has been able to do all activities that he wants without pain     Plan: -Operative plans complete -No spine specific precautions at this time -Okay to submerge wound at this point -Pain management: tylenol  as needed -Return to office in 6 weeks, x-rays needed at next visit: AP/lateral/flex/ex lumbar   ___________________________________________________________________________     Subjective: Patient has noticed significant improvement in his radiating leg pain since surgery.  He is not having any pain going to either leg.  He does have some occasionally back pain particularly on the right side in the lower lumbar region.  He does not notice it related to activity.  It comes on randomly and then resolves.  He is not taking any medication for pain.  He is pleased with how he is doing since surgery.  He has not noticed any redness or drainage around his incision.   Objective:   General: no acute distress, appropriate affect Neurologic: alert, answering questions appropriately, following commands Respiratory: unlabored breathing on room air Skin: incision is well healed with no erythema, induration, active/expressible drainage   MSK (spine):   -Strength exam                                                   Left                  Right   EHL                              5/5                  5/5 TA                                 5/5                  5/5 GSC                             5/5                  5/5 Knee extension            5/5                  5/5 Hip flexion                    5/5                  5/5   -Sensory exam                           Sensation intact to light touch in L2-S1 nerve distributions of bilateral lower extremities    Imaging: None obtained at today's visit     Patient name: Robert Bowers Patient MRN: [id number] Date of visit: 09/27/23

## 2023-10-06 DIAGNOSIS — I480 Paroxysmal atrial fibrillation: Secondary | ICD-10-CM | POA: Diagnosis not present

## 2023-11-08 ENCOUNTER — Other Ambulatory Visit (INDEPENDENT_AMBULATORY_CARE_PROVIDER_SITE_OTHER): Payer: Self-pay

## 2023-11-08 ENCOUNTER — Ambulatory Visit (INDEPENDENT_AMBULATORY_CARE_PROVIDER_SITE_OTHER): Admitting: Orthopedic Surgery

## 2023-11-08 DIAGNOSIS — Z9889 Other specified postprocedural states: Secondary | ICD-10-CM

## 2023-11-08 NOTE — Progress Notes (Signed)
 Orthopedic Surgery Post-operative Office Visit   Procedure: L4/5 laminectomy Date of Surgery: 08/11/2023 (~3 months post-op)   Assessment: Patient is a 89 y.o. who is doing well after surgery     Plan: -Operative plans complete -No spine specific precautions at this time -Okay to submerge wound at this point -Pain management: tylenol  as needed -Return to office in 3 months, x-rays needed at next visit: AP/lateral lumbar   ___________________________________________________________________________     Subjective: Patient continues to do well after surgery.  He is not having any radiating leg pain.  He does have some chronic low back pain that he said he is had for 35 years.  Has not noticed any worsening of it since the surgery.  He has been able to deal with this and finds it tolerable.  He is pleased with how he is doing since surgery.  He is not limited in any of his activities.    Objective:   General: no acute distress, appropriate affect Neurologic: alert, answering questions appropriately, following commands Respiratory: unlabored breathing on room air Skin: incision is well healed   MSK (spine):   -Strength exam                                                   Left                  Right   EHL                              5/5                  5/5 TA                                 5/5                  5/5 GSC                             5/5                  5/5 Knee extension            5/5                  5/5 Hip flexion                    5/5                  5/5   -Sensory exam                           Sensation intact to light touch in L2-S1 nerve distributions of bilateral lower extremities   Imaging: XRs of the lumbar spine from 11/08/2023 were independently reviewed and interpreted, showing disc height loss at L3/4 and L4/5.  No evidence of instability on the flexion/extension views.  Lumbar coronal curvature seen on the AP view that measures 14 degrees  with apex to the right.  No fracture or dislocation seen.    Patient name: Robert Bowers Patient MRN: [id number] Date  of visit: 11/08/23

## 2023-12-20 DIAGNOSIS — G4733 Obstructive sleep apnea (adult) (pediatric): Secondary | ICD-10-CM | POA: Diagnosis not present

## 2023-12-20 DIAGNOSIS — I10 Essential (primary) hypertension: Secondary | ICD-10-CM | POA: Diagnosis not present

## 2023-12-20 DIAGNOSIS — I2089 Other forms of angina pectoris: Secondary | ICD-10-CM | POA: Diagnosis not present

## 2023-12-20 DIAGNOSIS — I4819 Other persistent atrial fibrillation: Secondary | ICD-10-CM | POA: Diagnosis not present

## 2023-12-20 DIAGNOSIS — R001 Bradycardia, unspecified: Secondary | ICD-10-CM | POA: Diagnosis not present

## 2023-12-20 DIAGNOSIS — I480 Paroxysmal atrial fibrillation: Secondary | ICD-10-CM | POA: Diagnosis not present

## 2023-12-25 DIAGNOSIS — I480 Paroxysmal atrial fibrillation: Secondary | ICD-10-CM | POA: Diagnosis not present

## 2023-12-27 DIAGNOSIS — G4719 Other hypersomnia: Secondary | ICD-10-CM | POA: Diagnosis not present

## 2023-12-27 DIAGNOSIS — Z1339 Encounter for screening examination for other mental health and behavioral disorders: Secondary | ICD-10-CM | POA: Diagnosis not present

## 2023-12-29 DIAGNOSIS — Z8249 Family history of ischemic heart disease and other diseases of the circulatory system: Secondary | ICD-10-CM | POA: Diagnosis not present

## 2023-12-29 DIAGNOSIS — M8580 Other specified disorders of bone density and structure, unspecified site: Secondary | ICD-10-CM | POA: Diagnosis not present

## 2023-12-29 DIAGNOSIS — Z7901 Long term (current) use of anticoagulants: Secondary | ICD-10-CM | POA: Diagnosis not present

## 2023-12-29 DIAGNOSIS — N400 Enlarged prostate without lower urinary tract symptoms: Secondary | ICD-10-CM | POA: Diagnosis not present

## 2023-12-29 DIAGNOSIS — I4891 Unspecified atrial fibrillation: Secondary | ICD-10-CM | POA: Diagnosis not present

## 2023-12-29 DIAGNOSIS — J449 Chronic obstructive pulmonary disease, unspecified: Secondary | ICD-10-CM | POA: Diagnosis not present

## 2023-12-29 DIAGNOSIS — D6869 Other thrombophilia: Secondary | ICD-10-CM | POA: Diagnosis not present

## 2023-12-29 DIAGNOSIS — K219 Gastro-esophageal reflux disease without esophagitis: Secondary | ICD-10-CM | POA: Diagnosis not present

## 2023-12-29 DIAGNOSIS — E785 Hyperlipidemia, unspecified: Secondary | ICD-10-CM | POA: Diagnosis not present

## 2024-02-05 ENCOUNTER — Encounter (HOSPITAL_COMMUNITY): Payer: Self-pay | Admitting: General Surgery

## 2024-02-07 ENCOUNTER — Ambulatory Visit (INDEPENDENT_AMBULATORY_CARE_PROVIDER_SITE_OTHER): Admitting: Orthopedic Surgery

## 2024-02-07 ENCOUNTER — Other Ambulatory Visit (INDEPENDENT_AMBULATORY_CARE_PROVIDER_SITE_OTHER)

## 2024-02-07 DIAGNOSIS — Z9889 Other specified postprocedural states: Secondary | ICD-10-CM | POA: Diagnosis not present

## 2024-02-07 NOTE — Progress Notes (Signed)
 Orthopedic Surgery Post-operative Office Visit   Procedure: L4/5 laminectomy Date of Surgery: 08/11/2023 (~6 months post-op)   Assessment: Patient is a 89 y.o. who is doing well after surgery     Plan: -Operative plans complete -No spine specific precautions at this time -Okay to submerge wound at this point -Pain management: tylenol  as needed -Return to office in 6 months, x-rays needed at next visit: AP/lateral lumbar   ___________________________________________________________________________     Subjective: Patient has been doing well.  He is not having any radiating leg pain.  He does notice back pain from time to time.  He said if he stretches out it goes away.  He has no consistent back pain.  He is happy with how he has been doing since surgery.  He is not limited in any of his activities due to pain.   Objective:   General: no acute distress, appropriate affect Neurologic: alert, answering questions appropriately, following commands Respiratory: unlabored breathing on room air Skin: incision is well healed   MSK (spine):   -Strength exam                                                   Left                  Right   EHL                              5/5                  5/5 TA                                 5/5                  5/5 GSC                             5/5                  5/5 Knee extension            5/5                  5/5 Hip flexion                    5/5                  5/5   -Sensory exam                           Sensation intact to light touch in L2-S1 nerve distributions of bilateral lower extremities   Imaging: XRs of the lumbar spine from 02/07/2024 were independently reviewed and interpret, showing a degenerative scoliotic curvature with apex to the right that measures 13 degrees.  No spondylolisthesis seen.  Disc height loss at L3/4 and L4/5.  No fracture or dislocation seen.    Patient name: Robert Bowers Patient MRN:  [id number] Date of visit: 02/07/24  Pre-operative Scores   ODI: 58% VAS leg: 10/10 VAS back: 7/10  6 Month Post-operative Scores  ODI: 14% VAS leg: 0/10 VAS back: 3/10
